# Patient Record
Sex: Female | Born: 1970 | Race: Black or African American | Hispanic: No | Marital: Married | Smoking: Never smoker
Health system: Southern US, Community
[De-identification: ages and names within clinical notes are randomized; demographics above are authoritative.]

## PROBLEM LIST (undated history)

## (undated) ENCOUNTER — Emergency Department (HOSPITAL_COMMUNITY): Admission: EM | Payer: BC Managed Care – PPO | Source: Home / Self Care

## (undated) DIAGNOSIS — D649 Anemia, unspecified: Secondary | ICD-10-CM

## (undated) DIAGNOSIS — D219 Benign neoplasm of connective and other soft tissue, unspecified: Secondary | ICD-10-CM

## (undated) DIAGNOSIS — J45909 Unspecified asthma, uncomplicated: Secondary | ICD-10-CM

## (undated) DIAGNOSIS — Z9109 Other allergy status, other than to drugs and biological substances: Secondary | ICD-10-CM

## (undated) HISTORY — PX: LEG SURGERY: SHX1003

---

## 2014-08-19 ENCOUNTER — Emergency Department (INDEPENDENT_AMBULATORY_CARE_PROVIDER_SITE_OTHER)
Admission: EM | Admit: 2014-08-19 | Discharge: 2014-08-19 | Disposition: A | Discharge status: Home / Self Care | Payer: PRIVATE HEALTH INSURANCE | Source: Home / Self Care | Attending: Family Medicine | Admitting: Family Medicine

## 2014-08-19 ENCOUNTER — Encounter (HOSPITAL_COMMUNITY): Payer: Self-pay | Admitting: Emergency Medicine

## 2014-08-19 DIAGNOSIS — R21 Rash and other nonspecific skin eruption: Secondary | ICD-10-CM

## 2014-08-19 HISTORY — DX: Other allergy status, other than to drugs and biological substances: Z91.09

## 2014-08-19 MED ORDER — HYDROCORTISONE 2.5 % EX CREA: TOPICAL_CREAM | Freq: Two times a day (BID) | CUTANEOUS | Status: DC

## 2014-08-19 MED ORDER — CETIRIZINE HCL 10 MG PO TABS: 10.0000 mg | ORAL_TABLET | Freq: Every day | ORAL | Status: DC

## 2014-08-19 NOTE — ED Provider Notes (Signed)
CSN: [id number]     Arrival date & time 08/19/14  1458 History   First MD Initiated Contact with Patient 08/19/14 1542     Chief Complaint  Patient presents with  . Rash   (Consider location/radiation/quality/duration/timing/severity/associated sxs/prior Treatment) HPI  Rash:  4 years ago developed an "allergy" when imigrated to [place] after fleeing [place]. Became so severe that she was unable to open eyes. Associated w/ allergies. Rash started on face adn neck, no further spread. Pt went to the doctor in [place] and was treated w/ a cream w/ resolution. Pts concern today is that this may become worse. Started to flare again 3-4 days ago. Daughter w/ similar symptoms.   No involvement of the hadns or feet. No others affected at home.    History reviewed. No pertinent past medical history. History reviewed. No pertinent past surgical history. History reviewed. No pertinent family history. History  Substance Use Topics  . Smoking status: Never Smoker   . Smokeless tobacco: Not on file  . Alcohol Use: No   OB History    No data available     Review of Systems Per HPI with all other pertinent systems negative.    Allergies  Review of patient's allergies indicates no known allergies.  Home Medications   Prior to Admission medications   Medication Sig Start Date End Date Taking? Authorizing Provider  cetirizine (ZYRTEC) 10 MG tablet Take 1 tablet (10 mg total) by mouth daily. 08/19/14   Waldemar Dickens, MD  hydrocortisone 2.5 % cream Apply topically 2 (two) times daily. 08/19/14   Waldemar Dickens, MD   BP 117/69 mmHg  Pulse 76  Temp(Src) 98.8 F (37.1 C) (Oral)  Resp 16  SpO2 100%  LMP  (LMP Unknown) Physical Exam  Constitutional: She is oriented to person, place, and time. She appears well-developed and well-nourished. No distress.  HENT:  Head: Normocephalic and atraumatic.  Eyes: EOM are normal. Pupils are equal, round, and reactive to light.  Neck: Normal range of  motion.  Cardiovascular: Normal rate and normal heart sounds.   No murmur heard. Pulmonary/Chest: Effort normal and breath sounds normal.  Abdominal: Soft. She exhibits no distension.  Musculoskeletal: Normal range of motion. She exhibits no edema or tenderness.  Neurological: She is alert and oriented to person, place, and time.  Skin: She is not diaphoretic.  Face w/ few scattered rough patches. No macula or vesicles. Neck and chest clear  Psychiatric: She has a normal mood and affect. Her behavior is normal. Thought content normal.    ED Course  Procedures (including critical care time) Labs Review Labs Reviewed - No data to display  Imaging Review No results found.   MDM   1. Rash    Likely allergic but may be due to eczema.  Scabies is also a possibliity given recent migration to [place] and being in contact w/ numerous other people. Less likely though given multiple othe rfamily members including husband w/o rash.  Hydrocortisone 2% cream Zyrtec F/u adn establish care at HD or [facility] center.  Precautions given and all questions answered  Linna Darner, MD Family Medicine 08/19/2014, 4:42 PM      Waldemar Dickens, MD 08/19/14 [phone]

## 2014-08-19 NOTE — Discharge Instructions (Signed)
Your symptoms are likely from an allergic reaction to your suroundings and eczema, but may be related to scabies.  Please start taking the pill and using the cream. If your symptoms do not improve, consider being seen for scabies. Please know that the cream may temporarily lighten your skin. Only use the skin for 1 week at a time and then give your skin a 1 week break. Please consider calling the Health Department or the [facility] and [facility] [phone]) for further follow-up.   Vos symptmes sont susceptibles d'une raction allergique  vos suroundings et l'eczma , Rae Roam peuvent tre lies  la [place] . S'il vous plat commencer  prendre la pilule et [employer] crme . Si vos symptmes ne amliorent pas , envisager d'tre vu pour [employer] . S'il vous plat savoir que la crme Product/process development scientist votre peau. Utiliser seulement la peau pendant 1 semaine  la fois , puis donner  votre peau une pause de 1 semaine. S'il vous plat envisager d'appeler le ministre de la sant ou de la [facility] [phone]) pour plus de suivi .

## 2014-08-19 NOTE — ED Notes (Signed)
C/o rash on face and bilateral eye irritation x 3 days.  Denies fever, n/v/d

## 2014-10-01 ENCOUNTER — Emergency Department (INDEPENDENT_AMBULATORY_CARE_PROVIDER_SITE_OTHER)
Admission: EM | Admit: 2014-10-01 | Discharge: 2014-10-01 | Disposition: A | Discharge status: Home / Self Care | Payer: PRIVATE HEALTH INSURANCE | Source: Home / Self Care | Attending: Family Medicine | Admitting: Family Medicine

## 2014-10-01 ENCOUNTER — Encounter (HOSPITAL_COMMUNITY): Payer: Self-pay | Admitting: Emergency Medicine

## 2014-10-01 DIAGNOSIS — J069 Acute upper respiratory infection, unspecified: Secondary | ICD-10-CM

## 2014-10-01 LAB — POCT RAPID STREP A: Streptococcus, Group A Screen (Direct): NEGATIVE

## 2014-10-01 MED ORDER — IPRATROPIUM BROMIDE 0.06 % NA SOLN: 2.0000 | Freq: Four times a day (QID) | NASAL | Status: DC

## 2014-10-01 MED ORDER — DICLOFENAC SODIUM 50 MG PO TBEC: 50.0000 mg | DELAYED_RELEASE_TABLET | Freq: Two times a day (BID) | ORAL | Status: DC | PRN

## 2014-10-01 MED ORDER — METHYLPREDNISOLONE ACETATE 80 MG/ML IJ SUSP
80.0000 mg | Freq: Once | INTRAMUSCULAR | Status: AC
  Administered 2014-10-01: 80 mg via INTRAMUSCULAR

## 2014-10-01 MED ORDER — METHYLPREDNISOLONE ACETATE 80 MG/ML IJ SUSP
INTRAMUSCULAR | Status: AC
  Filled 2014-10-01: qty 1

## 2014-10-01 NOTE — ED Provider Notes (Addendum)
Karen Gibbs is a 44 y.o. female who presents to Urgent Care today for sore throat fever headache and fatigue. Symptoms present for 4 days. No vomiting diarrhea abdominal pain or chest pain. Patient has not tried any medications yet.    Past Medical History  Diagnosis Date  . Environmental allergies    History reviewed. No pertinent past surgical history. History  Substance Use Topics  . Smoking status: Never Smoker   . Smokeless tobacco: Not on file  . Alcohol Use: No   ROS as above Medications: No current facility-administered medications for this encounter.   Current Outpatient Prescriptions  Medication Sig Dispense Refill  . diclofenac (VOLTAREN) 50 MG EC tablet Take 1 tablet (50 mg total) by mouth 2 (two) times daily as needed. 60 tablet 0  . ipratropium (ATROVENT) 0.06 % nasal spray Place 2 sprays into both nostrils 4 (four) times daily. 15 mL 1  . [DISCONTINUED] cetirizine (ZYRTEC) 10 MG tablet Take 1 tablet (10 mg total) by mouth daily. 30 tablet 0   No Known Allergies   Exam:  BP 122/73 mmHg  Pulse 78  Temp(Src) 97.9 F (36.6 C) (Oral)  Resp 14  SpO2 100% Gen: Well NAD nontoxic appearing HEENT: EOMI,  MMM normal posterior pharynx and tympanic membranes. Lungs: Normal work of breathing. CTABL Heart: RRR no MRG Abd: NABS, Soft. Nondistended, Nontender Exts: Brisk capillary refill, warm and well perfused.   Results for orders placed or performed during the hospital encounter of 10/01/14 (from the past 24 hour(s))  POCT rapid strep A [facility])     Status: None   Collection Time: 10/01/14 10:57 AM  Result Value Ref Range   Streptococcus, Group A Screen (Direct) NEGATIVE NEGATIVE   No results found.  Assessment and Plan: 44 y.o. female with viral URI. Treatment with Atrovent nasal spray. We'll also use diclofenac for pain control. Will give 80mg  depomedrol prior to discharge.   Discussed warning signs or symptoms. Please see discharge instructions.  Patient expresses understanding.     Gregor Hams, MD 10/01/14 Karen Alannah Averhart, MD 10/01/14 1153

## 2014-10-01 NOTE — ED Notes (Signed)
C/o cold sx onset 4 days Reports HA, fevers, ST, fatigue, productive cough Denies n/v/d Alert, no signs of acute distress.

## 2014-10-01 NOTE — Discharge Instructions (Signed)
°  Revenez si vous ne Hydrographic surveyor. Allez  la salle d'urgence si vous obtenez pire. Utilisez le nez pulvriser toutes les quatre heures. Prenez les pilules deux fois par jour pour [employer] et la fivre et des maux de tte.

## 2014-10-03 LAB — CULTURE, GROUP A STREP

## 2014-10-08 ENCOUNTER — Other Ambulatory Visit: Payer: Self-pay | Admitting: Internal Medicine

## 2014-10-08 ENCOUNTER — Encounter: Payer: Self-pay | Admitting: Internal Medicine

## 2014-10-08 ENCOUNTER — Ambulatory Visit: Payer: Medicaid Other | Attending: Internal Medicine | Admitting: Internal Medicine

## 2014-10-08 DIAGNOSIS — N938 Other specified abnormal uterine and vaginal bleeding: Secondary | ICD-10-CM | POA: Diagnosis not present

## 2014-10-08 DIAGNOSIS — N644 Mastodynia: Secondary | ICD-10-CM | POA: Diagnosis not present

## 2014-10-08 LAB — POCT URINALYSIS DIPSTICK
Bilirubin, UA: NEGATIVE
Blood, UA: NEGATIVE
Glucose, UA: NEGATIVE
Ketones, UA: NEGATIVE
Leukocytes, UA: NEGATIVE
Nitrite, UA: NEGATIVE
Protein, UA: NEGATIVE
Spec Grav, UA: 1.02
Urobilinogen, UA: 0.2
pH, UA: 7

## 2014-10-08 LAB — COMPLETE METABOLIC PANEL WITH GFR
ALT: 14 U/L (ref 0–35)
AST: 23 U/L (ref 0–37)
Albumin: 3.8 g/dL (ref 3.5–5.2)
Alkaline Phosphatase: 37 U/L — ABNORMAL LOW (ref 39–117)
BUN: 12 mg/dL (ref 6–23)
CO2: 28 mEq/L (ref 19–32)
Calcium: 8.9 mg/dL (ref 8.4–10.5)
Chloride: 104 mEq/L (ref 96–112)
Creat: 0.5 mg/dL (ref 0.50–1.10)
GFR, Est African American: >89 mL/min
GFR, Est Non African American: >89 mL/min
Glucose, Bld: 64 mg/dL — ABNORMAL LOW (ref 70–99)
Potassium: 4.2 mEq/L (ref 3.5–5.3)
Sodium: 138 mEq/L (ref 135–145)
Total Bilirubin: 0.7 mg/dL (ref 0.2–1.2)
Total Protein: 6.6 g/dL (ref 6.0–8.3)

## 2014-10-08 LAB — CBC WITH DIFFERENTIAL/PLATELET
Basophils Absolute: 0.1 10*3/uL (ref 0.0–0.1)
Basophils Relative: 1 % (ref 0–1)
Eosinophils Absolute: 0.1 10*3/uL (ref 0.0–0.7)
Eosinophils Relative: 2 % (ref 0–5)
HCT: 33.9 % — ABNORMAL LOW (ref 36.0–46.0)
Hemoglobin: 10.2 g/dL — ABNORMAL LOW (ref 12.0–15.0)
Lymphocytes Relative: 49 % — ABNORMAL HIGH (ref 12–46)
Lymphs Abs: 3.1 10*3/uL (ref 0.7–4.0)
MCH: 21.7 pg — ABNORMAL LOW (ref 26.0–34.0)
MCHC: 30.1 g/dL (ref 30.0–36.0)
MCV: 72.3 fL — ABNORMAL LOW (ref 78.0–100.0)
MPV: 10.4 fL (ref 8.6–12.4)
Monocytes Absolute: 0.5 10*3/uL (ref 0.1–1.0)
Monocytes Relative: 8 % (ref 3–12)
Neutro Abs: 2.6 10*3/uL (ref 1.7–7.7)
Neutrophils Relative %: 40 % — ABNORMAL LOW (ref 43–77)
Platelets: 256 10*3/uL (ref 150–400)
RBC: 4.69 MIL/uL (ref 3.87–5.11)
RDW: 17.2 % — ABNORMAL HIGH (ref 11.5–15.5)
WBC: 6.4 10*3/uL (ref 4.0–10.5)

## 2014-10-08 NOTE — Progress Notes (Signed)
Pt is here today to establish care. Pt states that for about 2 month her left breast has been very painful. Pt also claims that her stomach hurts. Pt has an interpreter.

## 2014-10-08 NOTE — Progress Notes (Signed)
Patient ID: Karen Gibbs, female   DOB: 08/30/1971, 44 y.o.   MRN: [id number]  DDU:[id number]  [id number]  DOB - [birth year]  CC:  Chief Complaint  Patient presents with  . Establish Care       HPI: Karen Gibbs is a 44 y.o. female here today to establish medical care.  [place] two [place] ago. She has no past medical history.  She presents to clinic today for evaluation of left breast pain that she has had since 2007.  She reports that she was on antibiotic in the past for her breast but is unsure why.  She know that she had a "rock" in her breast and some swelling. She has had a "pus" nipple discharge since 2010. Patient reports that she had all of these symptoms while in [place] but was never sent for breast imaging.  Patient states that her mother has a history of "breast disease" but is unsure of exact complications.  She has been taking Naproxen for pain. Patient also complains of abdominal pain. She reports irregular bleeding monthly.  She notes heavy cycles with multiple clot passage.     No Known Allergies Past Medical History  Diagnosis Date  . Environmental allergies    Current Outpatient Prescriptions on File Prior to Visit  Medication Sig Dispense Refill  . ipratropium (ATROVENT) 0.06 % nasal spray Place 2 sprays into both nostrils 4 (four) times daily. 15 mL 1  . diclofenac (VOLTAREN) 50 MG EC tablet Take 1 tablet (50 mg total) by mouth 2 (two) times daily as needed. 60 tablet 0  . [DISCONTINUED] cetirizine (ZYRTEC) 10 MG tablet Take 1 tablet (10 mg total) by mouth daily. 30 tablet 0   No current facility-administered medications on file prior to visit.   History reviewed. No pertinent family history. History   Social History  . Marital Status: Married    Spouse Name: N/A    Number of Children: N/A  . Years of Education: N/A   Occupational History  . Not on file.   Social History Main Topics  . Smoking status: Never Smoker   .  Smokeless tobacco: Not on file  . Alcohol Use: No  . Drug Use: No  . Sexual Activity: Yes   Other Topics Concern  . Not on file   Social History Narrative    Review of Systems: See HPI   Objective:   Filed Vitals:   10/08/14 1159  BP: 118/80  Pulse: 70  Temp: 98 F (36.7 C)  Resp: 16    Physical Exam  Cardiovascular: Normal rate, regular rhythm and normal heart sounds.   Pulmonary/Chest: Effort normal and breath sounds normal. Right breast exhibits mass. Right breast exhibits no nipple discharge and no tenderness. Left breast exhibits mass. Left breast exhibits no tenderness. There is no breast swelling.  Abdominal: Soft. Bowel sounds are normal. There is tenderness.     No results found for: WBC, HGB, HCT, MCV, PLT No results found for: CREATININE, BUN, NA, K, CL, CO2  No results found for: HGBA1C Lipid Panel  No results found for: CHOL, TRIG, HDL, CHOLHDL, VLDL, LDLCALC     Assessment and plan:   Orla was seen today for establish care.  Diagnoses and all orders for this visit:  Breast pain, left Orders: -     MM Digital Diagnostic Bilat; Future  DUB (dysfunctional uterine bleeding) Orders: -     POCT urinalysis dipstick -     US  Abdomen Complete; Future -     Cancel: US OB Transvaginal; Future -     CBC with Differential -     US Pelvis Complete; Future -     US Transvaginal Non-OB; Future -     COMPLETE METABOLIC PANEL WITH GFR -     TSH   Interpreter was used to communicate directly with patient for the entire encounter including providing detailed patient instructions.    Return if symptoms worsen or fail to improve.    Chari Manning, NP-C [facility] [phone] 10/08/2014, 12:19 PM

## 2014-10-09 LAB — TSH: TSH: 1.376 u[IU]/mL (ref 0.350–4.500)

## 2014-10-13 ENCOUNTER — Ambulatory Visit
Admission: RE | Admit: 2014-10-13 | Discharge: 2014-10-13 | Disposition: A | Discharge status: Home / Self Care | Payer: Medicaid Other | Source: Ambulatory Visit | Attending: Internal Medicine | Admitting: Internal Medicine

## 2014-10-13 ENCOUNTER — Other Ambulatory Visit: Payer: Self-pay | Admitting: Internal Medicine

## 2014-10-13 DIAGNOSIS — N644 Mastodynia: Secondary | ICD-10-CM

## 2014-10-15 ENCOUNTER — Ambulatory Visit (HOSPITAL_COMMUNITY)
Admission: RE | Admit: 2014-10-15 | Discharge: 2014-10-15 | Disposition: A | Discharge status: Home / Self Care | Payer: Medicaid Other | Source: Ambulatory Visit | Attending: Internal Medicine | Admitting: Internal Medicine

## 2014-10-15 DIAGNOSIS — N938 Other specified abnormal uterine and vaginal bleeding: Secondary | ICD-10-CM

## 2014-10-15 DIAGNOSIS — N920 Excessive and frequent menstruation with regular cycle: Secondary | ICD-10-CM | POA: Diagnosis present

## 2014-10-15 DIAGNOSIS — N852 Hypertrophy of uterus: Secondary | ICD-10-CM | POA: Insufficient documentation

## 2014-10-15 DIAGNOSIS — R938 Abnormal findings on diagnostic imaging of other specified body structures: Secondary | ICD-10-CM | POA: Diagnosis not present

## 2014-10-19 ENCOUNTER — Telehealth: Payer: Self-pay | Admitting: *Deleted

## 2014-10-19 DIAGNOSIS — N921 Excessive and frequent menstruation with irregular cycle: Secondary | ICD-10-CM

## 2014-10-19 NOTE — Telephone Encounter (Signed)
-----   Message from Lance Bosch, NP sent at 10/15/2014 10:45 PM EST ----- . Ultrasound negative for lesions or fibroids. If bleeding persist she may require referral to GYN

## 2014-10-19 NOTE — Telephone Encounter (Signed)
Used [employer] [place] # [id number]  Pt aware of results Stated want to be referral to GYN  Continue with heavy bleeding and pain with menses

## 2014-10-19 NOTE — Telephone Encounter (Signed)
May send referral.

## 2014-10-19 NOTE — Telephone Encounter (Signed)
-----   Message from Lance Bosch, NP sent at 10/15/2014 10:49 PM EST ----- Probable lymph node found. Looks non cancerous but she will need a repeat ultrasound in 6 months of right breast

## 2014-10-20 NOTE — Telephone Encounter (Signed)
Referral placed.

## 2014-10-20 NOTE — Addendum Note (Signed)
Addended by: Betti Cruz on: 10/20/2014 04:14 PM   Modules accepted: Orders

## 2014-10-21 ENCOUNTER — Encounter: Payer: Self-pay | Admitting: Obstetrics & Gynecology

## 2014-10-25 ENCOUNTER — Telehealth: Payer: Self-pay | Admitting: Emergency Medicine

## 2014-10-25 MED ORDER — FERROUS SULFATE 325 (65 FE) MG PO TABS: 325.0000 mg | ORAL_TABLET | Freq: Every day | ORAL | Status: DC

## 2014-10-25 NOTE — Telephone Encounter (Signed)
Pt given lab results with medication instructions to start taking Ferrous Sulfate 325 mg tab po daily  Medication e-scribed to [place] interpretor used for [place] language [phone]

## 2014-10-25 NOTE — Telephone Encounter (Signed)
-----   Message from Lance Bosch, NP sent at 10/25/2014 10:55 AM EST ----- Iron deficiency anemia. Please send ferrous sulfate 325 mg PO daily. Send 2 refills. Explain that medication will help with blood levels and may improve energy levels.

## 2014-11-25 ENCOUNTER — Encounter: Payer: Self-pay | Admitting: Obstetrics & Gynecology

## 2014-11-25 ENCOUNTER — Other Ambulatory Visit (HOSPITAL_COMMUNITY)
Admission: RE | Admit: 2014-11-25 | Discharge: 2014-11-25 | Disposition: A | Discharge status: Home / Self Care | Payer: Medicaid Other | Source: Ambulatory Visit | Attending: Obstetrics & Gynecology | Admitting: Obstetrics & Gynecology

## 2014-11-25 ENCOUNTER — Ambulatory Visit (INDEPENDENT_AMBULATORY_CARE_PROVIDER_SITE_OTHER): Payer: PRIVATE HEALTH INSURANCE | Admitting: Obstetrics & Gynecology

## 2014-11-25 VITALS — BP 123/61 | HR 67 | Temp 97.9°F | Ht 61.81 in | Wt 173.7 lb

## 2014-11-25 DIAGNOSIS — Z01419 Encounter for gynecological examination (general) (routine) without abnormal findings: Secondary | ICD-10-CM | POA: Diagnosis not present

## 2014-11-25 DIAGNOSIS — Z1151 Encounter for screening for human papillomavirus (HPV): Secondary | ICD-10-CM | POA: Diagnosis present

## 2014-11-25 DIAGNOSIS — N920 Excessive and frequent menstruation with regular cycle: Secondary | ICD-10-CM | POA: Diagnosis not present

## 2014-11-25 DIAGNOSIS — N946 Dysmenorrhea, unspecified: Secondary | ICD-10-CM | POA: Insufficient documentation

## 2014-11-25 MED ORDER — IBUPROFEN 800 MG PO TABS: 800.0000 mg | ORAL_TABLET | Freq: Three times a day (TID) | ORAL | Status: DC | PRN

## 2014-11-25 NOTE — Progress Notes (Signed)
Patient ID: Karen Gibbs, female   DOB: 25-Jun-1971, 44 y.o.   MRN: [id number]  Chief Complaint  Patient presents with  . Referral  . Menorrhagia    for last year    HPI Karen Gibbs is a 44 y.o. female.  G4P4,Patient's last menstrual period was 11/18/2014. Heavy painful periods. On no meds, being followed for abnl mammogram  HPI  Past Medical History  Diagnosis Date  . Environmental allergies     Past Surgical History  Procedure Laterality Date  . Cesarean section      No family history on file.  Social History History  Substance Use Topics  . Smoking status: Never Smoker   . Smokeless tobacco: Not on file  . Alcohol Use: No    No Known Allergies  Current Outpatient Prescriptions  Medication Sig Dispense Refill  . ferrous sulfate 325 (65 FE) MG tablet Take 1 tablet (325 mg total) by mouth daily with breakfast. 30 tablet 2  . [DISCONTINUED] cetirizine (ZYRTEC) 10 MG tablet Take 1 tablet (10 mg total) by mouth daily. 30 tablet 0   No current facility-administered medications for this visit.    Review of Systems Review of Systems  Constitutional: Negative.   Genitourinary: Positive for frequency, menstrual problem and pelvic pain. Negative for dysuria, urgency, hematuria, vaginal bleeding and vaginal discharge.    Blood pressure 123/61, pulse 67, temperature 97.9 F (36.6 C), temperature source Oral, height 5' 1.81" (1.57 m), weight 173 lb 11.2 oz (78.79 kg), last menstrual period 11/18/2014.  Physical Exam Physical Exam  Constitutional: She is oriented to person, place, and time. She appears well-developed. No distress.  Pulmonary/Chest: Effort normal. No respiratory distress.  Genitourinary: Vagina normal and uterus normal. No vaginal discharge found.  Pap done, mild tenderness left adnexa no mass  Neurological: She is alert and oriented to person, place, and time.  Psychiatric: She has a normal mood and affect. Her behavior is normal.    Data  Reviewed  CLINICAL DATA: Prolonged periods with pelvic pain  EXAM: TRANSABDOMINAL AND TRANSVAGINAL ULTRASOUND OF PELVIS  TECHNIQUE: Both transabdominal and transvaginal ultrasound examinations of the pelvis were performed. Transabdominal technique was performed for global imaging of the pelvis including uterus, ovaries, adnexal regions, and pelvic cul-de-sac. It was necessary to proceed with endovaginal exam following the transabdominal exam to visualize the uterus and adnexal structures.  COMPARISON: None  FINDINGS: Uterus  Measurements: 12.7 x 5.8 x 7.4 cm. No fibroids or other mass visualized.  Endometrium  Thickness: Variable thickness up to 20 mm. No focal abnormality visualized.  Right ovary  Measurements: 3.7 x 3.5 x 2.3 cm. Normal appearance/no adnexal mass.  Left ovary  Measurements: 3.7 x 1.9 x 3.7 cm. Normal appearance/no adnexal mass.  Other findings  No free fluid.  IMPRESSION: 1. Mild uterine enlargement without sonographic evidence of leiomyomas. There is endometrial thickening to 20 mm. No endometrial fluid collections or polyps are demonstrated. If bleeding remains unresponsive to hormonal or medical therapy, focal lesion work-up with sonohysterogram should be considered. Endometrial biopsy should also be considered in pre-menopausal patients at high risk for endometrial carcinoma. (Ref: Radiological Reasoning: Algorithmic Workup of Abnormal Vaginal Bleeding with Endovaginal Sonography and Sonohysterography. AJR 2008; 465:K81-27) 2. The ovaries are normal in size and echotexture.   Electronically Signed  By: David Martinique  On: 10/15/2014 10:20        Assessment    Menorrhagia dysmenorrhea     Plan    Wants to avoid hormones, will Rx Ibuprofen 800  mg TID during menses and RTC 3 months        Gregoria Selvy 11/25/2014, 2:33 PM

## 2014-11-25 NOTE — Progress Notes (Signed)
Fabienne Bruns used for interpreter

## 2014-11-25 NOTE — Patient Instructions (Signed)

## 2014-11-29 LAB — CYTOLOGY - PAP

## 2014-12-16 ENCOUNTER — Other Ambulatory Visit: Payer: Self-pay | Admitting: Infectious Disease

## 2014-12-16 ENCOUNTER — Ambulatory Visit
Admission: RE | Admit: 2014-12-16 | Discharge: 2014-12-16 | Disposition: A | Discharge status: Home / Self Care | Payer: No Typology Code available for payment source | Source: Ambulatory Visit | Attending: Infectious Disease | Admitting: Infectious Disease

## 2014-12-16 DIAGNOSIS — R7611 Nonspecific reaction to tuberculin skin test without active tuberculosis: Secondary | ICD-10-CM

## 2014-12-27 ENCOUNTER — Encounter: Payer: Self-pay | Admitting: Family Medicine

## 2014-12-27 ENCOUNTER — Ambulatory Visit: Payer: Medicaid Other | Attending: Internal Medicine | Admitting: Family Medicine

## 2014-12-27 VITALS — BP 126/81 | HR 82 | Temp 98.0°F | Resp 18 | Ht 62.5 in | Wt 180.8 lb

## 2014-12-27 DIAGNOSIS — X58XXXA Exposure to other specified factors, initial encounter: Secondary | ICD-10-CM | POA: Diagnosis not present

## 2014-12-27 DIAGNOSIS — R21 Rash and other nonspecific skin eruption: Secondary | ICD-10-CM | POA: Diagnosis present

## 2014-12-27 DIAGNOSIS — H578 Other specified disorders of eye and adnexa: Secondary | ICD-10-CM | POA: Insufficient documentation

## 2014-12-27 DIAGNOSIS — T7840XA Allergy, unspecified, initial encounter: Secondary | ICD-10-CM | POA: Insufficient documentation

## 2014-12-27 DIAGNOSIS — T7840XD Allergy, unspecified, subsequent encounter: Secondary | ICD-10-CM | POA: Diagnosis not present

## 2014-12-27 MED ORDER — FEXOFENADINE HCL 180 MG PO TABS: 180.0000 mg | ORAL_TABLET | Freq: Every day | ORAL | Status: DC

## 2014-12-27 NOTE — Progress Notes (Signed)
Patient presents with itchy, watery eyes since 12/24/14. Patient also complaining that her face has been itching since 12/24/14 as well. [employer] used for translation # [id number]

## 2014-12-27 NOTE — Patient Instructions (Signed)
May use over the counter allery eye drops like Visine alllergy or Opthcon allergy May use over the counter hydrocortisone cream twice a day for 2 weeks. If fexofenadine not working well may use benadryl over the counter Follow-up as needed

## 2014-12-27 NOTE — Progress Notes (Signed)
Subjective:     Patient ID: Karen Gibbs, female   DOB: 30-Sep-1970, 44 y.o.   MRN: [id number]  HPI   Patient presents with a several day history of "rash" on face and neck and watery itchy eyes. She had a previous episode of this in December. She has not determine what might be the cause. She did no find betamethzone and Zyrtec helpful but it did resolve but has returned recently. She denies any swelling or difficulty breathing.   Review of Systems See HPI    Objective:   Physical Exam She is alert, oriented, appropriate, in do distress. Not being familiar with her skin, I am finding no significant rash on her face or neck. Her conjunctiva is not injected.    Assessment:     Allergic reaction    Plan:     Trial of Allegra 180 mg. 0ne po q day. May use OTC allergy eye dropss If allegra not sufficient, may discontinue and use benadryl per bottle instructions.

## 2015-01-10 ENCOUNTER — Ambulatory Visit: Payer: Medicaid Other | Attending: Internal Medicine | Admitting: Internal Medicine

## 2015-01-10 ENCOUNTER — Encounter: Payer: Self-pay | Admitting: Internal Medicine

## 2015-01-10 VITALS — BP 129/83 | HR 73 | Temp 98.3°F | Resp 16

## 2015-01-10 DIAGNOSIS — R21 Rash and other nonspecific skin eruption: Secondary | ICD-10-CM | POA: Insufficient documentation

## 2015-01-10 DIAGNOSIS — J302 Other seasonal allergic rhinitis: Secondary | ICD-10-CM | POA: Insufficient documentation

## 2015-01-10 MED ORDER — LORATADINE 10 MG PO TABS: 10.0000 mg | ORAL_TABLET | Freq: Every day | ORAL | Status: DC

## 2015-01-10 NOTE — Patient Instructions (Signed)
Stop Fexpfenadine and switch to claritin

## 2015-01-10 NOTE — Progress Notes (Signed)
Patient ID: Karen Gibbs, female   DOB: 09-13-1970, 44 y.o.   MRN: [id number]  CC: facial rash/itching  HPI: Karen Gibbs is a 44 y.o. female here today for a follow up visit of a facial rash.  Patient was seen 2 weeks ago for the same facial rash and again 5 months ago. Patient reports that she developed this "allergy" 4 years ago when she migrated to the [place] from [place]. Two weeks ago the patient was told to try allegra once per day and use OTC eye drops. She has not noticed much relief with allegra and continues to report itching. Allegra makes her sleepy for most of the day.    No Known Allergies Past Medical History  Diagnosis Date  . Environmental allergies    Current Outpatient Prescriptions on File Prior to Visit  Medication Sig Dispense Refill  . fexofenadine (ALLEGRA) 180 MG tablet Take 1 tablet (180 mg total) by mouth daily. 30 tablet 1  . ferrous sulfate 325 (65 FE) MG tablet Take 1 tablet (325 mg total) by mouth daily with breakfast. (Patient not taking: Reported on 12/27/2014) 30 tablet 2  . ibuprofen (ADVIL,MOTRIN) 800 MG tablet Take 1 tablet (800 mg total) by mouth every 8 (eight) hours as needed for cramping (during menses). (Patient not taking: Reported on 01/10/2015) 50 tablet 2  . [DISCONTINUED] cetirizine (ZYRTEC) 10 MG tablet Take 1 tablet (10 mg total) by mouth daily. 30 tablet 0   No current facility-administered medications on file prior to visit.   History reviewed. No pertinent family history. History   Social History  . Marital Status: Married    Spouse Name: N/A  . Number of Children: N/A  . Years of Education: N/A   Occupational History  . Not on file.   Social History Main Topics  . Smoking status: Never Smoker   . Smokeless tobacco: Not on file  . Alcohol Use: No  . Drug Use: No  . Sexual Activity: Yes   Other Topics Concern  . Not on file   Social History Narrative    Review of Systems  Eyes:       Eye itching, skin  darkening around eyes  Skin: Positive for itching and rash.  All other systems reviewed and are negative.     Objective:   Filed Vitals:   01/10/15 1415  BP: 129/83  Pulse: 73  Temp: 98.3 F (36.8 C)  Resp: 16    Physical Exam  Constitutional: She is oriented to person, place, and time.  Eyes:  Allergic shiners bilateral   Cardiovascular: Normal rate, regular rhythm and normal heart sounds.   Pulmonary/Chest: Effort normal and breath sounds normal.  Neurological: She is alert and oriented to person, place, and time.  Skin: Skin is warm and dry.     Lab Results  Component Value Date   WBC 6.4 10/08/2014   HGB 10.2* 10/08/2014   HCT 33.9* 10/08/2014   MCV 72.3* 10/08/2014   PLT 256 10/08/2014   Lab Results  Component Value Date   CREATININE 0.50 10/08/2014   BUN 12 10/08/2014   NA 138 10/08/2014   K 4.2 10/08/2014   CL 104 10/08/2014   CO2 28 10/08/2014    No results found for: HGBA1C Lipid Panel  No results found for: CHOL, TRIG, HDL, CHOLHDL, VLDL, LDLCALC     Assessment and plan:   Karen Gibbs was seen today for follow-up.  Diagnoses and all orders for this visit:  Facial rash Orders: -  Ambulatory referral to Allergy  Seasonal allergies Orders: -     Ambulatory referral to Ophthalmology -     loratadine (CLARITIN) 10 MG tablet; Take 1 tablet (10 mg total) by mouth daily. Discontinue allegra due to drowsiness and switch to claritin.   Due to language barrier, an interpreter was present during the history-taking and subsequent discussion (and for part of the physical exam) with this patient.  Return if symptoms worsen or fail to improve.       Chari Manning, NP-C [facility] [phone] 01/10/2015, 2:24 PM

## 2015-01-10 NOTE — Progress Notes (Signed)
Pt is here today c/o of a rash on her face that is very itchy. Her allergy's are still acting up but not as bad as before. Interpreter [place]

## 2015-02-01 ENCOUNTER — Emergency Department (HOSPITAL_COMMUNITY)
Admission: EM | Admit: 2015-02-01 | Discharge: 2015-02-01 | Discharge status: Absent without leave | Payer: Self-pay | Source: Home / Self Care

## 2015-02-01 ENCOUNTER — Emergency Department (INDEPENDENT_AMBULATORY_CARE_PROVIDER_SITE_OTHER)
Admission: EM | Admit: 2015-02-01 | Discharge: 2015-02-01 | Disposition: A | Discharge status: Home / Self Care | Payer: PRIVATE HEALTH INSURANCE | Source: Home / Self Care | Attending: Family Medicine | Admitting: Family Medicine

## 2015-02-01 ENCOUNTER — Encounter (HOSPITAL_COMMUNITY): Payer: Self-pay | Admitting: *Deleted

## 2015-02-01 DIAGNOSIS — J36 Peritonsillar abscess: Secondary | ICD-10-CM

## 2015-02-01 LAB — POCT RAPID STREP A: Streptococcus, Group A Screen (Direct): NEGATIVE

## 2015-02-01 NOTE — ED Notes (Signed)
Pt  Has  sorethroat   With  Pain  r  Side  Neck       Hurts    To  Swallow         With   Swelling         Noted    Pt     Reports  The   Symptoms  X  3  Days

## 2015-02-01 NOTE — Discharge Instructions (Signed)
Go at 1pm to office for further care.

## 2015-02-01 NOTE — ED Notes (Signed)
Instructed to    [place]   Discussed    With pt

## 2015-02-01 NOTE — ED Provider Notes (Signed)
CSN: [id number]     Arrival date & time 02/01/15  9924 History   First MD Initiated Contact with Patient 02/01/15 1057     Chief Complaint  Patient presents with  . Neck Pain   (Consider location/radiation/quality/duration/timing/severity/associated sxs/prior Treatment) Patient is a 44 y.o. female presenting with neck pain. The history is provided by the patient and the spouse.  Neck Pain Pain location:  R side Quality:  Shooting Pain radiates to:  Does not radiate Pain severity:  Moderate Onset quality:  Gradual Duration:  3 days Progression:  Worsening Chronicity:  New Relieved by:  None tried Worsened by:  Nothing tried Ineffective treatments:  None tried Associated symptoms: no fever     Past Medical History  Diagnosis Date  . Environmental allergies    Past Surgical History  Procedure Laterality Date  . Cesarean section     No family history on file. History  Substance Use Topics  . Smoking status: Never Smoker   . Smokeless tobacco: Not on file  . Alcohol Use: No   OB History    Gravida Para Term Preterm AB TAB SAB Ectopic Multiple Living   4 4 4  0 0 0 0 0 0 4     Review of Systems  Constitutional: Positive for chills and appetite change. Negative for fever.  HENT: Positive for sore throat and trouble swallowing.   Musculoskeletal: Positive for neck pain.    Allergies  Review of patient's allergies indicates no known allergies.  Home Medications   Prior to Admission medications   Medication Sig Start Date End Date Taking? Authorizing Provider  ferrous sulfate 325 (65 FE) MG tablet Take 1 tablet (325 mg total) by mouth daily with breakfast. Patient not taking: Reported on 12/27/2014 10/25/14   Lance Bosch, NP  fexofenadine (ALLEGRA) 180 MG tablet Take 1 tablet (180 mg total) by mouth daily. 12/27/14   Micheline Chapman, NP  ibuprofen (ADVIL,MOTRIN) 800 MG tablet Take 1 tablet (800 mg total) by mouth every 8 (eight) hours as needed for cramping (during  menses). Patient not taking: Reported on 01/10/2015 11/25/14   Woodroe Mode, MD  loratadine (CLARITIN) 10 MG tablet Take 1 tablet (10 mg total) by mouth daily. 01/10/15   Lance Bosch, NP   BP 127/84 mmHg  Pulse 87  Temp(Src) 99.4 F (37.4 C) (Oral)  Resp 16  SpO2 100%  LMP 01/06/2015 Physical Exam  Constitutional: She appears well-developed and well-nourished.  HENT:  Right Ear: External ear normal.  Left Ear: External ear normal.  Mouth/Throat: Mucous membranes are normal. Oropharyngeal exudate and posterior oropharyngeal erythema present. No posterior oropharyngeal edema.    Eyes: Conjunctivae are normal. Pupils are equal, round, and reactive to light.  Neck: Normal range of motion. Neck supple.  Lymphadenopathy:    She has cervical adenopathy.  Nursing note and vitals reviewed.   ED Course  Procedures (including critical care time) Labs Review Labs Reviewed  POCT RAPID STREP A    Imaging Review No results found.   MDM   1. Tonsillar abscess    Discussed with dr Erik Obey --will see today.    Billy Fischer, MD 02/01/15 [phone]

## 2015-02-03 LAB — CULTURE, GROUP A STREP: Strep A Culture: NEGATIVE

## 2015-03-15 ENCOUNTER — Other Ambulatory Visit: Payer: Self-pay | Admitting: Internal Medicine

## 2015-03-15 DIAGNOSIS — N6310 Unspecified lump in the right breast, unspecified quadrant: Secondary | ICD-10-CM

## 2015-04-07 ENCOUNTER — Institutional Professional Consult (permissible substitution): Payer: Medicaid Other | Admitting: Internal Medicine

## 2015-04-08 ENCOUNTER — Telehealth: Payer: Self-pay | Admitting: Internal Medicine

## 2015-04-08 NOTE — Telephone Encounter (Signed)
[facility] called stating that their appointments are booked out until December, they recommended for the patient to be referred to Dr. Allena Katz at [facility] [place]. Ph: [phone].

## 2015-04-15 ENCOUNTER — Other Ambulatory Visit: Payer: Medicaid Other

## 2015-04-28 ENCOUNTER — Ambulatory Visit
Admission: RE | Admit: 2015-04-28 | Discharge: 2015-04-28 | Disposition: A | Discharge status: Home / Self Care | Payer: Medicaid Other | Source: Ambulatory Visit | Attending: Internal Medicine | Admitting: Internal Medicine

## 2015-04-28 DIAGNOSIS — N6310 Unspecified lump in the right breast, unspecified quadrant: Secondary | ICD-10-CM

## 2015-06-05 ENCOUNTER — Emergency Department (INDEPENDENT_AMBULATORY_CARE_PROVIDER_SITE_OTHER)
Admission: EM | Admit: 2015-06-05 | Discharge: 2015-06-05 | Disposition: A | Discharge status: Home / Self Care | Payer: PRIVATE HEALTH INSURANCE | Source: Home / Self Care | Attending: Emergency Medicine | Admitting: Emergency Medicine

## 2015-06-05 ENCOUNTER — Encounter (HOSPITAL_COMMUNITY): Payer: Self-pay | Admitting: Emergency Medicine

## 2015-06-05 DIAGNOSIS — J039 Acute tonsillitis, unspecified: Secondary | ICD-10-CM | POA: Diagnosis not present

## 2015-06-05 LAB — POCT RAPID STREP A: Streptococcus, Group A Screen (Direct): NEGATIVE

## 2015-06-05 MED ORDER — AMOXICILLIN 500 MG PO CAPS: 500.0000 mg | ORAL_CAPSULE | Freq: Three times a day (TID) | ORAL | Status: DC

## 2015-06-05 NOTE — ED Provider Notes (Signed)
CSN: [id number]     Arrival date & time 06/05/15  1437 History   First MD Initiated Contact with Patient 06/05/15 1553     No chief complaint on file.  (Consider location/radiation/quality/duration/timing/severity/associated sxs/prior Treatment) Patient is a 44 y.o. female presenting with pharyngitis. The history is provided by the patient. No language interpreter was used.  Sore Throat This is a new problem. The current episode started more than 2 days ago. The problem occurs constantly. The problem has been gradually worsening. Pertinent negatives include no shortness of breath. Nothing aggravates the symptoms. Nothing relieves the symptoms. She has tried nothing for the symptoms. The treatment provided no relief.  Pt reports she has had a sore throat for several days.  Pt has had multiple in the past.  Pt reports her Md has advised her to see and ENt but they have not scheduled.  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 Normal chest xray in august.  Past Medical History  Diagnosis Date  . Environmental allergies    Past Surgical History  Procedure Laterality Date  . Cesarean section     No family history on file. Social History  Substance Use Topics  . Smoking status: Never Smoker   . Smokeless tobacco: None  . Alcohol Use: No   OB History    Gravida Para Term Preterm AB TAB SAB Ectopic Multiple Living   4 4 4  0 0 0 0 0 0 4     Review of Systems  Respiratory: Negative for shortness of breath.   All other systems reviewed and are negative.   Allergies  Review of patient's allergies indicates no known allergies.  Home Medications   Prior to Admission medications   Medication Sig Start Date End Date Taking? Authorizing Provider  amoxicillin (AMOXIL) 500 MG capsule Take 1 capsule (500 mg total) by mouth 3 (three) times daily. 06/05/15   Fransico Meadow, PA-C  ferrous sulfate 325 (65 FE) MG tablet Take 1 tablet (325 mg total) by mouth daily with  breakfast. Patient not taking: Reported on 12/27/2014 10/25/14   Lance Bosch, NP  fexofenadine (ALLEGRA) 180 MG tablet Take 1 tablet (180 mg total) by mouth daily. 12/27/14   Micheline Chapman, NP  ibuprofen (ADVIL,MOTRIN) 800 MG tablet Take 1 tablet (800 mg total) by mouth every 8 (eight) hours as needed for cramping (during menses). Patient not taking: Reported on 01/10/2015 11/25/14   Woodroe Mode, MD  loratadine (CLARITIN) 10 MG tablet Take 1 tablet (10 mg total) by mouth daily. 01/10/15   Lance Bosch, NP   Meds Ordered and Administered this Visit  Medications - No data to display  BP 127/86 mmHg  Pulse 84  Temp(Src) 99 F (37.2 C) (Oral)  Resp 16  SpO2 100%  LMP 05/31/2015 No data found.   Physical Exam  Constitutional: She is oriented to person, place, and time. She appears well-developed and well-nourished.  HENT:  Head: Normocephalic and atraumatic.  Swollen tonsils erythema  Eyes: EOM are normal. Pupils are equal, round, and reactive to light.  Neck: Normal range of motion.  Cardiovascular: Normal rate and normal heart sounds.   Pulmonary/Chest: Effort normal.  Abdominal: Soft. She exhibits no distension.  Musculoskeletal: Normal range of motion.  Neurological: She is alert and oriented to person, place, and time.  Psychiatric: She has a normal mood and affect.  Nursing note and vitals reviewed.   ED Course  Procedures (including critical care  time)  Labs Review Labs Reviewed - No data to display  Imaging Review No results found.   Visual Acuity Review  Right Eye Distance:   Left Eye Distance:   Bilateral Distance:    Right Eye Near:   Left Eye Near:    Bilateral Near:         MDM   1. Tonsillitis    amoxicillian Schedule to see Dr. Franchot Gallo for evaluation   Fransico Meadow, PA-C 06/05/15 1616

## 2015-06-05 NOTE — ED Notes (Signed)
Pt here with c/o sore throat, pain with swallowing, fever, chills Daughter interpretor for [facility] language Rapid strep obtained Temp 99

## 2015-06-05 NOTE — Discharge Instructions (Signed)

## 2015-06-07 LAB — CULTURE, GROUP A STREP: Strep A Culture: NEGATIVE

## 2015-06-09 ENCOUNTER — Emergency Department (HOSPITAL_COMMUNITY)
Admission: EM | Admit: 2015-06-09 | Discharge: 2015-06-09 | Disposition: A | Discharge status: Home / Self Care | Payer: Medicaid Other | Attending: Emergency Medicine | Admitting: Emergency Medicine

## 2015-06-09 ENCOUNTER — Encounter (HOSPITAL_COMMUNITY): Payer: Self-pay

## 2015-06-09 DIAGNOSIS — Y999 Unspecified external cause status: Secondary | ICD-10-CM | POA: Insufficient documentation

## 2015-06-09 DIAGNOSIS — Z8611 Personal history of tuberculosis: Secondary | ICD-10-CM | POA: Insufficient documentation

## 2015-06-09 DIAGNOSIS — X58XXXA Exposure to other specified factors, initial encounter: Secondary | ICD-10-CM | POA: Insufficient documentation

## 2015-06-09 DIAGNOSIS — T7840XA Allergy, unspecified, initial encounter: Secondary | ICD-10-CM | POA: Insufficient documentation

## 2015-06-09 DIAGNOSIS — H5713 Ocular pain, bilateral: Secondary | ICD-10-CM | POA: Diagnosis present

## 2015-06-09 DIAGNOSIS — Z792 Long term (current) use of antibiotics: Secondary | ICD-10-CM | POA: Insufficient documentation

## 2015-06-09 DIAGNOSIS — Y939 Activity, unspecified: Secondary | ICD-10-CM | POA: Insufficient documentation

## 2015-06-09 DIAGNOSIS — Y929 Unspecified place or not applicable: Secondary | ICD-10-CM | POA: Insufficient documentation

## 2015-06-09 DIAGNOSIS — Z79899 Other long term (current) drug therapy: Secondary | ICD-10-CM | POA: Insufficient documentation

## 2015-06-09 MED ORDER — PREDNISONE 50 MG PO TABS: ORAL_TABLET | ORAL | Status: DC

## 2015-06-09 MED ORDER — LOTEPREDNOL ETABONATE 0.5 % OP SUSP: 1.0000 [drp] | Freq: Four times a day (QID) | OPHTHALMIC | Status: DC

## 2015-06-09 NOTE — Discharge Instructions (Signed)
Prescription for eyedrops and prednisone. Return if worse.

## 2015-06-09 NOTE — ED Notes (Signed)
Declined W/C at D/C and was escorted to lobby by RN. 

## 2015-06-09 NOTE — ED Provider Notes (Signed)
CSN: [id number]     Arrival date & time 06/09/15  0940 History  By signing my name below, I, Karen Gibbs, attest that this documentation has been prepared under the direction and in the presence of No att. providers found. Electronically Signed: Erling Gibbs, ED Scribe. 06/09/2015. 10:43 AM.    Chief Complaint  Patient presents with  . Facial Swelling    The history is provided by the patient. A language interpreter was used [place]).    HPI Comments: Karen Gibbs is a 44 y.o. female with a h/o environmental allergies who presents to the Emergency Department complaining of moderate, bilateral upper eye lid swelling onset 1 day ago. She reports associated bilateral eye pain, itchy eyes and watery drainage. She endorses that it difficult to open her eyes due to the swelling. She has not taken anything for her symptoms. Pt reports nothing is making the swelling better. She notes she began taking amoxicillin, 4 days ago, for an infection as well as a medication for tuberculosis for over 1 month. She reports when she came to the [place] she has a blood test done and she was found positive for the bacteria that causes TB and advised to take medication for mgmt. Pt denies wearing contact lenses. She denies any blurred vision.   Past Medical History  Diagnosis Date  . Environmental allergies    Past Surgical History  Procedure Laterality Date  . Cesarean section     No family history on file. Social History  Substance Use Topics  . Smoking status: Never Smoker   . Smokeless tobacco: None  . Alcohol Use: No   OB History    Gravida Para Term Preterm AB TAB SAB Ectopic Multiple Living   4 4 4  0 0 0 0 0 0 4     Review of Systems    Allergies  Review of patient's allergies indicates no known allergies.  Home Medications   Prior to Admission medications   Medication Sig Start Date End Date Taking? Authorizing Provider  amoxicillin (AMOXIL) 500 MG capsule Take 1 capsule  (500 mg total) by mouth 3 (three) times daily. 06/05/15   Fransico Meadow, PA-C  ferrous sulfate 325 (65 FE) MG tablet Take 1 tablet (325 mg total) by mouth daily with breakfast. Patient not taking: Reported on 12/27/2014 10/25/14   Lance Bosch, NP  fexofenadine (ALLEGRA) 180 MG tablet Take 1 tablet (180 mg total) by mouth daily. 12/27/14   Micheline Chapman, NP  ibuprofen (ADVIL,MOTRIN) 800 MG tablet Take 1 tablet (800 mg total) by mouth every 8 (eight) hours as needed for cramping (during menses). Patient not taking: Reported on 01/10/2015 11/25/14   Woodroe Mode, MD  loratadine (CLARITIN) 10 MG tablet Take 1 tablet (10 mg total) by mouth daily. 01/10/15   Lance Bosch, NP  loteprednol (LOTEMAX) 0.5 % ophthalmic suspension Place 1 drop into both eyes 4 (four) times daily. 06/09/15   Nat Christen, MD  predniSONE (DELTASONE) 50 MG tablet One tab daily for 6 days 06/09/15   Nat Christen, MD   Triage Vitals: BP 95/74 mmHg  Pulse 75  Temp(Src) 98 F (36.7 C) (Oral)  Resp 18  Ht 5\' 4"  (1.626 m)  Wt 180 lb (81.647 kg)  BMI 30.88 kg/m2  SpO2 100%  LMP 05/31/2015  Physical Exam  Constitutional: She is oriented to person, place, and time. She appears well-developed and well-nourished.  HENT:  Head: Normocephalic and atraumatic.  Eyes: Conjunctivae and EOM  are normal. Pupils are equal, round, and reactive to light.  Bilateral upper > than lower lid edema with some clear eye drainage  Neck: Normal range of motion. Neck supple.  Cardiovascular: Regular rhythm.   Pulmonary/Chest: Breath sounds normal.  Musculoskeletal: Normal range of motion.  Neurological: She is alert and oriented to person, place, and time.  Skin: Skin is warm and dry.  Psychiatric: She has a normal mood and affect. Her behavior is normal.  Nursing note and vitals reviewed.   ED Course  Procedures (including critical care time)  DIAGNOSTIC STUDIES: Oxygen Saturation is 100% on RA, normal by my interpretation.    COORDINATION  OF CARE:  10:43 AM- Will give rx for eye drops as well as    Labs Review Labs Reviewed - No data to display  Imaging Review No results found. I have personally reviewed and evaluated these images and lab results as part of my medical decision-making.   EKG Interpretation None      MDM   Final diagnoses:  Allergic reaction, initial encounter   Stranding physical consistent with allergic phenomenon. I do not think it is related to her amoxicillin since it is localized in her eyelids only. Rx Lotemax ophthalmic and oral prednisone.  I, Demetric Dunnaway, personally performed the services described in this documentation. All medical record entries made by the scribe were at my direction and in my presence.  I have reviewed the chart and discharge instructions and agree that the record reflects my personal performance and is accurate and complete. [name].  06/09/2015. 10:43 AM.     Nat Christen, MD 06/09/15 1044

## 2015-06-09 NOTE — ED Notes (Signed)
Pt. Has bilateral upper eye lids swelling, no drainage . Denies any pain or blurred vision.  Very itchy began yesterday while working at her computer.

## 2015-06-13 NOTE — ED Notes (Signed)
Final report of strep testing negative. No further action required

## 2015-09-28 ENCOUNTER — Other Ambulatory Visit: Payer: Self-pay

## 2015-09-28 ENCOUNTER — Other Ambulatory Visit: Payer: Self-pay | Admitting: Internal Medicine

## 2015-09-28 DIAGNOSIS — N63 Unspecified lump in unspecified breast: Secondary | ICD-10-CM

## 2015-10-17 ENCOUNTER — Other Ambulatory Visit: Payer: Self-pay | Admitting: Internal Medicine

## 2015-10-18 ENCOUNTER — Other Ambulatory Visit: Payer: Medicaid Other

## 2015-11-15 ENCOUNTER — Ambulatory Visit
Admission: RE | Admit: 2015-11-15 | Discharge: 2015-11-15 | Disposition: A | Discharge status: Home / Self Care | Payer: Medicaid Other | Source: Ambulatory Visit | Attending: Internal Medicine | Admitting: Internal Medicine

## 2015-11-15 DIAGNOSIS — N63 Unspecified lump in unspecified breast: Secondary | ICD-10-CM

## 2016-01-11 ENCOUNTER — Emergency Department (HOSPITAL_COMMUNITY): Payer: Medicaid Other

## 2016-01-11 ENCOUNTER — Encounter (HOSPITAL_COMMUNITY): Payer: Self-pay | Admitting: Nurse Practitioner

## 2016-01-11 ENCOUNTER — Emergency Department (HOSPITAL_COMMUNITY)
Admission: EM | Admit: 2016-01-11 | Discharge: 2016-01-11 | Disposition: A | Discharge status: Home / Self Care | Payer: Medicaid Other | Attending: Emergency Medicine | Admitting: Emergency Medicine

## 2016-01-11 DIAGNOSIS — Z3202 Encounter for pregnancy test, result negative: Secondary | ICD-10-CM | POA: Diagnosis not present

## 2016-01-11 DIAGNOSIS — Z792 Long term (current) use of antibiotics: Secondary | ICD-10-CM | POA: Diagnosis not present

## 2016-01-11 DIAGNOSIS — R51 Headache: Secondary | ICD-10-CM | POA: Diagnosis not present

## 2016-01-11 DIAGNOSIS — Z79899 Other long term (current) drug therapy: Secondary | ICD-10-CM | POA: Insufficient documentation

## 2016-01-11 DIAGNOSIS — R110 Nausea: Secondary | ICD-10-CM | POA: Diagnosis not present

## 2016-01-11 DIAGNOSIS — R519 Headache, unspecified: Secondary | ICD-10-CM

## 2016-01-11 LAB — I-STAT BETA HCG BLOOD, ED (MC, WL, AP ONLY): I-stat hCG, quantitative: <5 m[IU]/mL (ref <5)

## 2016-01-11 LAB — I-STAT CREATININE, ED: Creatinine, Ser: 0.5 mg/dL (ref 0.44–1.00)

## 2016-01-11 MED ORDER — PROCHLORPERAZINE EDISYLATE 5 MG/ML IJ SOLN
10.0000 mg | Freq: Once | INTRAMUSCULAR | Status: AC
  Administered 2016-01-11: 10 mg via INTRAVENOUS
  Filled 2016-01-11: qty 2

## 2016-01-11 MED ORDER — SODIUM CHLORIDE 0.9 % IV BOLUS (SEPSIS)
500.0000 mL | Freq: Once | INTRAVENOUS | Status: AC
Start: 2016-01-11 — End: 2016-01-11
  Administered 2016-01-11: 500 mL via INTRAVENOUS

## 2016-01-11 MED ORDER — KETOROLAC TROMETHAMINE 30 MG/ML IJ SOLN
30.0000 mg | Freq: Once | INTRAMUSCULAR | Status: AC
  Administered 2016-01-11: 30 mg via INTRAVENOUS
  Filled 2016-01-11: qty 1

## 2016-01-11 NOTE — ED Provider Notes (Signed)
CSN: [id number]     Arrival date & time 01/11/16  1239 History   First MD Initiated Contact with Patient 01/11/16 1352     Chief Complaint  Patient presents with  . Headache      Patient is a 45 y.o. female presenting with headaches. The history is provided by the patient.  Headache Associated symptoms: nausea   Associated symptoms: no abdominal pain, no back pain, no diarrhea, no eye pain, no neck stiffness, no numbness, no photophobia, no vomiting and no weakness   Patient presents with headache. Throbbing in the front of her head but states it goes to decides not to. Dull. No vision changes. No photophobia. States she's had one episode similarly about 3 months ago. Localizing numbness or weakness was states that she feels weak all over. Denies possibly pregnancy. Pain was not relieved for the pain pill at work. States pain began around 10 AM today which surround 4 hours prior to me seeing her.  Past Medical History  Diagnosis Date  . Environmental allergies    Past Surgical History  Procedure Laterality Date  . Cesarean section     History reviewed. No pertinent family history. Social History  Substance Use Topics  . Smoking status: Never Smoker   . Smokeless tobacco: None  . Alcohol Use: No   OB History    Gravida Para Term Preterm AB TAB SAB Ectopic Multiple Living   4 4 4  0 0 0 0 0 0 4     Review of Systems  Constitutional: Negative for activity change and appetite change.  Eyes: Negative for photophobia, pain and visual disturbance.  Respiratory: Negative for chest tightness and shortness of breath.   Cardiovascular: Negative for chest pain and leg swelling.  Gastrointestinal: Positive for nausea. Negative for vomiting, abdominal pain and diarrhea.  Genitourinary: Negative for flank pain.  Musculoskeletal: Negative for back pain and neck stiffness.  Skin: Negative for rash.  Neurological: Positive for headaches. Negative for weakness and numbness.   Psychiatric/Behavioral: Negative for behavioral problems.      Allergies  Review of patient's allergies indicates no known allergies.  Home Medications   Prior to Admission medications   Medication Sig Start Date End Date Taking? Authorizing Provider  amoxicillin (AMOXIL) 500 MG capsule Take 1 capsule (500 mg total) by mouth 3 (three) times daily. 06/05/15   Fransico Meadow, PA-C  ferrous sulfate 325 (65 FE) MG tablet Take 1 tablet (325 mg total) by mouth daily with breakfast. Patient not taking: Reported on 12/27/2014 10/25/14   Lance Bosch, NP  fexofenadine (ALLEGRA) 180 MG tablet Take 1 tablet (180 mg total) by mouth daily. 12/27/14   Micheline Chapman, NP  ibuprofen (ADVIL,MOTRIN) 800 MG tablet Take 1 tablet (800 mg total) by mouth every 8 (eight) hours as needed for cramping (during menses). Patient not taking: Reported on 01/10/2015 11/25/14   Woodroe Mode, MD  loratadine (CLARITIN) 10 MG tablet Take 1 tablet (10 mg total) by mouth daily. 01/10/15   Lance Bosch, NP  loteprednol (LOTEMAX) 0.5 % ophthalmic suspension Place 1 drop into both eyes 4 (four) times daily. 06/09/15   Nat Christen, MD  predniSONE (DELTASONE) 50 MG tablet One tab daily for 6 days 06/09/15   Nat Christen, MD   BP 128/72 mmHg  Pulse 62  Temp(Src) 98 F (36.7 C) (Oral)  Resp 18  SpO2 99% Physical Exam  Constitutional: She is oriented to person, place, and time. She appears well-developed and  well-nourished.  HENT:  Head: Normocephalic and atraumatic.  Patient has a wet towel over her head.  Eyes: EOM are normal. Pupils are equal, round, and reactive to light.  Neck: Normal range of motion. Neck supple.  Cardiovascular: Normal rate, regular rhythm and normal heart sounds.   Pulmonary/Chest: Effort normal and breath sounds normal.  Abdominal: Soft. There is no tenderness.  Musculoskeletal: Normal range of motion.  Lymphadenopathy:    She has no cervical adenopathy.  Neurological: She is alert and oriented to  person, place, and time. No cranial nerve deficit.  Good grip strength bilaterally.  Skin: Skin is warm.  Psychiatric: Her speech is normal.  Nursing note and vitals reviewed.   ED Course  Procedures (including critical care time) Labs Review Labs Reviewed  CBC WITH DIFFERENTIAL/PLATELET  I-STAT BETA HCG BLOOD, ED ([facility], [facility], [facility] ONLY)  I-STAT CREATININE, ED    Imaging Review Ct Head Wo Contrast  01/11/2016  CLINICAL DATA:  Severe headache since this morning with nausea, initial encounter EXAM: CT HEAD WITHOUT CONTRAST TECHNIQUE: Contiguous axial images were obtained from the base of the skull through the vertex without intravenous contrast. COMPARISON:  None FINDINGS: Normal ventricular morphology. No midline shift or mass effect. Normal appearance of brain parenchyma. No intracranial hemorrhage, mass lesion or evidence acute infarction. No extra-axial fluid collections. Paranasal sinuses and mastoid air cells clear. Bones unremarkable. IMPRESSION: Normal exam. Electronically Signed   By: Lavonia Dana M.D.   On: 01/11/2016 15:18   I have personally reviewed and evaluated these images and lab results as part of my medical decision-making.   EKG Interpretation None      MDM   Final diagnoses:  Acute nonintractable headache, unspecified headache type    Patient with headache. Feels better after treatment. Head CT reassuring. Nonfocal exam. Will discharge home.     Davonna Belling, MD 01/11/16 [phone]

## 2016-01-11 NOTE — ED Notes (Addendum)
She c/o a severe headache since this morning. She reports a similar headache in the past that was relieved by a pain shot in an emergency room. She reports some nausea.  A coworker gave her some pain medication which did not relieve the pain. She is alert and breathing easily, moves all extremities.

## 2016-01-11 NOTE — Discharge Instructions (Signed)

## 2016-09-29 IMAGING — US US TRANSVAGINAL NON-OB
1 series · 13 of 25 positions shown · non-contrast
Comparison: None

CLINICAL DATA: Prolonged periods with pelvic pain

EXAM:
TRANSABDOMINAL AND TRANSVAGINAL ULTRASOUND OF PELVIS
TECHNIQUE: Both transabdominal and transvaginal ultrasound examinations of the
pelvis were performed. Transabdominal technique was performed for
global imaging of the pelvis including uterus, ovaries, adnexal
regions, and pelvic cul-de-sac. It was necessary to proceed with
endovaginal exam following the transabdominal exam to visualize the
uterus and adnexal structures.

[Series 1: us transvaginal non-ob · 0.21mm/px · 13 of 38 slices shown]
[im 1/38]
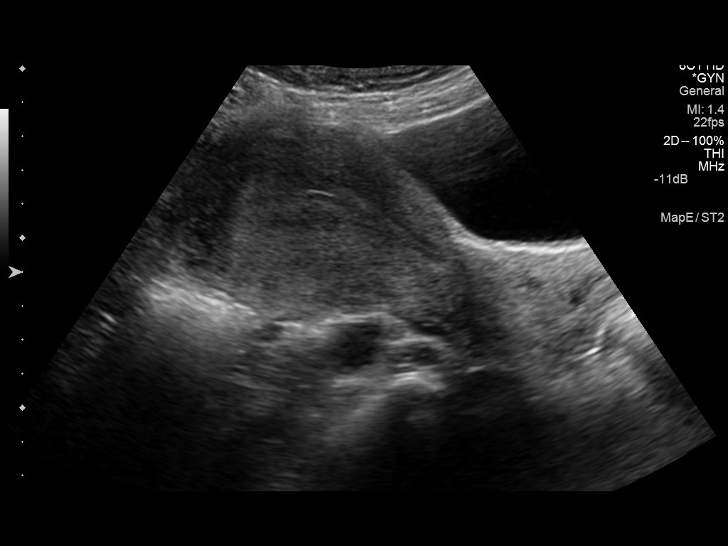
[im 4/38]
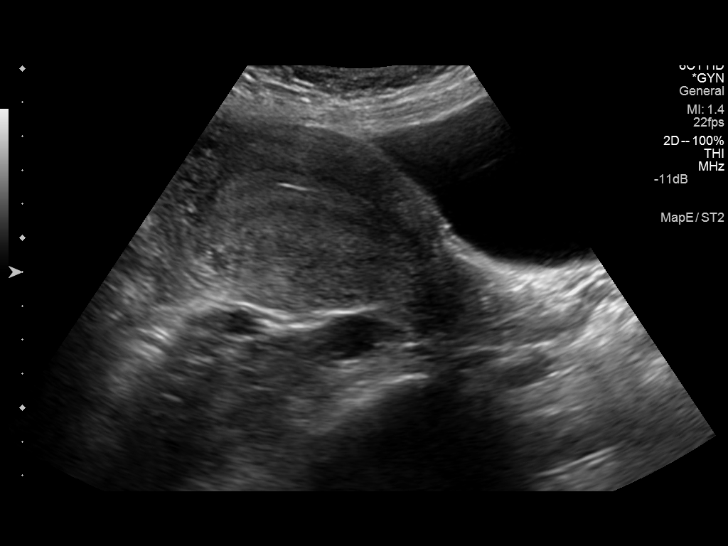
[im 7/38]
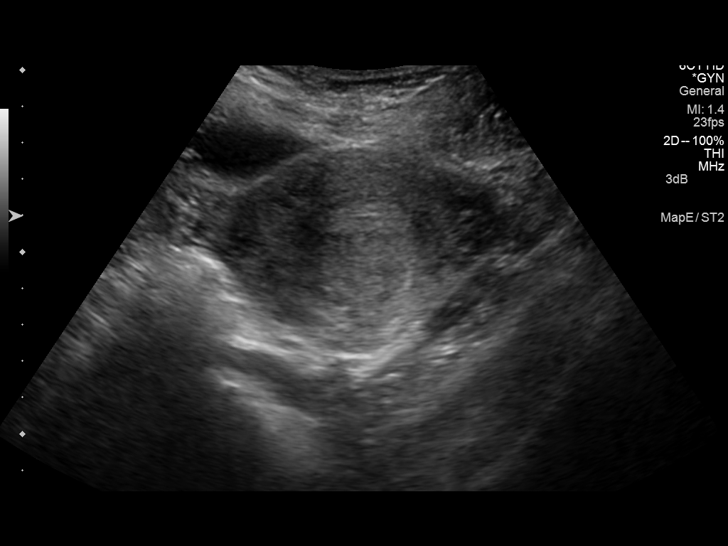
[im 10/38]
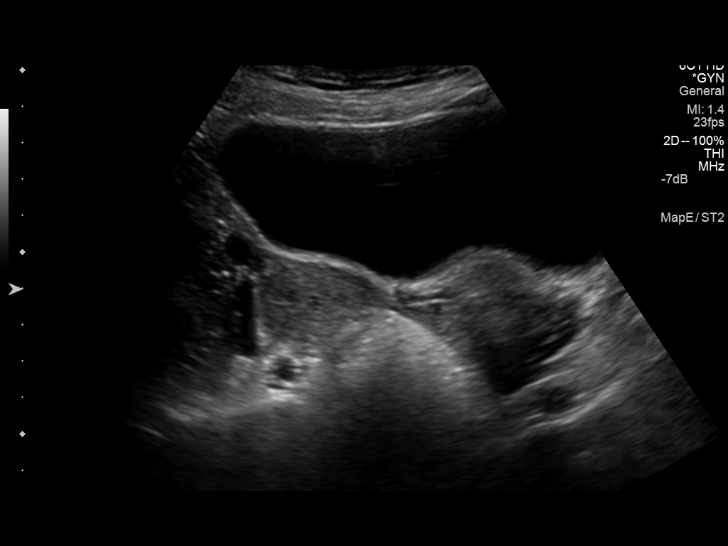
[im 13/38]
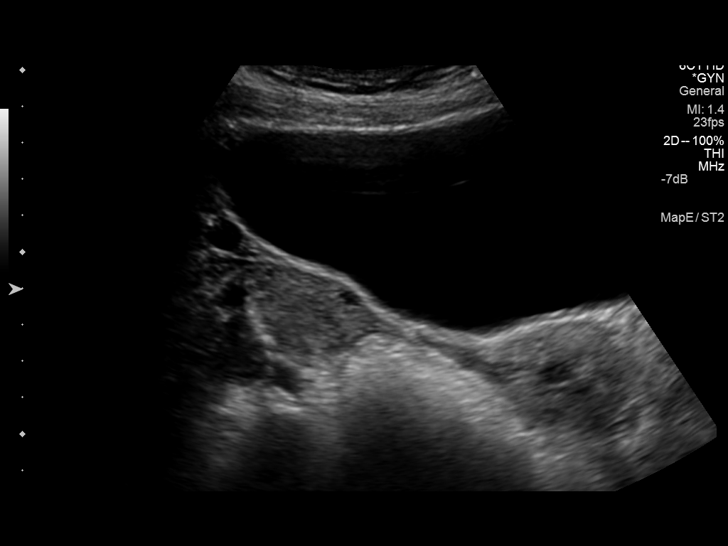
[im 16/38]
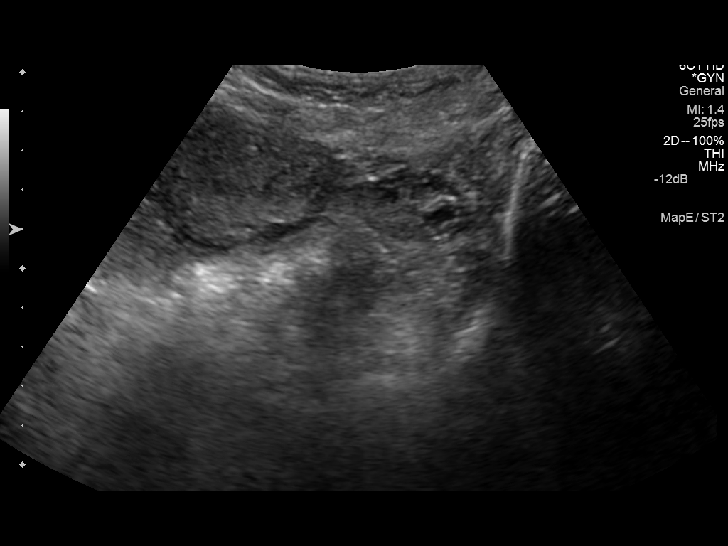
[im 19/38]
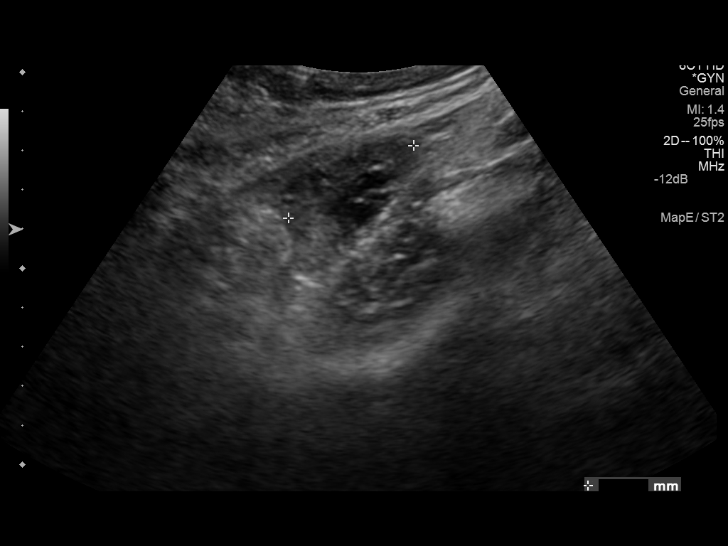
[im 22/38]
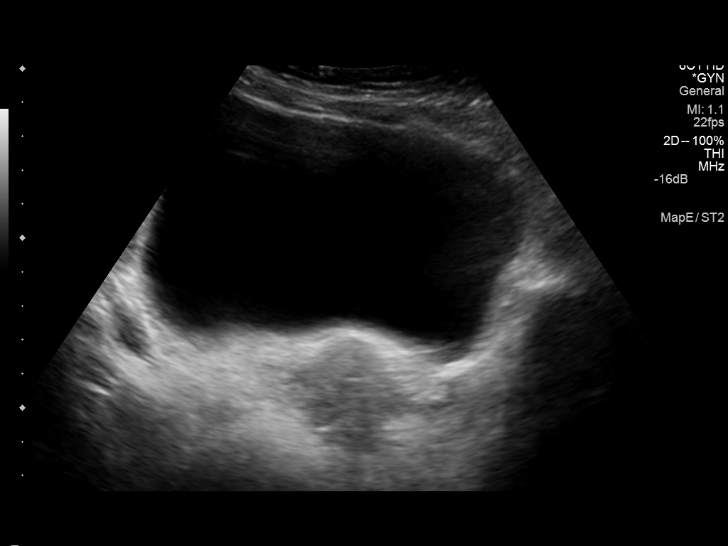
[im 25/38]
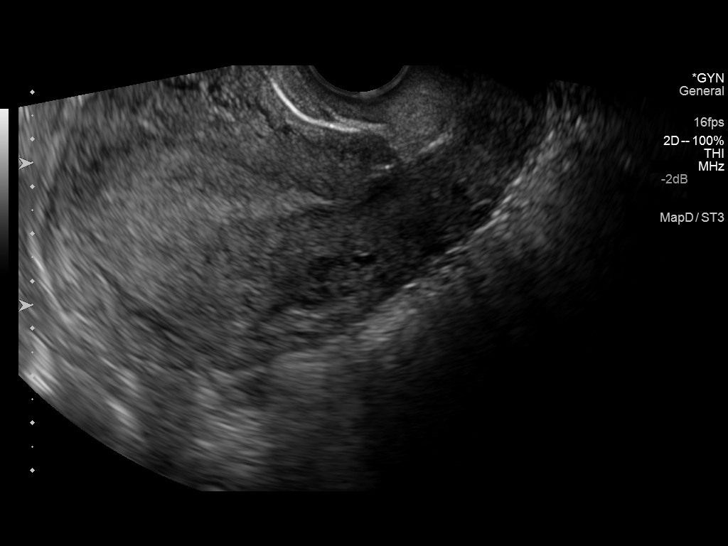
[im 28/38]
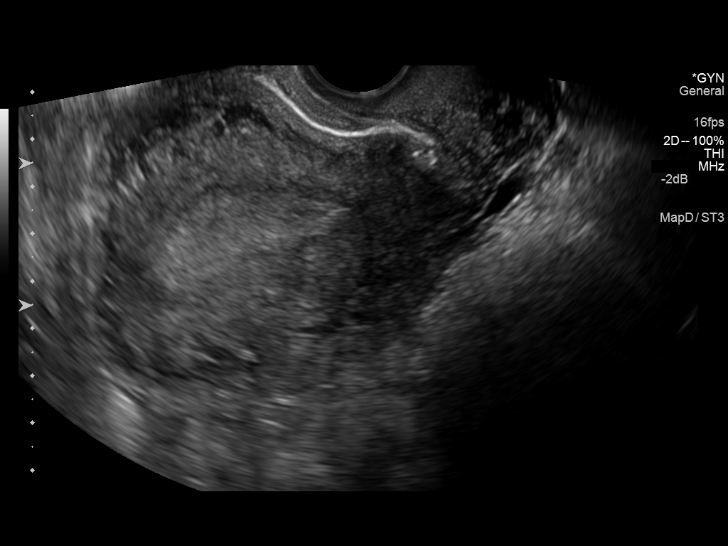
[im 31/38]
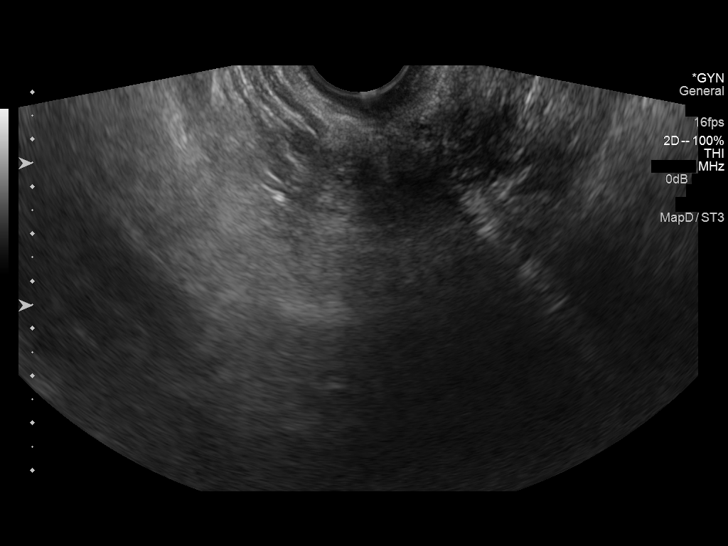
[im 34/38]
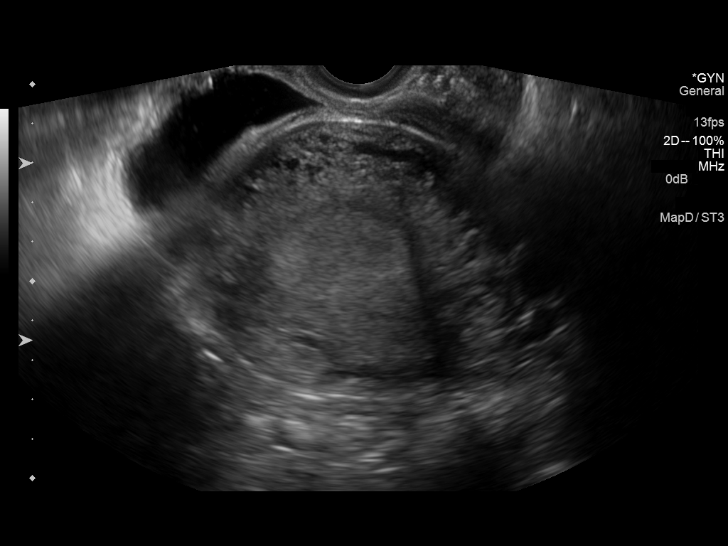
[im 38/38]
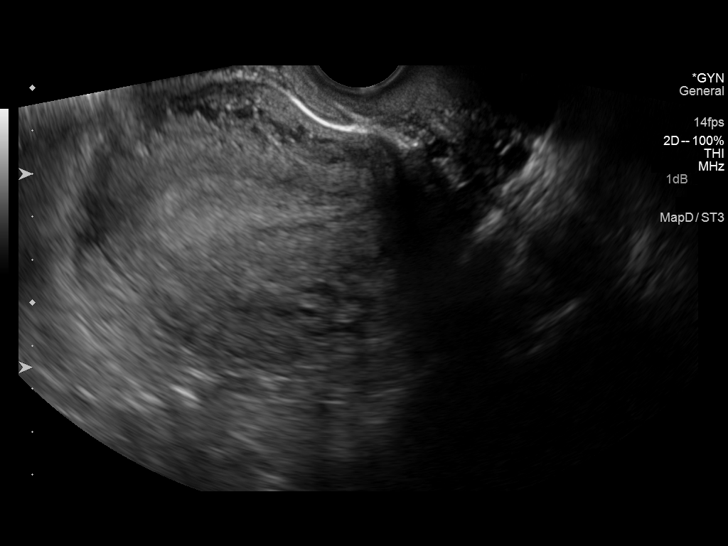

[13 of 25 positions shown; findings below may reference images not displayed]

FINDINGS: Uterus

Measurements: 12.7 x 5.8 x 7.4 cm. No fibroids or other mass
visualized.

Endometrium

Thickness: Variable thickness up to 20 mm. No focal abnormality
visualized.

Right ovary

Measurements: 3.7 x 3.5 x 2.3 cm. Normal appearance/no adnexal mass.

Left ovary

Measurements: 3.7 x 1.9 x 3.7 cm. Normal appearance/no adnexal mass.

Other findings

No free fluid.
IMPRESSION: 1. Mild uterine enlargement without sonographic evidence of
leiomyomas. There is endometrial thickening to 20 mm. No endometrial
fluid collections or polyps are demonstrated. If bleeding remains
unresponsive to hormonal or medical therapy, focal lesion work-up
with sonohysterogram should be considered. Endometrial biopsy should
also be considered in pre-menopausal patients at high risk for
endometrial carcinoma. (Ref: Radiological Reasoning: Algorithmic
Workup of Abnormal Vaginal Bleeding with Endovaginal Sonography and
Sonohysterography. AJR 7669; [id number]:S68-73)
2. The ovaries are normal in size and echotexture.

## 2016-10-25 ENCOUNTER — Other Ambulatory Visit: Payer: Self-pay | Admitting: Internal Medicine

## 2016-11-30 IMAGING — CR DG CHEST 1V
1 series · 1 of 1 positions shown · non-contrast
Comparison: None.

CLINICAL DATA: Positive PPD, no current symptoms

EXAM:
CHEST  1 VIEW

[w chest pa]
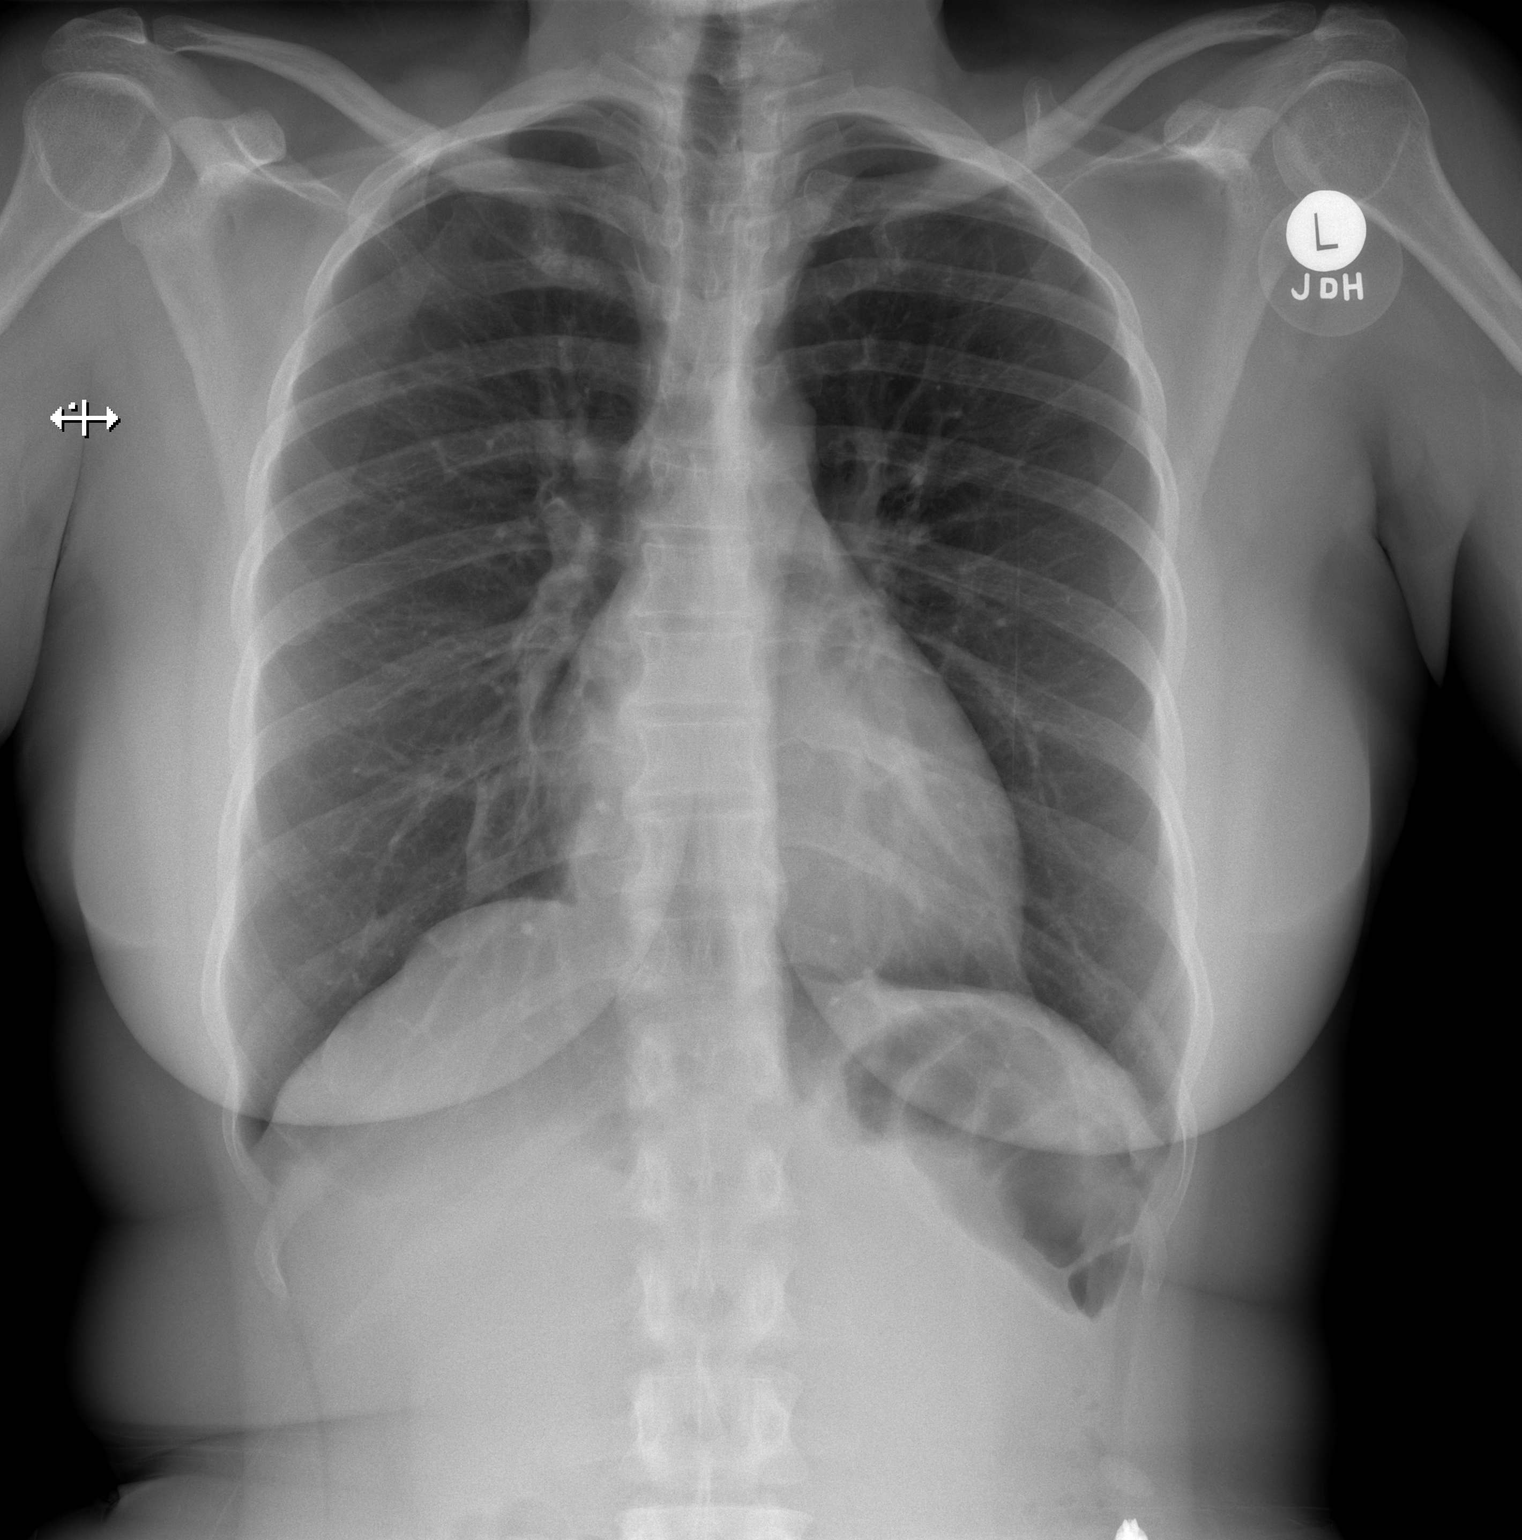

[1 of 1 positions shown; findings below may reference images not displayed]

FINDINGS: A frontal view of the chest shows the lungs to be clear. No sequela
of prior tuberculous infection is seen. Mediastinal and hilar
contours are unremarkable. The heart is within normal limits in
size.
IMPRESSION: No active disease.

## 2017-03-26 ENCOUNTER — Ambulatory Visit: Payer: Medicaid Other | Admitting: Family Medicine

## 2017-04-01 ENCOUNTER — Encounter: Payer: Self-pay | Admitting: Family Medicine

## 2017-04-01 ENCOUNTER — Other Ambulatory Visit: Payer: Self-pay | Admitting: Internal Medicine

## 2017-04-01 ENCOUNTER — Ambulatory Visit: Payer: PRIVATE HEALTH INSURANCE | Attending: Family Medicine | Admitting: Family Medicine

## 2017-04-01 VITALS — BP 104/72 | HR 72 | Temp 98.7°F | Resp 18 | Ht 62.0 in | Wt 207.2 lb

## 2017-04-01 DIAGNOSIS — N644 Mastodynia: Secondary | ICD-10-CM | POA: Insufficient documentation

## 2017-04-01 DIAGNOSIS — N63 Unspecified lump in unspecified breast: Secondary | ICD-10-CM | POA: Insufficient documentation

## 2017-04-01 DIAGNOSIS — Z1239 Encounter for other screening for malignant neoplasm of breast: Secondary | ICD-10-CM

## 2017-04-01 DIAGNOSIS — Z79899 Other long term (current) drug therapy: Secondary | ICD-10-CM | POA: Insufficient documentation

## 2017-04-01 DIAGNOSIS — Z7689 Persons encountering health services in other specified circumstances: Secondary | ICD-10-CM | POA: Insufficient documentation

## 2017-04-01 DIAGNOSIS — Z0000 Encounter for general adult medical examination without abnormal findings: Secondary | ICD-10-CM | POA: Diagnosis not present

## 2017-04-01 MED ORDER — IBUPROFEN 600 MG PO TABS: 600.0000 mg | ORAL_TABLET | Freq: Three times a day (TID) | ORAL | 0 refills | Status: DC | PRN

## 2017-04-01 NOTE — Progress Notes (Signed)
Patient is here for referral to [facility]

## 2017-04-01 NOTE — Progress Notes (Signed)
Subjective:  Patient ID: Karen Gibbs, female    DOB: 08/02/1971  Age: 46 y.o. MRN: [id number]  CC: Establish Care   HPI Karen Gibbs presents to establish care. She is requesting referral for mm screening. She denies any family history of breast or gynecological cancers. She reports her mother has history of breast problem but is unsure of diagnosis. She reports history of breast leaking abscess in 2010. She does reports painful, lumpy breast prior to menstrual cycles only. She does not perform SBE.    Outpatient Medications Prior to Visit  Medication Sig Dispense Refill  . amoxicillin (AMOXIL) 500 MG capsule Take 1 capsule (500 mg total) by mouth 3 (three) times daily. (Patient not taking: Reported on 01/11/2016) 30 capsule 0  . ferrous sulfate 325 (65 FE) MG tablet Take 1 tablet (325 mg total) by mouth daily with breakfast. (Patient not taking: Reported on 12/27/2014) 30 tablet 2  . fexofenadine (ALLEGRA) 180 MG tablet Take 1 tablet (180 mg total) by mouth daily. (Patient not taking: Reported on 01/11/2016) 30 tablet 1  . ibuprofen (ADVIL,MOTRIN) 800 MG tablet Take 1 tablet (800 mg total) by mouth every 8 (eight) hours as needed for cramping (during menses). (Patient not taking: Reported on 01/10/2015) 50 tablet 2  . loratadine (CLARITIN) 10 MG tablet Take 1 tablet (10 mg total) by mouth daily. (Patient not taking: Reported on 01/11/2016) 30 tablet 11  . loteprednol (LOTEMAX) 0.5 % ophthalmic suspension Place 1 drop into both eyes 4 (four) times daily. (Patient not taking: Reported on 01/11/2016) 5 mL 0  . predniSONE (DELTASONE) 50 MG tablet One tab daily for 6 days (Patient not taking: Reported on 01/11/2016) 6 tablet 0   No facility-administered medications prior to visit.     ROS Review of Systems  Constitutional: Negative.   Respiratory: Negative.   Cardiovascular: Negative.   Gastrointestinal: Negative.   Skin: Negative.   Psychiatric/Behavioral: Negative.    Objective:  BP  104/72 (BP Location: Left Arm, Patient Position: Sitting, Cuff Size: Normal)   Pulse 72   Temp 98.7 F (37.1 C) (Oral)   Resp 18   Ht 5\' 2"  (1.575 m)   Wt 207 lb 3.2 oz (94 kg)   SpO2 100%   BMI 37.90 kg/m   BP/Weight 04/01/2017 01/11/2016 18/10/9935  Systolic BP 169 678 95  Diastolic BP 72 77 74  Wt. (Lbs) 207.2 - 180  BMI 37.9 - 30.88     Physical Exam  Constitutional: She appears well-developed and well-nourished.  Neck: No JVD present.  Cardiovascular: Normal rate, regular rhythm, normal heart sounds and intact distal pulses.   Pulmonary/Chest: Effort normal and breath sounds normal. Right breast exhibits no mass, no nipple discharge, no skin change and no tenderness. Left breast exhibits no mass, no nipple discharge, no skin change and no tenderness.  Abdominal: Soft. Bowel sounds are normal.  Skin: Skin is warm and dry.  Psychiatric: She has a normal mood and affect.  Nursing note and vitals reviewed.  Assessment & Plan:   Problem List Items Addressed This Visit    None    Visit Diagnoses    Screening for breast cancer    -  Primary   Relevant Orders   MM SCREENING BREAST TOMO BILATERAL   Healthcare maintenance       Relevant Orders   HIV antibody (with reflex) (Completed)   Painful lumpy breasts       Relevant Medications   ibuprofen (ADVIL,MOTRIN) 600 MG tablet  Meds ordered this encounter  Medications  . ibuprofen (ADVIL,MOTRIN) 600 MG tablet    Sig: Take 1 tablet (600 mg total) by mouth every 8 (eight) hours as needed (Take with food).    Dispense:  30 tablet    Refill:  0    Order Specific Question:   Supervising Provider    Answer:   Tresa Garter [id number]    Follow-up: Return As needed .   Alfonse Spruce FNP

## 2017-04-02 LAB — HIV ANTIBODY (ROUTINE TESTING W REFLEX): HIV Screen 4th Generation wRfx: NONREACTIVE

## 2017-04-09 ENCOUNTER — Other Ambulatory Visit: Payer: Self-pay | Admitting: Obstetrics and Gynecology

## 2017-04-09 DIAGNOSIS — N63 Unspecified lump in unspecified breast: Secondary | ICD-10-CM

## 2017-04-11 ENCOUNTER — Ambulatory Visit
Admission: RE | Admit: 2017-04-11 | Discharge: 2017-04-11 | Disposition: A | Discharge status: Home / Self Care | Payer: Medicaid Other | Source: Ambulatory Visit | Attending: Obstetrics and Gynecology | Admitting: Obstetrics and Gynecology

## 2017-04-11 ENCOUNTER — Ambulatory Visit (HOSPITAL_COMMUNITY): Payer: Self-pay

## 2017-04-11 ENCOUNTER — Encounter (HOSPITAL_COMMUNITY): Payer: Self-pay

## 2017-04-11 ENCOUNTER — Ambulatory Visit
Admission: RE | Admit: 2017-04-11 | Discharge: 2017-04-11 | Disposition: A | Discharge status: Home / Self Care | Payer: No Typology Code available for payment source | Source: Ambulatory Visit | Attending: Obstetrics and Gynecology | Admitting: Obstetrics and Gynecology

## 2017-04-11 ENCOUNTER — Ambulatory Visit (HOSPITAL_COMMUNITY)
Admission: RE | Admit: 2017-04-11 | Discharge: 2017-04-11 | Disposition: A | Discharge status: Home / Self Care | Payer: Medicaid Other | Source: Ambulatory Visit | Attending: Obstetrics and Gynecology | Admitting: Obstetrics and Gynecology

## 2017-04-11 DIAGNOSIS — Z1239 Encounter for other screening for malignant neoplasm of breast: Secondary | ICD-10-CM

## 2017-04-11 DIAGNOSIS — N6314 Unspecified lump in the right breast, lower inner quadrant: Secondary | ICD-10-CM

## 2017-04-11 DIAGNOSIS — N63 Unspecified lump in unspecified breast: Secondary | ICD-10-CM

## 2017-04-11 DIAGNOSIS — N6321 Unspecified lump in the left breast, upper outer quadrant: Secondary | ICD-10-CM

## 2017-04-11 DIAGNOSIS — N644 Mastodynia: Secondary | ICD-10-CM

## 2017-04-11 NOTE — Patient Instructions (Signed)
Explained breast self awareness with Karen Gibbs. Patient did not need a Pap smear today due to last Pap smear and HPV typing was 11/25/2014. Let her know BCCCP will cover Pap smears and HPV typing  every 5 years unless has a history of abnormal Pap smears. Referred patient to the [facility] for diagnostic mammogram and possible bilateral breast ultrasound. Appointment scheduled for Thursday, April 11, 2017 at 1050. Karen Gibbs verbalized understanding.  Jayveion Stalling, Arvil Chaco, RN 1:16 PM

## 2017-04-11 NOTE — Progress Notes (Signed)
Complaints of bilateral breast lumps since 2007 and bilateral breast pain. Patient states the pain comes and goes. Patient rates the pain between a 4-8 out of 10.  Pap Smear: Pap smear not completed today. Last Pap smear was 11/25/2014 at [facility] and normal with negative HPV. Per patient has no history of an abnormal Pap smear.  Physical exam: Breasts Breasts symmetrical. No skin abnormalities bilateral breasts. No nipple retraction bilateral breasts. No nipple discharge bilateral breasts. No lymphadenopathy. Palpated a pea sized lump within the left breast at 1 o'clock 16 cm from the nipple. Palpated a mobile lump within the right breast at 5 o'clock 4 cm from the nipple. Patient complained of left outer breast and right inner breast tenderness on exam. Referred patient to the [facility] for diagnostic mammogram and possible bilateral breast ultrasound. Appointment scheduled for Thursday, April 11, 2017 at 1050.        Pelvic/Bimanual No Pap smear completed today since last Pap smear and HPV typing was 11/25/2014. Pap smear not indicated per BCCCP guidelines.   Smoking History: Patient has never smoked.  Patient Navigation: Patient education provided. Access to services provided for patient through [facility] program. [place] interpreter provided.  Used [place] interpreter [employer] from [employer].

## 2017-04-15 ENCOUNTER — Telehealth: Payer: Self-pay

## 2017-04-15 NOTE — Telephone Encounter (Signed)
-----   Message from Alfonse Spruce, [place] sent at 04/11/2017  1:46 PM EDT ----- HIV is negative.

## 2017-04-15 NOTE — Telephone Encounter (Signed)
CMA call regarding lab results   Patient did not answer but left a VM stating the reason of the call & to call back  

## 2017-04-17 ENCOUNTER — Telehealth: Payer: Self-pay | Admitting: Family Medicine

## 2017-04-17 NOTE — Telephone Encounter (Signed)
Pt came into office, results were given to patient. Pt understood she will f/up with PCP on next appt

## 2017-04-25 ENCOUNTER — Ambulatory Visit: Payer: Medicaid Other | Admitting: Family Medicine

## 2017-05-01 ENCOUNTER — Encounter: Payer: Self-pay | Admitting: Family Medicine

## 2017-05-01 ENCOUNTER — Ambulatory Visit: Payer: PRIVATE HEALTH INSURANCE | Attending: Family Medicine | Admitting: Family Medicine

## 2017-05-01 VITALS — BP 113/74 | HR 74 | Temp 98.5°F | Resp 18 | Ht 62.0 in | Wt 207.0 lb

## 2017-05-01 DIAGNOSIS — N920 Excessive and frequent menstruation with regular cycle: Secondary | ICD-10-CM | POA: Insufficient documentation

## 2017-05-01 NOTE — Progress Notes (Signed)
   Subjective:  Patient ID: Karen Gibbs, female    DOB: December 17, 1970  Age: 46 y.o. MRN: [id number]  CC: Establish Care   HPI Gustavo Meditz presents for complaints of menstrual symptoms. Symptoms began 2 years ago. Patient describes symptoms of  menorrhagia (severe), menstrual cramping (moderate to severe), and large blood clots. Symptoms occur with periods, which are usually last 4 to 5 days and are quite regular.LPM 04/12/17.Evaluation to date includes none recent. History of imaging in 2016. Treatment to date includes nothing. She declines oral contraceptives at this time    Outpatient Medications Prior to Visit  Medication Sig Dispense Refill  . ibuprofen (ADVIL,MOTRIN) 600 MG tablet Take 1 tablet (600 mg total) by mouth every 8 (eight) hours as needed (Take with food). (Patient not taking: Reported on 05/01/2017) 30 tablet 0   No facility-administered medications prior to visit.     ROS Review of Systems  Constitutional: Negative.   Respiratory: Negative.   Cardiovascular: Negative.   Gastrointestinal: Negative.   Genitourinary: Positive for menstrual problem.   Objective:  BP 113/74 (BP Location: Left Arm, Patient Position: Sitting, Cuff Size: Normal)   Pulse 74   Temp 98.5 F (36.9 C) (Oral)   Resp 18   Ht 5\' 2"  (1.575 m)   Wt 207 lb (93.9 kg)   SpO2 100%   BMI 37.86 kg/m   BP/Weight 05/01/2017 04/01/2017 04/27/36  Systolic BP 048 889 169  Diastolic BP 74 72 77  Wt. (Lbs) 207 207.2 -  BMI 37.86 37.9 -     Physical Exam  Constitutional: She appears well-developed and well-nourished.  HENT:  Head: Normocephalic and atraumatic.  Right Ear: External ear normal.  Left Ear: External ear normal.  Nose: Nose normal.  Mouth/Throat: Oropharynx is clear and moist.  Eyes: Conjunctivae are normal.  Cardiovascular: Normal rate, regular rhythm, normal heart sounds and intact distal pulses.   Pulmonary/Chest: Effort normal and breath sounds normal.  Abdominal: Soft.  Bowel sounds are normal. There is no tenderness.  Skin: Skin is warm and dry.  Nursing note and vitals reviewed.   Assessment & Plan:   Problem List Items Addressed This Visit      Other   Menorrhagia with regular cycle - Primary   Relevant Orders   CBC   US Pelvis Complete   US Transvaginal Non-OB      No orders of the defined types were placed in this encounter.   Follow-up: Return if symptoms worsen or fail to improve.   Alfonse Spruce FNP

## 2017-05-01 NOTE — Progress Notes (Signed)
Patient is here for heavy pain

## 2017-05-02 LAB — CBC
Hematocrit: 30.7 % — ABNORMAL LOW (ref 34.0–46.6)
Hemoglobin: 9.3 g/dL — ABNORMAL LOW (ref 11.1–15.9)
MCH: 20 pg — ABNORMAL LOW (ref 26.6–33.0)
MCHC: 30.3 g/dL — ABNORMAL LOW (ref 31.5–35.7)
MCV: 66 fL — ABNORMAL LOW (ref 79–97)
Platelets: 301 10*3/uL (ref 150–379)
RBC: 4.64 x10E6/uL (ref 3.77–5.28)
RDW: 19 % — ABNORMAL HIGH (ref 12.3–15.4)
WBC: 6.3 10*3/uL (ref 3.4–10.8)

## 2017-05-07 ENCOUNTER — Other Ambulatory Visit: Payer: Self-pay | Admitting: Family Medicine

## 2017-05-07 ENCOUNTER — Telehealth: Payer: Self-pay

## 2017-05-07 DIAGNOSIS — D500 Iron deficiency anemia secondary to blood loss (chronic): Secondary | ICD-10-CM

## 2017-05-07 MED ORDER — FERROUS SULFATE 325 (65 FE) MG PO TBEC: 325.0000 mg | DELAYED_RELEASE_TABLET | Freq: Three times a day (TID) | ORAL | 3 refills | Status: DC

## 2017-05-07 NOTE — Telephone Encounter (Signed)
interpreter  ID & name [phone] [place] call regarding lab results    Patient did not answer but interpreter left a Vm stating the reason of the call & to call back

## 2017-05-07 NOTE — Telephone Encounter (Signed)
-----   Message from Alfonse Spruce, [place] sent at 05/07/2017  7:13 AM EDT ----- Labs that evaluated your blood cells shows anemia. This can be caused by heavy menstrual cycles. You will be prescribed iron supplements. Recommend follow up in 2 months. -Increase your dietary iron intake. Good sources of iron include dark green leafy vegetables, meats, beans, and iron fortified cereals. Follow up with imaging.

## 2017-05-21 ENCOUNTER — Other Ambulatory Visit: Payer: Self-pay | Admitting: Family Medicine

## 2017-05-23 ENCOUNTER — Telehealth: Payer: Self-pay

## 2017-05-23 MED FILL — FERROUS SULFATE 325 MG TAB: 325 (65 FE) | 30 days supply | Qty: 90 | Fill #0

## 2017-05-23 MED FILL — IBUPROFEN 600 MG TABLET: 600 | 10 days supply | Qty: 30 | Fill #0

## 2017-05-23 NOTE — Telephone Encounter (Signed)
Pt came into the office to get lab results and to schedule an appointment. Pt is aware of results and and is schedule for her appointment

## 2017-06-04 ENCOUNTER — Other Ambulatory Visit: Payer: Self-pay | Admitting: Family Medicine

## 2017-06-06 ENCOUNTER — Ambulatory Visit: Payer: Self-pay | Attending: Family Medicine

## 2017-06-13 ENCOUNTER — Ambulatory Visit: Payer: PRIVATE HEALTH INSURANCE | Attending: Family Medicine | Admitting: Family Medicine

## 2017-06-13 ENCOUNTER — Encounter: Payer: Self-pay | Admitting: Family Medicine

## 2017-06-13 VITALS — BP 121/79 | HR 79 | Temp 98.3°F | Resp 18 | Ht 64.0 in | Wt 207.8 lb

## 2017-06-13 DIAGNOSIS — N920 Excessive and frequent menstruation with regular cycle: Secondary | ICD-10-CM | POA: Insufficient documentation

## 2017-06-13 DIAGNOSIS — Z79899 Other long term (current) drug therapy: Secondary | ICD-10-CM | POA: Insufficient documentation

## 2017-06-13 DIAGNOSIS — Z862 Personal history of diseases of the blood and blood-forming organs and certain disorders involving the immune mechanism: Secondary | ICD-10-CM

## 2017-06-13 DIAGNOSIS — Z3009 Encounter for other general counseling and advice on contraception: Secondary | ICD-10-CM

## 2017-06-13 DIAGNOSIS — D509 Iron deficiency anemia, unspecified: Secondary | ICD-10-CM | POA: Insufficient documentation

## 2017-06-13 NOTE — Progress Notes (Signed)
Patient is here for birth control

## 2017-06-13 NOTE — Progress Notes (Signed)
   Subjective:  Patient ID: Karen Gibbs, female    DOB: August 20, 1971  Age: 46 y.o. MRN: [id number]  CC: Menorrhagia and Contraception   HPI [name] presents for complaints of menstrual symptoms. Interpreter services used. Symptoms began 2 years ago. Patient describes symptoms of  menorrhagia (severe), menstrual cramping (moderate to severe), and large blood clots. Symptoms occur with periods, which are usually last 4 to 5 days and are quite regular. Evaluation to date includes none recent. History of imaging in 2016. She was referred for imaging at previous office visit however she did not follow up. Treatment to date includes nothing. [place] Contraceptives discussed at this time. She request IUD. She is not a current smoker, denies any history of blood disorder, denies family history of breast or gynecological cancers, has not been diagnosed with migraines in the past.  Outpatient Medications Prior to Visit  Medication Sig Dispense Refill  . ferrous sulfate 325 (65 FE) MG EC tablet Take 1 tablet (325 mg total) by mouth 3 (three) times daily with meals. 90 tablet 3  . ibuprofen (ADVIL,MOTRIN) 600 MG tablet Take 1 tablet (600 mg total) by mouth every 8 (eight) hours as needed (Take with food). (Patient not taking: Reported on 05/01/2017) 30 tablet 0   No facility-administered medications prior to visit.     ROS Review of Systems  Constitutional: Negative.   Respiratory: Negative.   Cardiovascular: Negative.   Gastrointestinal: Negative.   Genitourinary: Positive for menstrual problem.   Objective:  BP 121/79 (BP Location: Left Arm, Patient Position: Sitting, Cuff Size: Normal)   Pulse 79   Temp 98.3 F (36.8 C) (Oral)   Resp 18   Ht 5\' 4"  (1.626 m)   Wt 207 lb 12.8 oz (94.3 kg)   LMP 06/06/2017   SpO2 95%   BMI 35.67 kg/m   BP/Weight 06/13/2017 05/01/2017 6/57/8469  Systolic BP 629 528 413  Diastolic BP 79 74 72  Wt. (Lbs) 207.8 207 207.2  BMI 35.67 37.86 37.9      Physical Exam  Constitutional: She appears well-developed and well-nourished.  HENT:  Head: Normocephalic and atraumatic.  Right Ear: External ear normal.  Left Ear: External ear normal.  Nose: Nose normal.  Mouth/Throat: Oropharynx is clear and moist.  Eyes: Conjunctivae are normal.  Cardiovascular: Normal rate, regular rhythm, normal heart sounds and intact distal pulses.   Pulmonary/Chest: Effort normal and breath sounds normal.  Abdominal: Soft. Bowel sounds are normal. There is no tenderness.  Skin: Skin is warm and dry.  Nursing note and vitals reviewed.   Assessment & Plan:   1. Menorrhagia with regular cycle Recommended patient follow up with ultrasound imaging as previously ordered. She declines at this time.   - Ambulatory referral to Gynecology - CBC  2. Contraceptive education   3. H/O microcytic hypochromic anemia  - CBC   No orders of the defined types were placed in this encounter.   Follow-up: Return if symptoms worsen or fail to improve.   Alfonse Spruce FNP

## 2017-06-19 ENCOUNTER — Ambulatory Visit: Payer: Medicaid Other | Attending: Family Medicine

## 2017-07-04 ENCOUNTER — Ambulatory Visit (HOSPITAL_COMMUNITY)
Admission: RE | Admit: 2017-07-04 | Discharge: 2017-07-04 | Disposition: A | Discharge status: Home / Self Care | Payer: Medicaid Other | Source: Ambulatory Visit | Attending: Family Medicine | Admitting: Family Medicine

## 2017-07-04 DIAGNOSIS — D251 Intramural leiomyoma of uterus: Secondary | ICD-10-CM | POA: Insufficient documentation

## 2017-07-04 DIAGNOSIS — N920 Excessive and frequent menstruation with regular cycle: Secondary | ICD-10-CM | POA: Insufficient documentation

## 2017-07-11 ENCOUNTER — Other Ambulatory Visit: Payer: Self-pay | Admitting: Family Medicine

## 2017-07-11 DIAGNOSIS — N920 Excessive and frequent menstruation with regular cycle: Secondary | ICD-10-CM

## 2017-07-11 DIAGNOSIS — D251 Intramural leiomyoma of uterus: Secondary | ICD-10-CM

## 2017-07-12 ENCOUNTER — Telehealth: Payer: Self-pay

## 2017-07-12 NOTE — Telephone Encounter (Signed)
interpreter name & id#  [id number] Karen Gibbs   CMA call regarding lab results   Patient verify DOB   Patient was aware and understood

## 2017-07-12 NOTE — Telephone Encounter (Signed)
-----   Message from Alfonse Spruce, [place] sent at 07/11/2017  9:40 AM EST ----- Imaging showed small uterine fibroid 1.2 cm.Ovaries normal. You will be referred to gynecology.

## 2017-07-19 ENCOUNTER — Encounter: Payer: Self-pay | Admitting: Obstetrics & Gynecology

## 2017-08-19 ENCOUNTER — Encounter: Payer: Self-pay | Admitting: Obstetrics & Gynecology

## 2017-08-19 ENCOUNTER — Ambulatory Visit (INDEPENDENT_AMBULATORY_CARE_PROVIDER_SITE_OTHER): Payer: PRIVATE HEALTH INSURANCE | Admitting: Obstetrics & Gynecology

## 2017-08-19 VITALS — BP 121/77 | HR 73 | Wt 214.0 lb

## 2017-08-19 DIAGNOSIS — N920 Excessive and frequent menstruation with regular cycle: Secondary | ICD-10-CM

## 2017-08-19 DIAGNOSIS — Z309 Encounter for contraceptive management, unspecified: Secondary | ICD-10-CM

## 2017-08-19 DIAGNOSIS — F329 Major depressive disorder, single episode, unspecified: Secondary | ICD-10-CM

## 2017-08-19 DIAGNOSIS — F32A Depression, unspecified: Secondary | ICD-10-CM

## 2017-08-19 MED ORDER — NORGESTREL-ETHINYL ESTRADIOL 0.3-30 MG-MCG PO TABS: 1.0000 | ORAL_TABLET | Freq: Every day | ORAL | 11 refills | Status: DC

## 2017-08-19 NOTE — Progress Notes (Signed)
Patient ID: Karen Gibbs, female   DOB: 1970/12/11, 46 y.o.   MRN: [id number]  Chief Complaint  Patient presents with  . New Patient (Initial Visit)    Leiomyoma of uterus     HPI Karen Gibbs is a 46 y.o. female.   HPI  Past Medical History:  Diagnosis Date  . Environmental allergies     Past Surgical History:  Procedure Laterality Date  . CESAREAN SECTION      No family history on file.  Social History Social History   Tobacco Use  . Smoking status: Never Smoker  . Smokeless tobacco: Never Used  Substance Use Topics  . Alcohol use: No  . Drug use: No    No Known Allergies  Current Outpatient Medications  Medication Sig Dispense Refill  . ferrous sulfate 325 (65 FE) MG EC tablet Take 1 tablet (325 mg total) by mouth 3 (three) times daily with meals. 90 tablet 3  . ibuprofen (ADVIL,MOTRIN) 600 MG tablet Take 1 tablet (600 mg total) by mouth every 8 (eight) hours as needed (Take with food). 30 tablet 0   No current facility-administered medications for this visit.     Review of Systems Review of Systems  Blood pressure 121/77, pulse 73, weight 214 lb (97.1 kg), last menstrual period 07/27/2017.  Physical Exam Physical Exam  Data Reviewed   Assessment        Plan           Emily Filbert 08/19/2017, 9:25 AM

## 2017-08-19 NOTE — Progress Notes (Signed)
Patient ID: Karen Gibbs, female   DOB: [birth year], 46 y.o.   MRN: [id number]  Chief Complaint  Patient presents with  . New Patient (Initial Visit)    Leiomyoma of uterus     HPI Karen Gibbs is a 46 y.o. female. [name] P4 here today with the issue of heavy periods, regular cycles, lasting 6-8 days. Her u/s showed a very small fibroid 1.2 cm with a normal endometrium. She says that she does not want a pregnancy but has not used contraception since 2006.  HPI  Past Medical History:  Diagnosis Date  . Environmental allergies     Past Surgical History:  Procedure Laterality Date  . CESAREAN SECTION      No family history on file.  Social History Social History   Tobacco Use  . Smoking status: Never Smoker  . Smokeless tobacco: Never Used  Substance Use Topics  . Alcohol use: No  . Drug use: No    No Known Allergies  Current Outpatient Medications  Medication Sig Dispense Refill  . ferrous sulfate 325 (65 FE) MG EC tablet Take 1 tablet (325 mg total) by mouth 3 (three) times daily with meals. 90 tablet 3  . ibuprofen (ADVIL,MOTRIN) 600 MG tablet Take 1 tablet (600 mg total) by mouth every 8 (eight) hours as needed (Take with food). 30 tablet 0   No current facility-administered medications for this visit.     Review of Systems Review of Systems  Blood pressure 121/77, pulse 73, weight 214 lb (97.1 kg), last menstrual period 07/27/2017.  Physical Exam Physical Exam  Live interpretor present for exam Breathing, conversing, and ambulating normally Well nourished, well hydrated Black female, no apparent distress Abd- benign   Data Reviewed IMPRESSION: Small left intramural fibroid, 1.2 cm.  Swirling products within the endometrium, presumably blood products. No endometrial thickening or focal endometrial abnormality.  RECOMMENDATION: Bilateral screening mammogram in 1 year.  I have discussed the findings and recommendations with the  patient. Results were also provided in writing at the conclusion of the visit. If applicable, a reminder letter will be sent to the patient regarding the next appointment.  BI-RADS CATEGORY  2: Benign.   Electronically Signed   By: Claudie Revering M.D.   On: 04/11/2017 14:35   Assessment    Menorrhagia with anemia and a 1.2 cm fibroid    Plan    Start OCPs with NMP Check TSH Return 2 months       Charle Clear C Jaquasha Carnevale 08/19/2017, 9:26 AM

## 2017-08-20 LAB — TSH: TSH: 1.5 u[IU]/mL (ref 0.450–4.500)

## 2017-09-24 ENCOUNTER — Encounter: Payer: Self-pay | Admitting: Family Medicine

## 2017-09-24 ENCOUNTER — Ambulatory Visit: Payer: PRIVATE HEALTH INSURANCE | Attending: Family Medicine | Admitting: Family Medicine

## 2017-09-24 VITALS — BP 127/84 | HR 74 | Temp 98.3°F | Resp 18 | Ht 62.0 in | Wt 218.0 lb

## 2017-09-24 DIAGNOSIS — Z862 Personal history of diseases of the blood and blood-forming organs and certain disorders involving the immune mechanism: Secondary | ICD-10-CM

## 2017-09-24 DIAGNOSIS — Z3201 Encounter for pregnancy test, result positive: Secondary | ICD-10-CM | POA: Insufficient documentation

## 2017-09-24 DIAGNOSIS — N912 Amenorrhea, unspecified: Secondary | ICD-10-CM | POA: Diagnosis not present

## 2017-09-24 DIAGNOSIS — D649 Anemia, unspecified: Secondary | ICD-10-CM | POA: Insufficient documentation

## 2017-09-24 DIAGNOSIS — Z640 Problems related to unwanted pregnancy: Secondary | ICD-10-CM | POA: Diagnosis not present

## 2017-09-24 DIAGNOSIS — F4321 Adjustment disorder with depressed mood: Secondary | ICD-10-CM | POA: Diagnosis not present

## 2017-09-24 LAB — POCT URINE PREGNANCY: Preg Test, Ur: POSITIVE — AB

## 2017-09-24 MED ORDER — PRENATAL/FOLIC ACID PO TABS: 1.0000 | ORAL_TABLET | Freq: Every day | ORAL | 11 refills | Status: DC

## 2017-09-24 NOTE — Patient Instructions (Signed)
Follow up with ob/gyn referral.

## 2017-09-24 NOTE — Progress Notes (Signed)
   Subjective:  Patient ID: Karen Gibbs, female    DOB: 1970-09-09  Age: 47 y.o. MRN: [id number]  CC: Follow-up   HPI Karen Gibbs presents for follow up. Interpreter services used.  Amenorrhea  LPM Nov 24 th. Urine pregnancy in office positive. She denies any abdominal pain or discharge. She reports pregnancy was unwanted. Patient has 4 children ages 74, 23, 92, 2. She was reports not starting OCP because next menstrual cycle never occurred. She reports depressed mood. She denies any SI/HI. She declines speaking with LCSW at this time. She does report relationship between her and her husband has been strained. She denies being in any immediate danger.   History of anemia Asymptomatic. Related to history of menorrhagia. Previous workup included pelvic ultrasound that showed 1.2 cm fibroid with normal endometrium.    Outpatient Medications Prior to Visit  Medication Sig Dispense Refill  . ferrous sulfate 325 (65 FE) MG EC tablet Take 1 tablet (325 mg total) by mouth 3 (three) times daily with meals. (Patient not taking: Reported on 09/24/2017) 90 tablet 3  . ibuprofen (ADVIL,MOTRIN) 600 MG tablet Take 1 tablet (600 mg total) by mouth every 8 (eight) hours as needed (Take with food). 30 tablet 0  . norgestrel-ethinyl estradiol (LO/OVRAL,CRYSELLE) 0.3-30 MG-MCG tablet Take 1 tablet by mouth daily. 1 Package 11   No facility-administered medications prior to visit.     ROS Review of Systems  Constitutional: Negative.   Respiratory: Negative.   Cardiovascular: Negative.   Gastrointestinal: Negative.   Skin: Negative.    Objective:  BP 127/84 (BP Location: Left Arm, Patient Position: Sitting, Cuff Size: Large)   Pulse 74   Temp 98.3 F (36.8 C) (Oral)   Resp 18   Ht 5\' 2"  (1.575 m)   Wt 218 lb (98.9 kg)   LMP 07/27/2017   SpO2 99%   BMI 39.87 kg/m   BP/Weight 09/24/2017 08/19/2017 36/64/4034  Systolic BP 742 595 638  Diastolic BP 84 77 79  Wt. (Lbs) 218 214 207.8    BMI 39.87 36.73 35.67     Physical Exam  Constitutional: She appears well-developed and well-nourished.  Cardiovascular: Normal rate, regular rhythm, normal heart sounds and intact distal pulses.  Pulmonary/Chest: Effort normal and breath sounds normal.  Abdominal: Soft. Bowel sounds are normal. There is no tenderness.  Skin: Skin is warm and dry.  Psychiatric: She exhibits a depressed mood. She expresses no homicidal and no suicidal ideation. She expresses no suicidal plans and no homicidal plans. She is communicative. She is attentive.  Nursing note and vitals reviewed.    Assessment & Plan:  1. Positive urine pregnancy test  - hCG, serum, qualitative - Prenatal Vit-Fe Fumarate-FA (PRENATAL/FOLIC ACID) TABS; Take 1 tablet by mouth daily.  Dispense: 30 each; Refill: 11  2. Amenorrhea  - POCT urine pregnancy  3. Unwanted pregnancy  - Ambulatory referral to Obstetrics / Gynecology  4. Adjustment disorder with depressed mood -Given LCSW contact information. -Discussed contacting authorities if she feels in any immediate danger.  5. History of anemia F/u lab for anemia - CBC      Follow-up: Return in about 6 weeks (around 11/05/2017) for Anemia / Follow up.   Alfonse Spruce FNP

## 2017-09-25 ENCOUNTER — Other Ambulatory Visit: Payer: Self-pay | Admitting: Family Medicine

## 2017-09-25 DIAGNOSIS — D500 Iron deficiency anemia secondary to blood loss (chronic): Secondary | ICD-10-CM

## 2017-09-25 LAB — CBC
Hematocrit: 36.7 % (ref 34.0–46.6)
Hemoglobin: 12 g/dL (ref 11.1–15.9)
MCH: 23.5 pg — ABNORMAL LOW (ref 26.6–33.0)
MCHC: 32.7 g/dL (ref 31.5–35.7)
MCV: 72 fL — ABNORMAL LOW (ref 79–97)
Platelets: 333 10*3/uL (ref 150–379)
RBC: 5.11 x10E6/uL (ref 3.77–5.28)
RDW: 16.2 % — ABNORMAL HIGH (ref 12.3–15.4)
WBC: 8.1 10*3/uL (ref 3.4–10.8)

## 2017-09-25 LAB — HCG, SERUM, QUALITATIVE: hCG,Beta Subunit,Qual,Serum: POSITIVE m[IU]/mL — AB (ref <6)

## 2017-09-25 MED ORDER — FERROUS SULFATE 325 (65 FE) MG PO TBEC: 325.0000 mg | DELAYED_RELEASE_TABLET | Freq: Three times a day (TID) | ORAL | 0 refills | Status: DC

## 2017-09-25 MED FILL — FERROUS SULFATE 325 MG TAB: 325 (65 FE) | 30 days supply | Qty: 90 | Fill #0

## 2017-10-02 ENCOUNTER — Telehealth: Payer: Self-pay | Admitting: Family Medicine

## 2017-10-02 ENCOUNTER — Telehealth (INDEPENDENT_AMBULATORY_CARE_PROVIDER_SITE_OTHER): Payer: Self-pay | Admitting: *Deleted

## 2017-10-02 NOTE — Telephone Encounter (Signed)
Pt called to receive her lab results, and understood.

## 2017-10-02 NOTE — Telephone Encounter (Signed)
-----   Message from Alfonse Spruce, [place] sent at 09/25/2017  3:23 PM EST ----- Urine pregnancy is positive. Follow up with ob-gyn referral. Labs shows improving anemia. Continue to take iron supplements and vitamins. Increase your dietary iron intake. Good sources of iron include dark green leafy vegetables, meats, beans, and iron fortified cereals.

## 2017-10-02 NOTE — Telephone Encounter (Signed)
Medical Assistant used [employer] to contact patient.  Interpreter Name: Loralyn Freshwater #: [id number] Patient was not available, [employer] left patient a voicemail. Voicemail states to give a call back to [place] with [facility] at [phone]. !!!Please inform patient of Urine pregnancy is positive. Follow up with ob-gyn referral. Labs shows improving anemia. Continue to take iron supplements and vitamins. Increase your dietary iron intake. Good sources of iron include dark green leafy vegetables, meats, beans, and iron fortified cereals!!

## 2017-10-03 ENCOUNTER — Ambulatory Visit: Payer: PRIVATE HEALTH INSURANCE | Attending: Family Medicine | Admitting: Licensed Clinical Social Worker

## 2017-10-03 DIAGNOSIS — F4321 Adjustment disorder with depressed mood: Secondary | ICD-10-CM

## 2017-10-03 NOTE — BH Specialist Note (Signed)
Integrated Behavioral Health Initial Visit  MRN: [id number] Name: Karen Gibbs  Number of [place] Clinician visits:: 1/6 Session Start time: 11:00 AM  Session End time: 11:45 AM Total time: 45 minutes  Type of Service: [place] Interpretor:Yes.   Interpretor Name and Language: Elgie Congo PT#[id number] French   Warm Hand Off Completed.       SUBJECTIVE: Karen Gibbs is a 47 y.o. female accompanied by self Patient was referred by FNP [place] for depression. Patient reports the following symptoms/concerns: feelings of sadness and worry, difficulty sleeping, low energy, feeling bad about self, and decreased concentration Duration of problem: One month; Severity of problem: severe  OBJECTIVE: Mood: Depressed and Affect: Tearful Risk of harm to self or others: No plan to harm self or others  LIFE CONTEXT: Family and Social: Pt resides with spouse and four children ages 40, 38, 97, and 69. She denies having additional family and friends that reside nearby  School/Work: Pt and spouse are both employed. Pt works 12 hr night shifts. The family receives food stamps Self-Care: Pt enjoys spending time with children and sleeping Life Changes: Pt recently found out she is pregnant.  She reports spouse is upset due to them having four children and concerned about ability to afford another child. Ongoing verbal conflict in home between pt and spouse  GOALS ADDRESSED: Patient will: 1. Reduce symptoms of: anxiety, depression and stress 2. Increase knowledge and/or ability of: coping skills  3. Demonstrate ability to: Increase healthy adjustment to current life circumstances and Increase adequate support systems for patient/family  INTERVENTIONS: Interventions utilized: Mindfulness or Psychologist, educational, Supportive Counseling, Psychoeducation and/or Health Education and Link to [employer]  Standardized Assessments completed:  GAD-7 and PHQ 2&9 with C-SSRS  ASSESSMENT: Patient currently experiencing depression, anxiety, and stress triggered by unplanned pregnancy. She and spouse engage in ongoing verbal conflict due to them having four children and concerned about ability to afford another child. Pt reports feelings of sadness and worry, difficulty sleeping, low energy, feeling bad about self, and decreased concentration. She denied current SI/HI/AVH or intent or plan to harm self or others. No hx of postpartum depression.   Patient may benefit from psychoeducation and psychotherapy. [place] educated pt on correlation between one's physical and mental health, in addition, to how stress can negatively impact health. LCSWA discussed therapeutic interventions that promote relaxation and decrease symptoms. Pt denied feeling unsafe in the home. She identified healthy coping skills and is open to therapy. LCSWA provided information on community resources that can assist with crisis intervention, psychotherapy, utility assistance and food insecurity.   PLAN: 1. Follow up with behavioral health clinician on : Pt was encouraged to contact [place] if symptoms worsen or fail to improve to schedule behavioral appointments at [facility]. LCSWA will contact pt with any additional resources to assist with baby's needs, as requested by pt 2. Behavioral recommendations: LCSWA recommends that pt apply healthy coping skills discussed and utilize provided resources. Pt is encouraged to schedule follow up appointment with LCSWA 3. Referral(s): Armed forces logistics/support/administrative officer (LME/Outside Clinic) and Community Resources:  Presenter, broadcasting 4. "From scale of 1-10, how likely are you to follow plan?": 8/10  Rebekah Chesterfield, LCSW 10/03/17 3:17 PM

## 2017-10-04 ENCOUNTER — Other Ambulatory Visit: Payer: Self-pay | Admitting: Family Medicine

## 2017-10-04 NOTE — Telephone Encounter (Signed)
Interpreter service used [place]  # [phone] left message for call back.

## 2017-10-04 NOTE — Telephone Encounter (Signed)
Interpreter service used [employer]  # [phone] left message for call back.

## 2017-10-04 NOTE — Telephone Encounter (Signed)
Interpreter services used [employer] [phone]. Left message for call back.

## 2017-10-04 NOTE — Telephone Encounter (Signed)
Interpreter services used [employer] [phone]. Left message for call back.

## 2017-10-06 ENCOUNTER — Inpatient Hospital Stay (HOSPITAL_COMMUNITY)
Admission: AD | Admit: 2017-10-06 | Discharge: 2017-10-06 | Disposition: A | Discharge status: Home / Self Care | Payer: PRIVATE HEALTH INSURANCE | Source: Ambulatory Visit | Attending: Obstetrics & Gynecology | Admitting: Obstetrics & Gynecology

## 2017-10-06 ENCOUNTER — Encounter (HOSPITAL_COMMUNITY): Payer: Self-pay

## 2017-10-06 ENCOUNTER — Inpatient Hospital Stay (HOSPITAL_COMMUNITY): Payer: Self-pay

## 2017-10-06 DIAGNOSIS — O200 Threatened abortion: Secondary | ICD-10-CM | POA: Diagnosis not present

## 2017-10-06 DIAGNOSIS — Z3A10 10 weeks gestation of pregnancy: Secondary | ICD-10-CM | POA: Insufficient documentation

## 2017-10-06 DIAGNOSIS — R1030 Lower abdominal pain, unspecified: Secondary | ICD-10-CM | POA: Insufficient documentation

## 2017-10-06 DIAGNOSIS — O208 Other hemorrhage in early pregnancy: Secondary | ICD-10-CM | POA: Insufficient documentation

## 2017-10-06 DIAGNOSIS — O209 Hemorrhage in early pregnancy, unspecified: Secondary | ICD-10-CM | POA: Diagnosis not present

## 2017-10-06 DIAGNOSIS — O039 Complete or unspecified spontaneous abortion without complication: Secondary | ICD-10-CM | POA: Diagnosis not present

## 2017-10-06 LAB — WET PREP, GENITAL
Clue Cells Wet Prep HPF POC: NONE SEEN
Sperm: NONE SEEN
Trich, Wet Prep: NONE SEEN
Yeast Wet Prep HPF POC: NONE SEEN

## 2017-10-06 LAB — CBC
HCT: 37 % (ref 36.0–46.0)
Hemoglobin: 12.1 g/dL (ref 12.0–15.0)
MCH: 24.2 pg — ABNORMAL LOW (ref 26.0–34.0)
MCHC: 32.7 g/dL (ref 30.0–36.0)
MCV: 73.9 fL — ABNORMAL LOW (ref 78.0–100.0)
Platelets: 243 10*3/uL (ref 150–400)
RBC: 5.01 MIL/uL (ref 3.87–5.11)
RDW: 15.7 % — ABNORMAL HIGH (ref 11.5–15.5)
WBC: 7.1 10*3/uL (ref 4.0–10.5)

## 2017-10-06 LAB — ABO/RH: ABO/RH(D): AB POS

## 2017-10-06 LAB — HCG, QUANTITATIVE, PREGNANCY: hCG, Beta Chain, Quant, S: 33589 m[IU]/mL — ABNORMAL HIGH (ref <5)

## 2017-10-06 NOTE — MAU Note (Signed)
Patient presents with onset of a little bit of vaginal bleeding since last night,

## 2017-10-06 NOTE — Progress Notes (Signed)
G5P4 @ 10.[redacted] wksga. Here due to bleeding yesterday and today. States lilttle bleeding on toilet paper when using bathroom

## 2017-10-06 NOTE — Discharge Instructions (Signed)
No sex until you have had an ultrasound.  Nothing in the vagina. Ultrasound will call you to schedule an appointment.  Expect to have an ultrasound on Monday, Feb. 11. You will be seen in the clinic after the ultrasound for results.

## 2017-10-06 NOTE — MAU Provider Note (Signed)
History     CSN: [id number]  Arrival date and time: 10/06/17 1300   First Provider Initiated Contact with Patient 10/06/17 1402      Chief Complaint  Patient presents with  . Vaginal Bleeding  . Abdominal Pain   HPI Karen Gibbs 47 y.o. [redacted]w[redacted]d  Comes to MAU with lower abdominal cramping and vaginal bleeding.  Has had the cramping for several days and began having vaginal bleeding on yesterday.  Recently found out she is pregnancy and has an appointment in the clinic on 10-21-17.  Client speaks some [place] and [place].    OB History    Gravida Para Term Preterm AB Living   5 4 4  0 0 4   SAB TAB Ectopic Multiple Live Births   0 0 0 0        Past Medical History:  Diagnosis Date  . Environmental allergies     Past Surgical History:  Procedure Laterality Date  . CESAREAN SECTION      No family history on file.  Social History   Tobacco Use  . Smoking status: Never Smoker  . Smokeless tobacco: Never Used  Substance Use Topics  . Alcohol use: No  . Drug use: No    Allergies: No Known Allergies  Medications Prior to Admission  Medication Sig Dispense Refill Last Dose  . ferrous sulfate 325 (65 FE) MG EC tablet Take 1 tablet (325 mg total) by mouth 3 (three) times daily with meals. 90 tablet 0   . Prenatal Vit-Fe Fumarate-FA (PRENATAL/FOLIC ACID) TABS Take 1 tablet by mouth daily. 30 each 11     Review of Systems  Constitutional: Negative for fever.  Gastrointestinal: Positive for abdominal pain. Negative for nausea and vomiting.  Genitourinary: Positive for vaginal bleeding. Negative for dysuria and vaginal discharge.   Physical Exam   Blood pressure 123/71, pulse 76, temperature 98.7 F (37.1 C), temperature source Oral, resp. rate 18, height 5\' 2"  (1.575 m), weight 217 lb (98.4 kg), last menstrual period 07/27/2017.  Physical Exam  Nursing note and vitals reviewed. Constitutional: She is oriented to person, place, and time. She appears well-developed and  well-nourished.  HENT:  Head: Normocephalic.  Eyes: EOM are normal.  Neck: Neck supple.  GI: Soft. There is no tenderness. There is no rebound and no guarding.  Genitourinary:  Genitourinary Comments: Speculum exam: Vagina - Small amount of bloody discharge, no odor Cervix - Small amount of active bleeding Bimanual exam: Cervix closed Uterus non tender, unable to size due to habitus Adnexa non tender, no masses bilaterally GC/Chlam, wet prep done Chaperone present for exam.   Musculoskeletal: Normal range of motion.  Neurological: She is alert and oriented to person, place, and time.  Skin: Skin is warm and dry.  Psychiatric: She has a normal mood and affect.    MAU Course  Procedures Results for orders placed or performed during the hospital encounter of 10/06/17 (from the past 24 hour(s))  CBC     Status: Abnormal   Collection Time: 10/06/17  2:09 PM  Result Value Ref Range   WBC 7.1 4.0 - 10.5 K/uL   RBC 5.01 3.87 - 5.11 MIL/uL   Hemoglobin 12.1 12.0 - 15.0 g/dL   HCT 37.0 36.0 - 46.0 %   MCV 73.9 (L) 78.0 - 100.0 fL   MCH 24.2 (L) 26.0 - 34.0 pg   MCHC 32.7 30.0 - 36.0 g/dL   RDW 15.7 (H) 11.5 - 15.5 %   Platelets 243 150 -  400 K/uL  hCG, quantitative, pregnancy     Status: Abnormal   Collection Time: 10/06/17  2:09 PM  Result Value Ref Range   hCG, Beta Chain, Quant, S 33,589 (H) <5 mIU/mL  ABO/Rh     Status: None (Preliminary result)   Collection Time: 10/06/17  2:09 PM  Result Value Ref Range   ABO/RH(D)      AB POS Performed at [facility], [address], [place], [place] [address]   Wet prep, genital     Status: Abnormal   Collection Time: 10/06/17  2:57 PM  Result Value Ref Range   Yeast Wet Prep HPF POC NONE SEEN NONE SEEN   Trich, Wet Prep NONE SEEN NONE SEEN   Clue Cells Wet Prep HPF POC NONE SEEN NONE SEEN   WBC, Wet Prep HPF POC MANY (A) NONE SEEN   Sperm NONE SEEN     MDM CLINICAL DATA:  Bleeding.  Mild pain.  EXAM: OBSTETRIC <14 WK  [place] AND TRANSVAGINAL OB US  TECHNIQUE: Both transabdominal and transvaginal ultrasound examinations were performed for complete evaluation of the gestation as well as the maternal uterus, adnexal regions, and pelvic cul-de-sac. Transvaginal technique was performed to assess early pregnancy.  COMPARISON:  None.  FINDINGS: Intrauterine gestational sac: Single  Yolk sac:  Visualized.  The yolk sac measures nearly 11 mm.  Embryo:  Questionable  MSD:   mm    w     d  CRL:  1.7 mm which is too small to calculate a gestational age  Subchorionic hemorrhage:  There is a small subchorionic hemorrhage.  Maternal uterus/adnexae: The ovaries are normal in appearance. Multiple fibroids are seen in the uterus with the largest measuring 2 cm.  IMPRESSION: 1. An intrauterine pregnancy is identified. The yolk sac measures nearly 11 mm which is suspicious for a nonviable pregnancy, especially given the tiny fetal pole given the reported last menstrual period and beta HCG level. A follow-up ultrasound in 7-10 days could be utilized for further evaluation of viability. 2. Small subchorionic hemorrhage. 3. Multiple fibroids in the uterus.   Ultrasound reviewed.  With a [place] interpreter on the video line, discussed the following results and the discharge plan with the client and her partner:  IUGS and yolk sac which confirms an IUP rather than an ectopic pregnancy.  Fetal pole not seen.  Yolk sac is enlarged and suspicious for but not determinant for nonviable pregnancy. Blood type AB positive Reviewed plan of care with Dr. Roselie Awkward - will have an ultrasound scheduled on outpatient basis   Assessment and Plan  Vaginal bleeding in pregnancy, first trimester - possibly due to subchorionic hemorrhage or threatened miscarriage IUP without fetal pole  Plan Outpatient [place]  - order sent for outpatient [place] to be scheduled and will be seen in the clinic after the ultrasound to discuss  results. Advised client to request a [place] interpreter when ultrasound calls her if she does not understand the conversation. Take Tylenol 325 mg 2 tablets by mouth every 4 hours if needed for pain. Drink at least 8 8-oz glasses of water every day. Return if you have severe vaginal bleeding or severe abdominal pain.  Terri L Burleson 10/06/2017, 2:08 PM

## 2017-10-07 ENCOUNTER — Telehealth: Payer: Self-pay | Admitting: Family Medicine

## 2017-10-07 ENCOUNTER — Inpatient Hospital Stay (HOSPITAL_COMMUNITY): Payer: Self-pay

## 2017-10-07 ENCOUNTER — Encounter (HOSPITAL_COMMUNITY): Payer: Self-pay | Admitting: *Deleted

## 2017-10-07 ENCOUNTER — Encounter: Payer: Self-pay | Admitting: Family Medicine

## 2017-10-07 ENCOUNTER — Other Ambulatory Visit: Payer: Self-pay | Admitting: Family Medicine

## 2017-10-07 ENCOUNTER — Inpatient Hospital Stay (HOSPITAL_COMMUNITY)
Admission: AD | Admit: 2017-10-07 | Discharge: 2017-10-07 | Disposition: A | Discharge status: Home / Self Care | Payer: PRIVATE HEALTH INSURANCE | Source: Ambulatory Visit | Attending: Obstetrics & Gynecology | Admitting: Obstetrics & Gynecology

## 2017-10-07 DIAGNOSIS — R109 Unspecified abdominal pain: Secondary | ICD-10-CM | POA: Insufficient documentation

## 2017-10-07 DIAGNOSIS — Z3A10 10 weeks gestation of pregnancy: Secondary | ICD-10-CM | POA: Insufficient documentation

## 2017-10-07 DIAGNOSIS — O208 Other hemorrhage in early pregnancy: Secondary | ICD-10-CM

## 2017-10-07 DIAGNOSIS — O209 Hemorrhage in early pregnancy, unspecified: Secondary | ICD-10-CM

## 2017-10-07 DIAGNOSIS — O26891 Other specified pregnancy related conditions, first trimester: Secondary | ICD-10-CM

## 2017-10-07 DIAGNOSIS — O200 Threatened abortion: Secondary | ICD-10-CM | POA: Insufficient documentation

## 2017-10-07 LAB — URINALYSIS, ROUTINE W REFLEX MICROSCOPIC
Bilirubin Urine: NEGATIVE
Glucose, UA: NEGATIVE mg/dL
Ketones, ur: NEGATIVE mg/dL
Nitrite: NEGATIVE
Protein, ur: NEGATIVE mg/dL
Specific Gravity, Urine: 1.017 (ref 1.005–1.030)
pH: 5 (ref 5.0–8.0)

## 2017-10-07 LAB — HIV ANTIBODY (ROUTINE TESTING W REFLEX): HIV Screen 4th Generation wRfx: NONREACTIVE

## 2017-10-07 LAB — RPR: RPR Ser Ql: NONREACTIVE

## 2017-10-07 LAB — GC/CHLAMYDIA PROBE AMP (~~LOC~~) NOT AT ARMC
Chlamydia: NEGATIVE
Neisseria Gonorrhea: NEGATIVE

## 2017-10-07 MED ORDER — ACETAMINOPHEN 325 MG PO TABS
650.0000 mg | ORAL_TABLET | Freq: Once | ORAL | Status: AC
  Administered 2017-10-07: 650 mg via ORAL
  Filled 2017-10-07: qty 2

## 2017-10-07 NOTE — MAU Note (Signed)
Patient c/o  +vaginal bleeding Started Saturday Started brown and now red Has increased; wearing a pad  +Lower back pain Cramping in nature Intermittent Rating pain 6/10 Has not taken anything for the pain  Was seen yesterday.

## 2017-10-07 NOTE — Telephone Encounter (Addendum)
Patient called Interpreter services used.  Karen Gibbs  #[id number]. Called and spoke with patient. She reported symptoms of  vaginal bleeding over the weekend and being evaluated in hospital for symptoms. She reports bleeding has persisted. US  showed possible non viable pregnancy. She report initially pregnancy was unwanted but now reports that she is okay with pregnancy.  Recommended she go [facility] ASAP for further follow up. Was encouraged to contact [facility] LCSW if she need counseling resources. She communicates understanding and is agreeable to plan.

## 2017-10-07 NOTE — Discharge Instructions (Signed)
Abdominal Pain During Pregnancy Abdominal pain is common in pregnancy. Most of the time, it does not cause harm. There are many causes of abdominal pain. Some causes are more serious than others and sometimes the cause is not known. Abdominal pain can be a sign that something is very wrong with the pregnancy or the pain may have nothing to do with the pregnancy. Always tell your health care provider if you have any abdominal pain. Follow these instructions at home:  Do not have sex or put anything in your vagina until your symptoms go away completely.  Watch your abdominal pain for any changes.  Get plenty of rest until your pain improves.  Drink enough fluid to keep your urine clear or pale yellow.  Take over-the-counter or prescription medicines only as told by your health care provider.  Keep all follow-up visits as told by your health care provider. This is important. Contact a health care provider if:  You have a fever.  Your pain gets worse or you have cramping.  Your pain continues after resting. Get help right away if:  You are bleeding, leaking fluid, or passing tissue from the vagina.  You have vomiting or diarrhea that does not go away.  You have painful or bloody urination.  You notice a decrease in your baby's movements.  You feel very weak or faint.  You have shortness of breath.  You develop a severe headache with abdominal pain.  You have abnormal vaginal discharge with abdominal pain. This information is not intended to replace advice given to you by your health care provider. Make sure you discuss any questions you have with your health care provider. Document Released: 08/20/2005 Document Revised: 05/31/2016 Document Reviewed: 03/19/2013 Elsevier Interactive Patient Education  2018 [employer].  Vaginal Bleeding During Pregnancy, First Trimester A small amount of bleeding (spotting) from the vagina is relatively common in early pregnancy. It usually  stops on its own. Various things may cause bleeding or spotting in early pregnancy. Some bleeding may be related to the pregnancy, and some may not. In most cases, the bleeding is normal and is not a problem. However, bleeding can also be a sign of something serious. Be sure to tell your health care provider about any vaginal bleeding right away. Some possible causes of vaginal bleeding during the first trimester include:  Infection or inflammation of the cervix.  Growths (polyps) on the cervix.  Miscarriage or threatened miscarriage.  Pregnancy tissue has developed outside of the uterus and in a fallopian tube (tubal pregnancy).  Tiny cysts have developed in the uterus instead of pregnancy tissue (molar pregnancy).  Follow these instructions at home: Watch your condition for any changes. The following actions may help to lessen any discomfort you are feeling:  Follow your health care provider's instructions for limiting your activity. If your health care provider orders bed rest, you may need to stay in bed and only get up to use the bathroom. However, your health care provider may allow you to continue light activity.  If needed, make plans for someone to help with your regular activities and responsibilities while you are on bed rest.  Keep track of the number of pads you use each day, how often you change pads, and how soaked (saturated) they are. Write this down.  Do not use tampons. Do not douche.  Do not have sexual intercourse or orgasms until approved by your health care provider.  If you pass any tissue from your vagina, save  the tissue so you can show it to your health care provider.  Only take over-the-counter or prescription medicines as directed by your health care provider.  Do not take aspirin because it can make you bleed.  Keep all follow-up appointments as directed by your health care provider.  Contact a health care provider if:  You have any vaginal bleeding  during any part of your pregnancy.  You have cramps or labor pains.  You have a fever, not controlled by medicine. Get help right away if:  You have severe cramps in your back or belly (abdomen).  You pass large clots or tissue from your vagina.  Your bleeding increases.  You feel light-headed or weak, or you have fainting episodes.  You have chills.  You are leaking fluid or have a gush of fluid from your vagina.  You pass out while having a bowel movement. This information is not intended to replace advice given to you by your health care provider. Make sure you discuss any questions you have with your health care provider. Document Released: 05/30/2005 Document Revised: 01/26/2016 Document Reviewed: 04/27/2013 Elsevier Interactive Patient Education  [employer].
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
## 2017-10-08 ENCOUNTER — Encounter (HOSPITAL_COMMUNITY): Payer: Self-pay | Admitting: *Deleted

## 2017-10-08 ENCOUNTER — Inpatient Hospital Stay (HOSPITAL_COMMUNITY)
Admission: AD | Admit: 2017-10-08 | Discharge: 2017-10-09 | Disposition: A | Discharge status: Home / Self Care | Payer: PRIVATE HEALTH INSURANCE | Source: Ambulatory Visit | Attending: Obstetrics and Gynecology | Admitting: Obstetrics and Gynecology

## 2017-10-08 DIAGNOSIS — O209 Hemorrhage in early pregnancy, unspecified: Secondary | ICD-10-CM | POA: Insufficient documentation

## 2017-10-08 DIAGNOSIS — O039 Complete or unspecified spontaneous abortion without complication: Secondary | ICD-10-CM | POA: Insufficient documentation

## 2017-10-08 LAB — CBC
HCT: 36.9 % (ref 36.0–46.0)
Hemoglobin: 12.1 g/dL (ref 12.0–15.0)
MCH: 24.2 pg — ABNORMAL LOW (ref 26.0–34.0)
MCHC: 32.8 g/dL (ref 30.0–36.0)
MCV: 73.9 fL — ABNORMAL LOW (ref 78.0–100.0)
Platelets: 231 10*3/uL (ref 150–400)
RBC: 4.99 MIL/uL (ref 3.87–5.11)
RDW: 16 % — ABNORMAL HIGH (ref 11.5–15.5)
WBC: 7.8 10*3/uL (ref 4.0–10.5)

## 2017-10-08 LAB — HCG, QUANTITATIVE, PREGNANCY: hCG, Beta Chain, Quant, S: 23326 m[IU]/mL — ABNORMAL HIGH (ref <5)

## 2017-10-08 MED ORDER — OXYCODONE-ACETAMINOPHEN 5-325 MG PO TABS
1.0000 | ORAL_TABLET | Freq: Once | ORAL | Status: AC
  Administered 2017-10-08: 1 via ORAL
  Filled 2017-10-08: qty 1

## 2017-10-08 MED ORDER — FENTANYL CITRATE (PF) 100 MCG/2ML IJ SOLN
50.0000 ug | Freq: Once | INTRAMUSCULAR | Status: AC
  Administered 2017-10-08: 50 ug via INTRAVENOUS
  Filled 2017-10-08: qty 2

## 2017-10-08 MED ORDER — KETOROLAC TROMETHAMINE 60 MG/2ML IM SOLN
60.0000 mg | Freq: Once | INTRAMUSCULAR | Status: AC
  Administered 2017-10-08: 60 mg via INTRAMUSCULAR
  Filled 2017-10-08: qty 2

## 2017-10-08 MED ORDER — LACTATED RINGERS IV BOLUS (SEPSIS)
1000.0000 mL | Freq: Once | INTRAVENOUS | Status: AC
  Administered 2017-10-08: 1000 mL via INTRAVENOUS

## 2017-10-08 MED ORDER — OXYCODONE-ACETAMINOPHEN 5-325 MG PO TABS: 2.0000 | ORAL_TABLET | Freq: Four times a day (QID) | ORAL | 0 refills | Status: DC | PRN

## 2017-10-08 MED ORDER — MISOPROSTOL 200 MCG PO TABS
800.0000 ug | ORAL_TABLET | Freq: Once | ORAL | Status: AC
  Administered 2017-10-08: 800 ug via ORAL
  Filled 2017-10-08: qty 4

## 2017-10-08 NOTE — Discharge Instructions (Signed)

## 2017-10-08 NOTE — MAU Provider Note (Signed)
History     CSN: [id number]  Arrival date and time: 10/08/17 1831   First Provider Initiated Contact with Patient 10/08/17 1942      Chief Complaint  Patient presents with  . Vaginal Bleeding   HPI   [place] speaking patient: Interpretor used  [name] is a 47 y.o. female [id number] at [redacted]w[redacted]d here in MAU with complaints of vaginal bleeding. This is the 3rd visit to the MAU for bleeding. States she was told she is likely having a miscarriage.  States she has been bleeding for a few days however today around 1600 the vaginal bleeding became heavy like a period. The pain is significant and she is asking for something for the pain.   OB History    Gravida Para Term Preterm AB Living   5 4 4  0 0 4   SAB TAB Ectopic Multiple Live Births   0 0 0 0        Past Medical History:  Diagnosis Date  . Environmental allergies   . Medical history non-contributory     Past Surgical History:  Procedure Laterality Date  . CESAREAN SECTION     C/S x 2    History reviewed. No pertinent family history.  Social History   Tobacco Use  . Smoking status: Never Smoker  . Smokeless tobacco: Never Used  Substance Use Topics  . Alcohol use: No  . Drug use: No    Allergies: No Known Allergies  Medications Prior to Admission  Medication Sig Dispense Refill Last Dose  . ferrous sulfate 325 (65 FE) MG EC tablet Take 1 tablet (325 mg total) by mouth 3 (three) times daily with meals. 90 tablet 0 10/08/2017 at Unknown time  . Prenatal Vit-Fe Fumarate-FA (PRENATAL/FOLIC ACID) TABS Take 1 tablet by mouth daily. 30 each 11 10/08/2017 at Unknown time   Results for orders placed or performed during the hospital encounter of 10/08/17 (from the past 48 hour(s))  hCG, quantitative, pregnancy     Status: Abnormal   Collection Time: 10/08/17  7:46 PM  Result Value Ref Range   hCG, Beta Chain, Quant, S 23,326 (H) <5 mIU/mL    Comment:          GEST. AGE      CONC.  (mIU/mL)   <=1 WEEK        5 - 50      2 WEEKS       50 - 500     3 WEEKS       100 - 10,000     4 WEEKS     1,000 - 30,000     5 WEEKS     3,500 - 115,000   6-8 WEEKS     12,000 - 270,000    12 WEEKS     15,000 - 220,000        FEMALE AND NON-PREGNANT FEMALE:     LESS THAN 5 mIU/mL Performed at [facility], [address], [place], [place] [address]   CBC     Status: Abnormal   Collection Time: 10/08/17  7:46 PM  Result Value Ref Range   WBC 7.8 4.0 - 10.5 K/uL   RBC 4.99 3.87 - 5.11 MIL/uL   Hemoglobin 12.1 12.0 - 15.0 g/dL   HCT 36.9 36.0 - 46.0 %   MCV 73.9 (L) 78.0 - 100.0 fL   MCH 24.2 (L) 26.0 - 34.0 pg   MCHC 32.8 30.0 - 36.0 g/dL   RDW 16.0 (  H) 11.5 - 15.5 %   Platelets 231 150 - 400 K/uL    Comment: Performed at [facility], [address], [place], [place] [address]   Review of Systems  Constitutional: Negative for fever.  Gastrointestinal: Positive for abdominal pain.  Genitourinary: Positive for vaginal bleeding.  Neurological: Negative for dizziness.   Physical Exam   Blood pressure 140/75, pulse 78, temperature 98.7 F (37.1 C), resp. rate 18, weight 218 lb (98.9 kg), last menstrual period 07/27/2017.  Physical Exam  Constitutional: She is oriented to person, place, and time. She appears well-developed and well-nourished. No distress.  Respiratory: Effort normal.  GI: Soft. She exhibits no distension. There is no tenderness. There is no rebound.  Genitourinary:  Genitourinary Comments: Cervix: slightly open, anterior, large amount of dark red blood noted. Golf ball size clots noted.   Musculoskeletal: Normal range of motion.  Neurological: She is alert and oriented to person, place, and time.  Skin: Skin is warm. She is not diaphoretic. No pallor.  Psychiatric: Her behavior is normal.    MAU Course  Procedures  None  MDM  Quant down from 33,000 to 23,000 Toradol given 60 mg IM, pain down to 0/10 RN called NP to the room, concerned about vaginal bleeding. Vitals stable.   Yellow- large pad full after 1 hour.  LR bolus given- IV established Cytotec 800 mg given oral/buccal: discussed with Dr. Nehemiah Settle. Hgb 12.1 Report given to Darrol Poke CNM who resumes care of the patient.   Noni Saupe I, NP 10/08/2017 9:47 PM  Fentanyl 93mcg IV given for pain control, pain decreased to 0/10 after medication.  Bleeding is minimal with no clots with reassessment 2 hours after Cytotec.  Fundus is firm with minimal bleeding on pad that has been in place for 2 hours.  C/w Dr Nehemiah Settle with current bleeding pattern, assessment and plan. Okay to discharge home with follow up in one week for repeat lab work and office visit with provider in 2 weeks.  Pt vitals stable. Pain medication prescription given to patient for management during miscarriage.   Assessment and Plan   1. Miscarriage   \ Discharge home. Pt stable at time of discharge. Follow up in office in 1 week for repeat HCG  Follow up in 2 weeks for visit with provider  Return to MAU as needed for increase abdominal pain not relieved by medication or increased vaginal bleeding with large clots, dizziness or lightheaded Rx for Percocet given to patient   [place] for [place]. Go on 10/15/2017.   Specialty:  Obstetrics and Gynecology Why:  Follow up in one week for repeat lab work  Contact information: [place] [phone]         Allergies as of 10/08/2017   No Known Allergies     Medication List    TAKE these medications   ferrous sulfate 325 (65 FE) MG EC tablet Take 1 tablet (325 mg total) by mouth 3 (three) times daily with meals.   oxyCODONE-acetaminophen 5-325 MG tablet Commonly known as:  PERCOCET/ROXICET Take 2 tablets by mouth every 6 (six) hours as needed for severe pain.   PRENATAL/FOLIC ACID Tabs Take 1 tablet by mouth daily.      Lajean Manes, CNM 10/08/17, 11:51 PM

## 2017-10-15 ENCOUNTER — Other Ambulatory Visit: Payer: Self-pay

## 2017-10-15 ENCOUNTER — Other Ambulatory Visit: Payer: Self-pay | Admitting: *Deleted

## 2017-10-15 ENCOUNTER — Inpatient Hospital Stay (HOSPITAL_COMMUNITY)
Admission: AD | Admit: 2017-10-15 | Discharge: 2017-10-15 | Disposition: A | Discharge status: Home / Self Care | Payer: PRIVATE HEALTH INSURANCE | Source: Ambulatory Visit | Attending: Obstetrics and Gynecology | Admitting: Obstetrics and Gynecology

## 2017-10-15 ENCOUNTER — Inpatient Hospital Stay (HOSPITAL_COMMUNITY): Payer: Self-pay

## 2017-10-15 ENCOUNTER — Encounter (HOSPITAL_COMMUNITY): Payer: Self-pay | Admitting: *Deleted

## 2017-10-15 DIAGNOSIS — M549 Dorsalgia, unspecified: Secondary | ICD-10-CM | POA: Insufficient documentation

## 2017-10-15 DIAGNOSIS — N920 Excessive and frequent menstruation with regular cycle: Secondary | ICD-10-CM | POA: Diagnosis not present

## 2017-10-15 DIAGNOSIS — R9389 Abnormal findings on diagnostic imaging of other specified body structures: Secondary | ICD-10-CM | POA: Insufficient documentation

## 2017-10-15 DIAGNOSIS — D500 Iron deficiency anemia secondary to blood loss (chronic): Secondary | ICD-10-CM | POA: Diagnosis not present

## 2017-10-15 DIAGNOSIS — R109 Unspecified abdominal pain: Secondary | ICD-10-CM | POA: Diagnosis not present

## 2017-10-15 DIAGNOSIS — O039 Complete or unspecified spontaneous abortion without complication: Secondary | ICD-10-CM

## 2017-10-15 DIAGNOSIS — N939 Abnormal uterine and vaginal bleeding, unspecified: Secondary | ICD-10-CM | POA: Insufficient documentation

## 2017-10-15 DIAGNOSIS — R51 Headache: Secondary | ICD-10-CM | POA: Insufficient documentation

## 2017-10-15 LAB — CBC
HCT: 31 % — ABNORMAL LOW (ref 36.0–46.0)
Hemoglobin: 9.9 g/dL — ABNORMAL LOW (ref 12.0–15.0)
MCH: 23.9 pg — ABNORMAL LOW (ref 26.0–34.0)
MCHC: 31.9 g/dL (ref 30.0–36.0)
MCV: 74.7 fL — ABNORMAL LOW (ref 78.0–100.0)
Platelets: 272 10*3/uL (ref 150–400)
RBC: 4.15 MIL/uL (ref 3.87–5.11)
RDW: 15.9 % — ABNORMAL HIGH (ref 11.5–15.5)
WBC: 6.4 10*3/uL (ref 4.0–10.5)

## 2017-10-15 LAB — HCG, QUANTITATIVE, PREGNANCY: hCG, Beta Chain, Quant, S: 1084 m[IU]/mL — ABNORMAL HIGH (ref <5)

## 2017-10-15 MED ORDER — OXYCODONE-ACETAMINOPHEN 5-325 MG PO TABS
1.0000 | ORAL_TABLET | Freq: Once | ORAL | Status: AC
  Administered 2017-10-15: 1 via ORAL
  Filled 2017-10-15: qty 1

## 2017-10-15 MED ORDER — ACETAMINOPHEN-CODEINE #3 300-30 MG PO TABS: 1.0000 | ORAL_TABLET | Freq: Four times a day (QID) | ORAL | 0 refills | Status: DC | PRN

## 2017-10-15 MED ORDER — ONDANSETRON HCL 4 MG PO TABS: 4.0000 mg | ORAL_TABLET | Freq: Four times a day (QID) | ORAL | 0 refills | Status: DC | PRN

## 2017-10-15 MED ORDER — FERROUS SULFATE 325 (65 FE) MG PO TBEC: 325.0000 mg | DELAYED_RELEASE_TABLET | Freq: Three times a day (TID) | ORAL | 2 refills | Status: DC

## 2017-10-15 MED ORDER — MISOPROSTOL 200 MCG PO TABS
800.0000 ug | ORAL_TABLET | Freq: Once | ORAL | Status: AC
  Administered 2017-10-15: 800 ug via BUCCAL
  Filled 2017-10-15: qty 4

## 2017-10-15 MED ORDER — ONDANSETRON 8 MG PO TBDP
8.0000 mg | ORAL_TABLET | Freq: Once | ORAL | Status: AC
  Administered 2017-10-15: 8 mg via ORAL
  Filled 2017-10-15: qty 1

## 2017-10-15 NOTE — MAU Provider Note (Signed)
Chief Complaint: Abdominal Pain; Vaginal Bleeding; Back Pain; and Headache   First Provider Initiated Contact with Patient 10/15/17 1405      SUBJECTIVE HPI: Karen Gibbs is a 47 y.o. [id number] at [redacted]w[redacted]d by LMP who presents to maternity admissions reporting continued vaginal bleeding and abdominal pain. Patient was seen in MAU on 10/08/17 for same symptoms. Was in office this morning for repeat labs and came up to MAU following. She was given cytotec on 10/08/17 and HCG was 23,000+. She complains of abdominal pain that is intermittent but describes them as "contractions" when they do occur. She rates the pain 8/10- has taken pain medication prescribed at home with little relief. She reports increased vaginal bleeding and large clots when she goes to the restroom. Reports having to change her pad 5 times today. She denies feeling weak or lightheaded with the excess bleeding. Complains of headache that started today- has not taken any pain medication today. She denies vaginal itching/burning, urinary symptoms, dizziness, n/v, or fever/chills.     Past Medical History:  Diagnosis Date  . Environmental allergies   . Medical history non-contributory    Past Surgical History:  Procedure Laterality Date  . CESAREAN SECTION     C/S x 2   Social History   Socioeconomic History  . Marital status: Married    Spouse name: Not on file  . Number of children: Not on file  . Years of education: Not on file  . Highest education level: Not on file  Social Needs  . Financial resource strain: Not on file  . Food insecurity - worry: Not on file  . Food insecurity - inability: Not on file  . Transportation needs - medical: Not on file  . Transportation needs - non-medical: Not on file  Occupational History  . Not on file  Tobacco Use  . Smoking status: Never Smoker  . Smokeless tobacco: Never Used  Substance and Sexual Activity  . Alcohol use: No  . Drug use: No  . Sexual activity: Yes  Other Topics  Concern  . Not on file  Social History Narrative  . Not on file   No current facility-administered medications on file prior to encounter.    Current Outpatient Medications on File Prior to Encounter  Medication Sig Dispense Refill  . oxyCODONE-acetaminophen (PERCOCET/ROXICET) 5-325 MG tablet Take 2 tablets by mouth every 6 (six) hours as needed for severe pain. 15 tablet 0  . ferrous sulfate 325 (65 FE) MG EC tablet Take 1 tablet (325 mg total) by mouth 3 (three) times daily with meals. (Patient not taking: Reported on 10/15/2017) 90 tablet 0  . Prenatal Vit-Fe Fumarate-FA (PRENATAL/FOLIC ACID) TABS Take 1 tablet by mouth daily. (Patient not taking: Reported on 10/15/2017) 30 each 11   No Known Allergies  ROS:  Review of Systems  Constitutional: Negative.   Respiratory: Negative.   Cardiovascular: Negative.   Gastrointestinal: Positive for abdominal pain. Negative for constipation, diarrhea, nausea and vomiting.  Genitourinary: Positive for vaginal bleeding. Negative for decreased urine volume, difficulty urinating, dysuria, flank pain, frequency and urgency.  Musculoskeletal: Positive for back pain.  Neurological: Positive for headaches. Negative for dizziness, weakness and light-headedness.       Not currently  Psychiatric/Behavioral: Negative.    I have reviewed patient's Past Medical Hx, Surgical Hx, Family Hx, Social Hx, medications and allergies.   Physical Exam   Patient Vitals for the past 24 hrs:  BP Temp Temp src Pulse Resp SpO2 Height Weight  10/15/17 1734 (!) 148/73 - - 73 18 - - -  10/15/17 1200 125/77 98.3 F (36.8 C) Oral 77 17 100 % 5\' 2"  (1.575 m) 217 lb (98.4 kg)   Constitutional: Well-developed, well-nourished female in no acute distress.  Cardiovascular: normal rate Respiratory: normal effort GI: Abd soft, non-tender. Pos BS x 4 MS: Extremities nontender, no edema, normal ROM Neurologic: Alert and oriented x 4.  GU: Neg CVAT.  PELVIC EXAM: Cervix pink,  visually closed, without lesion, moderate dark red vaginal bleeding present surrounding cervix with dark red mucus actively coming from cervix, vaginal walls and external genitalia normal Bimanual exam: Cervix 0/long/high, firm, anterior, neg CMT, uterus nontender, nonenlarged, adnexa without tenderness, enlargement, or mass   LAB RESULTS Results for orders placed or performed during the hospital encounter of 10/15/17 (from the past 24 hour(s))  CBC     Status: Abnormal   Collection Time: 10/15/17  2:43 PM  Result Value Ref Range   WBC 6.4 4.0 - 10.5 K/uL   RBC 4.15 3.87 - 5.11 MIL/uL   Hemoglobin 9.9 (L) 12.0 - 15.0 g/dL   HCT 31.0 (L) 36.0 - 46.0 %   MCV 74.7 (L) 78.0 - 100.0 fL   MCH 23.9 (L) 26.0 - 34.0 pg   MCHC 31.9 30.0 - 36.0 g/dL   RDW 15.9 (H) 11.5 - 15.5 %   Platelets 272 150 - 400 K/uL  hCG, quantitative, pregnancy     Status: Abnormal   Collection Time: 10/15/17  2:43 PM  Result Value Ref Range   hCG, Beta Chain, Quant, S 1,084 (H) <5 mIU/mL    --/--/AB POS Performed at [facility], [address], [place], [place] [address]  [phone])  IMAGING US Ob Transvaginal  Result Date: 10/15/2017 CLINICAL DATA:  Status post miscarriage 1 week ago EXAM: TRANSVAGINAL OB ULTRASOUND TECHNIQUE: Transvaginal ultrasound was performed for complete evaluation of the gestation as well as the maternal uterus, adnexal regions, and pelvic cul-de-sac. COMPARISON:  10/07/2017 FINDINGS: Intrauterine gestational sac: None Maternal uterus/adnexae: Endometrial complex is mildly thickened in the uterine fundus, measuring up to 15 mm. Mild color Doppler flow. Endometrial fibroids, including a dominant 18 mm intramural fibroid in the posterior uterine body. Bilateral ovaries are not discretely visualized. No free fluid. IMPRESSION: Mild focal thickening of the endometrium in the uterine fundus with color Doppler flow. This appearance is indeterminate, but retained products of conception are  difficult to entirely exclude. If intervention is not performed, symptoms persist, consider follow-up pelvic ultrasound in 7-10 days. Electronically Signed   By: Julian Hy M.D.   On: 10/15/2017 16:04   MAU Management/MDM: Orders Placed This Encounter  Procedures  . US OB Transvaginal  . CBC  . hCG, quantitative, pregnancy  . Discharge patient Discharge disposition: 01-Home or Self Care; Discharge patient date: 10/15/2017  CBC- showed decreased HCG to 9.9 HCG- level is decreased drastically from previous level   Meds ordered this encounter  Medications  . misoprostol (CYTOTEC) tablet 800 mcg  . ferrous sulfate 325 (65 FE) MG EC tablet    Sig: Take 1 tablet (325 mg total) by mouth 3 (three) times daily with meals.    Dispense:  90 tablet    Refill:  2    Order Specific Question:   Supervising Provider    Answer:   CONSTANT, PEGGY [[id number]]  . acetaminophen-codeine (TYLENOL #3) 300-30 MG tablet    Sig: Take 1-2 tablets by mouth every 6 (six) hours as needed for moderate  pain.    Dispense:  15 tablet    Refill:  0    Order Specific Question:   Supervising Provider    Answer:   CONSTANT, PEGGY [[id number]]  . oxyCODONE-acetaminophen (PERCOCET/ROXICET) 5-325 MG per tablet 1 tablet  . ondansetron (ZOFRAN-ODT) disintegrating tablet 8 mg  . ondansetron (ZOFRAN) 4 MG tablet    Sig: Take 1 tablet (4 mg total) by mouth every 6 (six) hours as needed for nausea or vomiting.    Dispense:  12 tablet    Refill:  0    Order Specific Question:   Supervising Provider    Answer:   CONSTANT, PEGGY [[id number]]   Consult Dr Constant with assessment, ultrasound and lab results. Recommends 844mcg Cytotec for increased endometrium lining with continued vaginal bleeding. Treatments in MAU included Cytotec 84mcg, 8mg  Zofran ODT, and one Percocet 5-325mg  tablet. Pt discharged with strict bleeding precautions and follow up instructions. Patient agrees to [place] and understands via interpreter.   ASSESSMENT 1.  Excessive vaginal bleeding   2. Abdominal cramping   3. Iron deficiency anemia due to chronic blood loss     PLAN Discharge home Rx for Zofran and Tylenol #3 Follow up as scheduled in the office on Monday  Return to MAU as needed for increased vaginal bleeding and/or dizziness and weakness Rx for Fe refilled to pharmacy on file   Allergies as of 10/15/2017   No Known Allergies     Medication List    TAKE these medications   acetaminophen-codeine 300-30 MG tablet Commonly known as:  TYLENOL #3 Take 1-2 tablets by mouth every 6 (six) hours as needed for moderate pain.   ferrous sulfate 325 (65 FE) MG EC tablet Take 1 tablet (325 mg total) by mouth 3 (three) times daily with meals.   ondansetron 4 MG tablet Commonly known as:  ZOFRAN Take 1 tablet (4 mg total) by mouth every 6 (six) hours as needed for nausea or vomiting.   oxyCODONE-acetaminophen 5-325 MG tablet Commonly known as:  PERCOCET/ROXICET Take 2 tablets by mouth every 6 (six) hours as needed for severe pain.   PRENATAL/FOLIC ACID Tabs Take 1 tablet by mouth daily.      Darrol Poke  Certified Nurse-Midwife 10/15/2017  5:47 PM

## 2017-10-15 NOTE — MAU Note (Signed)
Pt reports heavy bleeding, headache, lower abd pain. S/p miscarriage one week ago and has been bleeding heavy since then.

## 2017-10-15 NOTE — MAU Note (Signed)
Urine in lab 

## 2017-10-16 LAB — BETA HCG QUANT (REF LAB): hCG Quant: 1019 m[IU]/mL

## 2017-10-21 ENCOUNTER — Ambulatory Visit (INDEPENDENT_AMBULATORY_CARE_PROVIDER_SITE_OTHER): Payer: PRIVATE HEALTH INSURANCE | Admitting: Obstetrics & Gynecology

## 2017-10-21 ENCOUNTER — Encounter: Payer: Self-pay | Admitting: Obstetrics & Gynecology

## 2017-10-21 ENCOUNTER — Institutional Professional Consult (permissible substitution): Payer: Medicaid Other

## 2017-10-21 VITALS — BP 120/83 | HR 74 | Ht 62.0 in | Wt 215.0 lb

## 2017-10-21 DIAGNOSIS — Z30011 Encounter for initial prescription of contraceptive pills: Secondary | ICD-10-CM

## 2017-10-21 DIAGNOSIS — O039 Complete or unspecified spontaneous abortion without complication: Secondary | ICD-10-CM | POA: Diagnosis not present

## 2017-10-21 MED ORDER — NORGESTREL-ETHINYL ESTRADIOL 0.3-30 MG-MCG PO TABS: 1.0000 | ORAL_TABLET | Freq: Every day | ORAL | 11 refills | Status: DC

## 2017-10-21 MED FILL — ELINEST-28 TABLET: 0.3-30 | 28 days supply | Qty: 28 | Fill #0

## 2017-10-21 NOTE — Progress Notes (Signed)
Patient ID: Karen Gibbs, female   DOB: 1971/01/09, 47 y.o.   MRN: [id number]  Chief Complaint  Patient presents with  . Follow-up    SAB    HPI Karen Gibbs is a 47 y.o. female.  Married [id number] here for follow up of miscarriage. Her QBHCG reached a max of 33 K, now at 1K as of 10-15-17.  HPI  Past Medical History:  Diagnosis Date  . Environmental allergies   . Medical history non-contributory     Past Surgical History:  Procedure Laterality Date  . CESAREAN SECTION     C/S x 2    No family history on file.  Social History Social History   Tobacco Use  . Smoking status: Never Smoker  . Smokeless tobacco: Never Used  Substance Use Topics  . Alcohol use: No  . Drug use: No    No Known Allergies  Current Outpatient Medications  Medication Sig Dispense Refill  . ferrous sulfate 325 (65 FE) MG EC tablet Take 1 tablet (325 mg total) by mouth 3 (three) times daily with meals. 90 tablet 2   No current facility-administered medications for this visit.     Review of Systems Review of Systems  Blood pressure 120/83, pulse 74, height 5\' 2"  (1.575 m), weight 215 lb (97.5 kg), last menstrual period 07/27/2017, unknown if currently breastfeeding.  Physical Exam Physical Exam  Breathing, conversing, and ambulating normally Well nourished, well hydrated Black female, no apparent distress Video interpretor used for exam   Data Reviewed  Results for Karen Gibbs (MRN [id number]) as of 10/21/2017 11:08  Ref. Range 10/15/2017 11:40  hCG Quant Latest Units: mIU/mL 1,019   Assessment    Miscarriage- doing well at present She says that she does not want more kids, but she is not ready to commit to a BTL. Lo Ovral prescribed as she opts for OCPs    Plan    I will repeat a QBHCG in 2 weeks when her BP is checked since she will start Lo ovral tomorrow.       Emily Filbert 10/21/2017, 10:24 AM

## 2017-10-21 NOTE — Telephone Encounter (Signed)
This encounter was created in error - please disregard.

## 2017-10-25 NOTE — BH Specialist Note (Signed)
Integrated Behavioral Health Initial Visit  MRN: [id number] Name: Karen Gibbs  Number of [place] Clinician visits:: 1/6 Session Start time: 11:45  Session End time: 12:20 Total time: 30 minutes  Type of Service: [place] Interpretor:Yes.   Interpretor Name and Language: Francisca December, French   Warm Hand Off Completed.       SUBJECTIVE: Karen Gibbs is a 47 y.o. female accompanied by Interpreter Patient was referred by Dr Hulan Fray for depression. Patient reports the following symptoms/concerns: Pt states her primary concern today is having little energy after miscarriage, but is worries she may have an infection, along with concern over effectiveness of birth control pills. Pt says she has seen [place] at [facility], and plans to see her again. Pt says she and her husband are getting along better than at her last visit to [facility], and feels safe at home. Pt denies feelings of depression.  Duration of problem: -; Severity of problem: mild  OBJECTIVE: Mood: Appropriate and Affect: Appropriate Risk of harm to self or others: No plan to harm self or others  LIFE CONTEXT: Family and Social: Pt lives with husband and 4 children(18,15,11,9); extended friends/family not local School/Work: Pt and husband both work Self-Care: Pt recognizing a greater need for self-care; spending time with children helps her feel best Life Changes: Recent miscarriage  GOALS ADDRESSED: Patient will: 1. Reduce symptoms of: stress 2. Demonstrate ability to: Increase healthy adjustment to current life circumstances  INTERVENTIONS: Interventions utilized: Supportive Counseling  Standardized Assessments completed: GAD-7 and PHQ 9  ASSESSMENT: Patient currently experiencing Stressful life event affecting family and household.   Patient may benefit from supportive counseling today, and refer to MAU to see medical provider.Marland Kitchen  PLAN: 1. Follow up with  behavioral health clinician on : As requested  2. Behavioral recommendations:  -Continue with plan to see [facility] Jasmine at next medical appointment with [facility] -Go to MAU today, to see medical provider about possible infection, and any questions about birth control -Continue using self-coping strategies discussed with Jasmine at [facility] visit 3. Referral(s): [place] (In Clinic) 4. "From scale of 1-10, how likely are you to follow plan?": 9  Garlan Fair, LCSW   Depression screen [facility] 2/9 10/03/2017 09/24/2017 08/19/2017 06/13/2017 05/01/2017  Decreased Interest 2 1 2  - 1  Down, Depressed, Hopeless 2 2 3  0 1  PHQ - 2 Score 4 3 5  0 2  Altered sleeping 3 3 3 1 1   Tired, decreased energy 3 3 1 1 1   Change in appetite 2 3 3 3 1   Feeling bad or failure about yourself  2 3 2 1  0  Trouble concentrating 2 2 1 1  0  Moving slowly or fidgety/restless 1 0 1 0 0  Suicidal thoughts 1 0 1 0 0  PHQ-9 Score 18 17 17 7 5    GAD 7 : Generalized Anxiety Score 10/03/2017 09/24/2017 08/19/2017 06/13/2017  Nervous, Anxious, on Edge 2 2 1  0  Control/stop worrying 1 2 1  0  Worry too much - different things 2 2 2  0  Trouble relaxing 2 2 1 1   Restless 0 1 0 0  Easily annoyed or irritable 0 0 0 0  Afraid - awful might happen 2 3 2  0  Total GAD 7 Score 9 12 7  1

## 2017-10-28 ENCOUNTER — Other Ambulatory Visit: Payer: Self-pay

## 2017-10-28 ENCOUNTER — Encounter (HOSPITAL_COMMUNITY): Payer: Self-pay | Admitting: *Deleted

## 2017-10-28 ENCOUNTER — Ambulatory Visit (INDEPENDENT_AMBULATORY_CARE_PROVIDER_SITE_OTHER): Payer: PRIVATE HEALTH INSURANCE | Admitting: Clinical

## 2017-10-28 ENCOUNTER — Inpatient Hospital Stay (HOSPITAL_COMMUNITY)
Admission: AD | Admit: 2017-10-28 | Discharge: 2017-10-28 | Disposition: A | Discharge status: Home / Self Care | Payer: PRIVATE HEALTH INSURANCE | Source: Ambulatory Visit | Attending: Obstetrics and Gynecology | Admitting: Obstetrics and Gynecology

## 2017-10-28 DIAGNOSIS — Z6379 Other stressful life events affecting family and household: Secondary | ICD-10-CM

## 2017-10-28 DIAGNOSIS — N760 Acute vaginitis: Secondary | ICD-10-CM | POA: Diagnosis present

## 2017-10-28 DIAGNOSIS — B9689 Other specified bacterial agents as the cause of diseases classified elsewhere: Secondary | ICD-10-CM | POA: Insufficient documentation

## 2017-10-28 HISTORY — DX: Benign neoplasm of connective and other soft tissue, unspecified: D21.9

## 2017-10-28 HISTORY — DX: Anemia, unspecified: D64.9

## 2017-10-28 LAB — URINALYSIS, ROUTINE W REFLEX MICROSCOPIC
Bacteria, UA: NONE SEEN
Bilirubin Urine: NEGATIVE
Glucose, UA: NEGATIVE mg/dL
Hgb urine dipstick: NEGATIVE
Ketones, ur: NEGATIVE mg/dL
Nitrite: NEGATIVE
Protein, ur: NEGATIVE mg/dL
Specific Gravity, Urine: 1.019 (ref 1.005–1.030)
pH: 8 (ref 5.0–8.0)

## 2017-10-28 LAB — WET PREP, GENITAL
Sperm: NONE SEEN
Trich, Wet Prep: NONE SEEN
Yeast Wet Prep HPF POC: NONE SEEN

## 2017-10-28 MED ORDER — METRONIDAZOLE 500 MG PO TABS: 500.0000 mg | ORAL_TABLET | Freq: Two times a day (BID) | ORAL | 0 refills | Status: AC

## 2017-10-28 NOTE — MAU Provider Note (Signed)
History     CSN: [id number]  Arrival date and time: 10/28/17 1237   First Provider Initiated Contact with Patient 10/28/17 1442 - assessment and exam by audio [place] interpreter [phone]; Discharge instructions given with assistance of [employer] video interpreter Sharman Crate [phone]    Chief Complaint  Patient presents with  . Abdominal Pain  . Vaginal Discharge  . Nausea   HPI  Ms.  Karen Gibbs is a 47 y.o. year old [id number] non-pregnant female who presents to MAU reporting she had a miscarriage 4 wks ago and a c/o yellow vaginal d/c with a bad odor. She is concerned she may have an infection. She is still having pain in her lower abdomen and some nausea.  She has not had SI recently d/t "not being cleared by doctor".  Past Medical History:  Diagnosis Date  . Anemia   . Environmental allergies   . Fibroid   . Medical history non-contributory     Past Surgical History:  Procedure Laterality Date  . CESAREAN SECTION     C/S x 2    History reviewed. No pertinent family history.  Social History   Tobacco Use  . Smoking status: Never Smoker  . Smokeless tobacco: Never Used  Substance Use Topics  . Alcohol use: No  . Drug use: No    Allergies: No Known Allergies  Medications Prior to Admission  Medication Sig Dispense Refill Last Dose  . ferrous sulfate 325 (65 FE) MG EC tablet Take 1 tablet (325 mg total) by mouth 3 (three) times daily with meals. 90 tablet 2 10/27/2017 at Unknown time  . norgestrel-ethinyl estradiol (LO/OVRAL,CRYSELLE) 0.3-30 MG-MCG tablet Take 1 tablet by mouth daily. 1 Package 11 10/28/2017 at Unknown time  . Prenatal Vit-Fe Fumarate-FA (PRENATAL MULTIVITAMIN) TABS tablet Take 1 tablet by mouth daily at 12 noon.   10/27/2017 at Unknown time    Review of Systems  Constitutional: Negative.   HENT: Negative.   Eyes: Negative.   Respiratory: Negative.   Cardiovascular: Negative.   Gastrointestinal: Positive for abdominal pain.  Endocrine: Negative.    Genitourinary: Positive for vaginal discharge.  Musculoskeletal: Negative.   Skin: Negative.   Allergic/Immunologic: Negative.   Neurological: Negative.   Hematological: Negative.   Psychiatric/Behavioral: Negative.    Physical Exam   Blood pressure 123/80, pulse 73, temperature 98.5 F (36.9 C), temperature source Oral, resp. rate 18, weight 214 lb 12 oz (97.4 kg), SpO2 100 %.  Physical Exam  Nursing note and vitals reviewed. Constitutional: She is oriented to person, place, and time. She appears well-developed and well-nourished.  HENT:  Head: Normocephalic and atraumatic.  Eyes: Pupils are equal, round, and reactive to light.  Neck: Normal range of motion.  Cardiovascular: Normal rate, regular rhythm and normal heart sounds.  Respiratory: Effort normal and breath sounds normal.  GI: Soft. Bowel sounds are normal.  Genitourinary:  Genitourinary Comments: Uterus: non-tender, SE: cervix is smooth, pink, no lesions, moderate amt of thick, yellowish-white vaginal d/c, closed/long/firm, no CMT or friability, mild bilateral adnexal tenderness   Musculoskeletal: Normal range of motion.  Neurological: She is alert and oriented to person, place, and time.  Skin: Skin is warm and dry.  Psychiatric: She has a normal mood and affect. Her behavior is normal. Judgment and thought content normal.    MAU Course  Procedures  MDM CCUA Wet Prep GC/CT -- pending HIV -- pending Offered Ibuprofen -- pt declined  Results for orders placed or performed during the hospital encounter of 10/28/17 (  from the past 24 hour(s))  Urinalysis, Routine w reflex microscopic     Status: Abnormal   Collection Time: 10/28/17 12:54 PM  Result Value Ref Range   Color, Urine YELLOW YELLOW   APPearance HAZY (A) CLEAR   Specific Gravity, Urine 1.019 1.005 - 1.030   pH 8.0 5.0 - 8.0   Glucose, UA NEGATIVE NEGATIVE mg/dL   Hgb urine dipstick NEGATIVE NEGATIVE   Bilirubin Urine NEGATIVE NEGATIVE   Ketones,  ur NEGATIVE NEGATIVE mg/dL   Protein, ur NEGATIVE NEGATIVE mg/dL   Nitrite NEGATIVE NEGATIVE   Leukocytes, UA TRACE (A) NEGATIVE   RBC / HPF 0-5 0 - 5 RBC/hpf   WBC, UA 0-5 0 - 5 WBC/hpf   Bacteria, UA NONE SEEN NONE SEEN   Squamous Epithelial / LPF 6-30 (A) NONE SEEN   Mucus PRESENT   Wet prep, genital     Status: Abnormal   Collection Time: 10/28/17  3:14 PM  Result Value Ref Range   Yeast Wet Prep HPF POC NONE SEEN NONE SEEN   Trich, Wet Prep NONE SEEN NONE SEEN   Clue Cells Wet Prep HPF POC PRESENT (A) NONE SEEN   WBC, Wet Prep HPF POC MANY (A) NONE SEEN   Sperm NONE SEEN     Assessment and Plan  Bacterial vaginitis  - Rx for Flagyl 500 mg BID x 7 days sent - Information provided on BV in English (not available in [place] - pt can have them translated)  - Keep scheduled appt on Monday 3/4 - Patient verbalized an understanding of the plan of care and agrees.   Laury Deep, MSN, CNM 10/28/2017, 3:55 PM

## 2017-10-28 NOTE — Discharge Instructions (Signed)
Please have someone interpret these instructions for you as we do have them in [place].

## 2017-10-28 NOTE — MAU Note (Signed)
Urine in lab 

## 2017-10-28 NOTE — MAU Note (Signed)
Had a miscarriage. Has a d/c now it is yellow, is concerned she may have an infection.  Still having pain in her lower abd, and some nausea.

## 2017-10-29 LAB — GC/CHLAMYDIA PROBE AMP (~~LOC~~) NOT AT ARMC
Chlamydia: NEGATIVE
Neisseria Gonorrhea: NEGATIVE

## 2017-10-29 LAB — HIV ANTIBODY (ROUTINE TESTING W REFLEX): HIV Screen 4th Generation wRfx: NONREACTIVE

## 2017-10-29 MED FILL — ?METRONIDAZOLE 500MG TABS: 500 | 7 days supply | Qty: 14 | Fill #0

## 2017-11-04 ENCOUNTER — Encounter: Payer: Self-pay | Admitting: Advanced Practice Midwife

## 2017-11-04 ENCOUNTER — Ambulatory Visit: Payer: Self-pay | Admitting: *Deleted

## 2017-11-04 VITALS — BP 118/57 | HR 77

## 2017-11-04 DIAGNOSIS — O039 Complete or unspecified spontaneous abortion without complication: Secondary | ICD-10-CM

## 2017-11-04 NOTE — Progress Notes (Signed)
I have reviewed this chart and agree with the RN/CMA assessment and management.    Afsheen Antony C Gorge Almanza, MD, FACOG Attending Physician, [facility]  

## 2017-11-04 NOTE — Progress Notes (Signed)
Here for bp check and non stat bhcg following miscarriage. Denies any bleeding since 10/28/17. deines any pain.

## 2017-11-05 LAB — BETA HCG QUANT (REF LAB): hCG Quant: 21 m[IU]/mL

## 2017-11-13 MED FILL — ELINEST-28 TABLET: 0.3-30 | 28 days supply | Qty: 28 | Fill #1

## 2017-11-25 ENCOUNTER — Ambulatory Visit: Payer: Medicaid Other | Attending: Internal Medicine

## 2017-12-11 ENCOUNTER — Encounter: Payer: Self-pay | Admitting: Nurse Practitioner

## 2017-12-11 ENCOUNTER — Ambulatory Visit: Payer: PRIVATE HEALTH INSURANCE | Attending: Nurse Practitioner | Admitting: Nurse Practitioner

## 2017-12-11 ENCOUNTER — Other Ambulatory Visit (HOSPITAL_COMMUNITY)
Admission: RE | Admit: 2017-12-11 | Discharge: 2017-12-11 | Disposition: A | Discharge status: Home / Self Care | Payer: Medicaid Other | Source: Ambulatory Visit | Attending: Nurse Practitioner | Admitting: Nurse Practitioner

## 2017-12-11 ENCOUNTER — Other Ambulatory Visit (HOSPITAL_COMMUNITY)
Admission: RE | Admit: 2017-12-11 | Discharge: 2017-12-11 | Disposition: A | Discharge status: Home / Self Care | Payer: Medicaid Other | Source: Ambulatory Visit | Attending: Certified Nurse Midwife | Admitting: Certified Nurse Midwife

## 2017-12-11 VITALS — BP 137/93 | HR 78 | Temp 98.6°F | Ht 62.0 in | Wt 215.8 lb

## 2017-12-11 DIAGNOSIS — R110 Nausea: Secondary | ICD-10-CM | POA: Diagnosis not present

## 2017-12-11 DIAGNOSIS — N921 Excessive and frequent menstruation with irregular cycle: Secondary | ICD-10-CM | POA: Insufficient documentation

## 2017-12-11 DIAGNOSIS — Z3009 Encounter for other general counseling and advice on contraception: Secondary | ICD-10-CM

## 2017-12-11 DIAGNOSIS — D500 Iron deficiency anemia secondary to blood loss (chronic): Secondary | ICD-10-CM | POA: Insufficient documentation

## 2017-12-11 DIAGNOSIS — N760 Acute vaginitis: Secondary | ICD-10-CM | POA: Diagnosis present

## 2017-12-11 MED ORDER — FERROUS SULFATE 325 (65 FE) MG PO TBEC: 325.0000 mg | DELAYED_RELEASE_TABLET | Freq: Three times a day (TID) | ORAL | 2 refills | Status: DC

## 2017-12-11 MED FILL — FERROUS SULFATE 325 MG TAB: 325 (65 FE) | 30 days supply | Qty: 90 | Fill #0

## 2017-12-11 NOTE — Patient Instructions (Signed)
Nausea, Adult  Nausea is the feeling of an upset stomach or having to vomit. Nausea on its own is not usually a serious concern, but it may be an early sign of a more serious medical problem. As nausea gets worse, it can lead to vomiting. If vomiting develops, or if you are not able to drink enough fluids, you are at risk of becoming dehydrated. Dehydration can make you tired and thirsty, cause you to have a dry mouth, and decrease how often you urinate. Older adults and people with other diseases or a weak immune system are at higher risk for dehydration. The main goals of treating your nausea are:   To limit repeated nausea episodes.   To prevent vomiting and dehydration.    Follow these instructions at home:  Follow instructions from your health care provider about how to care for yourself at home.  Eating and drinking  Follow these recommendations as told by your health care provider:   Take an oral rehydration solution (ORS). This is a drink that is sold at pharmacies and retail stores.   Drink clear fluids in small amounts as you are able. Clear fluids include water, ice chips, diluted fruit juice, and low-calorie sports drinks.   Eat bland, easy-to-digest foods in small amounts as you are able. These foods include bananas, applesauce, rice, lean meats, toast, and crackers.   Avoid drinking fluids that contain a lot of sugar or caffeine, such as energy drinks, sports drinks, and soda.   Avoid alcohol.   Avoid spicy or fatty foods.    General instructions   Drink enough fluid to keep your urine clear or pale yellow.   Wash your hands often. If soap and water are not available, use hand sanitizer.   Make sure that all people in your household wash their hands well and often.   Rest at home while you recover.   Take over-the-counter and prescription medicines only as told by your health care provider.   Breathe slowly and deeply when you feel nauseous.   Watch your condition for any  changes.   Keep all follow-up visits as told by your health care provider. This is important.  Contact a health care provider if:   You have a headache.   You have new symptoms.   Your nausea gets worse.   You have a fever.   You feel light-headed or dizzy.   You vomit.   You cannot keep fluids down.  Get help right away if:   You have pain in your chest, neck, arm, or jaw.   You feel extremely weak or you faint.   You have vomit that is bright red or looks like coffee grounds.   You have bloody or black stools or stools that look like tar.   You have a severe headache, a stiff neck, or both.   You have severe pain, cramping, or bloating in your abdomen.   You have a rash.   You have difficulty breathing or are breathing very quickly.   Your heart is beating very quickly.   Your skin feels cold and clammy.   You feel confused.   You have pain when you urinate.   You have signs of dehydration, such as:  ? Dark urine, very little, or no urine.  ? Cracked lips.  ? Dry mouth.  ? Sunken eyes.  ? Sleepiness.  ? Weakness.  These symptoms may represent a serious problem that is an emergency. Do not wait   to see if the symptoms will go away. Get medical help right away. Call your local emergency services (911 in the [place] Do not drive yourself to the hospital.  This information is not intended to replace advice given to you by your health care provider. Make sure you discuss any questions you have with your health care provider.  Document Released: 09/27/2004 Document Revised: 01/23/2016 Document Reviewed: 04/26/2015  Elsevier Interactive Patient Education  2018 Elsevier Inc[employer]

## 2017-12-11 NOTE — Progress Notes (Signed)
Assessment & Plan:  Karen Gibbs was seen today for establish care, nausea and oligomenorrhea.  Diagnoses and all orders for this visit:  Acute vaginitis -     Cervicovaginal ancillary only  Nausea Take birth control pills at night instead of during the day. Make sure you do not take them on an empty stomach.    General counseling and advice on female contraception Follow up at next office visit for recommendations.   Iron deficiency anemia due to chronic blood loss -     ferrous sulfate 325 (65 FE) MG EC tablet; Take 1 tablet (325 mg total) by mouth 3 (three) times daily with meals.    Patient has been counseled on age-appropriate routine health concerns for screening and prevention. These are reviewed and up-to-date. Referrals have been placed accordingly. Immunizations are up-to-date or declined.    Subjective:   Chief Complaint  Patient presents with  . Establish Care  . Nausea    Pt. stated she feel nauseous.   . Oligomenorrhea    Pt. stated she is having a strange bleeding and discharge after her menstrual cycle.    HPI Karen Gibbs 47 y.o. female presents to office today to establish care. She recently suffered a miscarriage 10-2017. She was started on OCP Lo-Ovral at her follow up office visit with GYN.  VRI was used to communicate directly with patient for the entire encounter including providing detailed patient instructions.    Contraception Counseling Patient presents for contraception counseling. The patient endorses significant nausea despite taking her OCPs at night. The nausea lasts for several hours. She endorses 3 episodes of vomiitng since startint her OCPs 2 months ago (fibroids and metrorrhagia).  The patient is sexually active. Pertinent past medical history: none. Her blood pressure is slightly elevated. Will continue to monitor. She may need to stop taking if it con 1 tinues to rise.  Discussed options today she would like to remain on her oral contraceptives  at least 1 more month to ascertain if her symptoms will resolve.  If symptoms have not resolved by then she will decide if she would like to switch to another oral contraceptive or if she would like to be referred back to gynecology for additional options including IUD, BTL. BP Readings from Last 3 Encounters:  12/11/17 (!) 137/93  11/04/17 (!) 118/57  10/28/17 133/80   Vaginitis Patient complains of an abnormal vaginal discharge for a few weeks. Vaginal symptoms include discharge described as clear and odor. Vulvar symptoms include none. STI Risk: Very low risk of STD exposureDischarge described as: copious and yellow.Other associated symptoms: none.Menstrual pattern: She had been bleeding irregularly since miscarriage. Contraception: OCP (estrogen/progesterone) She was recently treated for BV on 10-28-2017. She reports completion of prescription.   Review of Systems  Constitutional: Negative for fever, malaise/fatigue and weight loss.  HENT: Negative.  Negative for nosebleeds.   Eyes: Negative.  Negative for blurred vision, double vision and photophobia.       Cyst on right eye  Respiratory: Negative.  Negative for cough and shortness of breath.   Cardiovascular: Negative.  Negative for chest pain, palpitations and leg swelling.  Gastrointestinal: Positive for nausea and vomiting. Negative for heartburn.  Genitourinary:       SEE HPI  Musculoskeletal: Negative.  Negative for myalgias.  Neurological: Negative.  Negative for dizziness, focal weakness, seizures and headaches.  Psychiatric/Behavioral: Negative.  Negative for suicidal ideas.    Past Medical History:  Diagnosis Date  . Anemia   .  Environmental allergies   . Fibroid   . Medical history non-contributory     Past Surgical History:  Procedure Laterality Date  . CESAREAN SECTION     C/S x 2    History reviewed. No pertinent family history.  Social History Reviewed with no changes to be made today.   Outpatient  Medications Prior to Visit  Medication Sig Dispense Refill  . norgestrel-ethinyl estradiol (LO/OVRAL,CRYSELLE) 0.3-30 MG-MCG tablet Take 1 tablet by mouth daily. 1 Package 11  . Prenatal Vit-Fe Fumarate-FA (PRENATAL MULTIVITAMIN) TABS tablet Take 1 tablet by mouth daily at 12 noon.    . ferrous sulfate 325 (65 FE) MG EC tablet Take 1 tablet (325 mg total) by mouth 3 (three) times daily with meals. (Patient not taking: Reported on 12/11/2017) 90 tablet 2   No facility-administered medications prior to visit.     No Known Allergies     Objective:    BP (!) 137/93 (BP Location: Left Arm, Patient Position: Sitting, Cuff Size: Large)   Pulse 78   Temp 98.6 F (37 C) (Oral)   Ht 5\' 2"  (1.575 m)   Wt 215 lb 12.8 oz (97.9 kg)   LMP 11/16/2017   SpO2 99%   BMI 39.47 kg/m  Wt Readings from Last 3 Encounters:  12/11/17 215 lb 12.8 oz (97.9 kg)  10/28/17 214 lb 12 oz (97.4 kg)  10/21/17 215 lb (97.5 kg)    Physical Exam  Constitutional: She is oriented to person, place, and time. She appears well-developed and well-nourished. She is cooperative.  HENT:  Head: Normocephalic and atraumatic.  Eyes: EOM are normal. Right eye exhibits no chemosis, no discharge, no exudate and no hordeolum. No foreign body present in the right eye.    Neck: Normal range of motion.  Cardiovascular: Normal rate, regular rhythm, normal heart sounds and intact distal pulses. Exam reveals no gallop and no friction rub.  No murmur heard. Pulmonary/Chest: Effort normal and breath sounds normal. No tachypnea. No respiratory distress. She has no decreased breath sounds. She has no wheezes. She has no rhonchi. She has no rales. She exhibits no tenderness.  Abdominal: Soft. Bowel sounds are normal.  Musculoskeletal: Normal range of motion. She exhibits no edema.  Neurological: She is alert and oriented to person, place, and time. Coordination normal.  Skin: Skin is warm and dry.  Psychiatric: She has a normal mood and  affect. Her behavior is normal. Judgment and thought content normal.  Nursing note and vitals reviewed.     Patient has been counseled extensively about nutrition and exercise as well as the importance of adherence with medications and regular follow-up. The patient was given clear instructions to go to ER or return to medical center if symptoms don't improve, worsen or new problems develop. The patient verbalized understanding.   Follow-up: Return in about 5 weeks (around 01/15/2018) for [facility]; f/u nausea and BP check/right eye cyst.   Gildardo Pounds, FNP-BC [facility] [place], [place]   12/11/2017, 12:49 PM

## 2017-12-12 LAB — CERVICOVAGINAL ANCILLARY ONLY
Bacterial vaginitis: NEGATIVE
Candida vaginitis: NEGATIVE
Chlamydia: NEGATIVE
Neisseria Gonorrhea: NEGATIVE
Trichomonas: NEGATIVE

## 2017-12-16 ENCOUNTER — Telehealth: Payer: Self-pay

## 2017-12-16 NOTE — Telephone Encounter (Signed)
-----   Message from Gildardo Pounds, NP sent at 12/15/2017  2:12 PM EDT ----- Results are negative for any bacteria, yeast, chlamydia or gonorrhea

## 2017-12-16 NOTE — Telephone Encounter (Signed)
CMA attempt to call patient to inform on results.  No answer and left a VM for patient to call back, to inform on lab results. Patient will need a french interpreter.    If patient call back, please inform:  Results are negative for any bacteria, yeast, chlamydia or gonorrhea

## 2017-12-18 MED FILL — ELINEST-28 TABLET: 0.3-30 | 28 days supply | Qty: 28 | Fill #2

## 2018-01-15 ENCOUNTER — Ambulatory Visit (INDEPENDENT_AMBULATORY_CARE_PROVIDER_SITE_OTHER): Payer: Self-pay | Admitting: Nurse Practitioner

## 2018-01-16 ENCOUNTER — Ambulatory Visit (INDEPENDENT_AMBULATORY_CARE_PROVIDER_SITE_OTHER): Payer: Self-pay | Admitting: Nurse Practitioner

## 2018-01-21 ENCOUNTER — Other Ambulatory Visit: Payer: Self-pay

## 2018-01-21 ENCOUNTER — Ambulatory Visit (INDEPENDENT_AMBULATORY_CARE_PROVIDER_SITE_OTHER): Payer: PRIVATE HEALTH INSURANCE | Admitting: Nurse Practitioner

## 2018-01-21 ENCOUNTER — Encounter (INDEPENDENT_AMBULATORY_CARE_PROVIDER_SITE_OTHER): Payer: Self-pay | Admitting: Nurse Practitioner

## 2018-01-21 VITALS — BP 128/83 | HR 70 | Temp 98.4°F | Wt 215.6 lb

## 2018-01-21 DIAGNOSIS — R030 Elevated blood-pressure reading, without diagnosis of hypertension: Secondary | ICD-10-CM | POA: Diagnosis not present

## 2018-01-21 DIAGNOSIS — Z3009 Encounter for other general counseling and advice on contraception: Secondary | ICD-10-CM

## 2018-01-21 DIAGNOSIS — H5789 Other specified disorders of eye and adnexa: Secondary | ICD-10-CM | POA: Diagnosis not present

## 2018-01-21 NOTE — Progress Notes (Signed)
Assessment & Plan:  [name] was seen today for follow-up.  Diagnoses and all orders for this visit:  Elevated blood pressure reading Resolved  Encounter for counseling regarding contraception -     Ambulatory referral to Gynecology  Cyst of eye -     Ambulatory referral to General Surgery   Patient has been counseled on age-appropriate routine health concerns for screening and prevention. These are reviewed and up-to-date. Referrals have been placed accordingly. Immunizations are up-to-date or declined.    Subjective:   Chief Complaint  Patient presents with  . Follow-up    abdominal pain/nausea   HPI Karen Gibbs 47 y.o. female presents to office today for blood pressure follow up.  VRI was used to communicate directly with patient for the entire encounter including providing detailed patient instructions.    Elevated Blood Pressure  She was noted for increased blood pressure after being placed on oral contraceptives a few months ago. Today she is normotensive however she has also stopped taking OCPs. States she ran out and forgot to pick up her pills from the pharmacy. She endorses significant nausea with taking the OCPs and at this time does not wish to restart any other OCPs. Denies chest pain, shortness of breath, palpitations, lightheadedness, dizziness, headaches or BLE edema.  BP Readings from Last 3 Encounters:  01/21/18 128/83  12/11/17 (!) 137/93  11/04/17 (!) 118/57   Contraception Counseling Patient presents for contraception counseling. The patient is sexually active. Pertinent past medical history: none.  Is currently not on any form of contraception.  She would like to have the IUD placed. I will have her referred to GYN. She continues to endorse mild abdominal pain which has been present intermittently since her miscarriage in February of this year. Patient has complaints of mild abdominal pain today.  Last [place] (transvaginal) on 10-15-2017 Mild focal  thickening of the endometrium in the uterine fundus with color Doppler flow.  This appearance is indeterminate, but retained products of conception are difficult to entirely exclude.  If intervention is not performed, symptoms persist, consider follow-up pelvic ultrasound in 7 to 10 days. Patient did not have a follow-up ultrasound.  She has declined an ultrasound today that I have offered.  She would like to defer that to gynecology prior to her IUD placement.    Review of Systems  Constitutional: Negative for fever, malaise/fatigue and weight loss.  HENT: Negative.  Negative for nosebleeds.   Eyes: Negative for blurred vision, double vision, photophobia, pain, discharge and redness.       Cyst on canthus of eye.   Respiratory: Negative.  Negative for cough and shortness of breath.   Cardiovascular: Negative.  Negative for chest pain, palpitations and leg swelling.  Gastrointestinal: Positive for abdominal pain. Negative for heartburn, nausea and vomiting.  Musculoskeletal: Negative.  Negative for myalgias.  Neurological: Negative.  Negative for dizziness, focal weakness, seizures and headaches.  Psychiatric/Behavioral: Negative.  Negative for suicidal ideas.    Past Medical History:  Diagnosis Date  . Anemia   . Environmental allergies   . Fibroid   . Medical history non-contributory     Past Surgical History:  Procedure Laterality Date  . CESAREAN SECTION     C/S x 2    No family history on file.  Social History Reviewed with no changes to be made today.   Outpatient Medications Prior to Visit  Medication Sig Dispense Refill  . ferrous sulfate 325 (65 FE) MG EC tablet Take 1 tablet (  325 mg total) by mouth 3 (three) times daily with meals. 90 tablet 2  . Prenatal Vit-Fe Fumarate-FA (PRENATAL MULTIVITAMIN) TABS tablet Take 1 tablet by mouth daily at 12 noon.    . norgestrel-ethinyl estradiol (LO/OVRAL,CRYSELLE) 0.3-30 MG-MCG tablet Take 1 tablet by mouth daily. (Patient not  taking: Reported on 01/21/2018) 1 Package 11   No facility-administered medications prior to visit.     No Known Allergies     Objective:    BP 128/83 (BP Location: Left Arm, Patient Position: Sitting, Cuff Size: Large)   Pulse 70   Temp 98.4 F (36.9 C) (Oral)   Wt 215 lb 9.6 oz (97.8 kg)   LMP 01/13/2018 (Exact Date)   SpO2 98%   Breastfeeding? No   BMI 39.43 kg/m  Wt Readings from Last 3 Encounters:  01/21/18 215 lb 9.6 oz (97.8 kg)  12/11/17 215 lb 12.8 oz (97.9 kg)  10/28/17 214 lb 12 oz (97.4 kg)    Physical Exam  Constitutional: She is oriented to person, place, and time. She appears well-developed and well-nourished. She is cooperative.  HENT:  Head: Normocephalic and atraumatic.  Eyes: EOM are normal.    Neck: Normal range of motion.  Cardiovascular: Normal rate, regular rhythm, normal heart sounds and intact distal pulses. Exam reveals no gallop and no friction rub.  No murmur heard. Pulmonary/Chest: Effort normal and breath sounds normal. No tachypnea. No respiratory distress. She has no decreased breath sounds. She has no wheezes. She has no rhonchi. She has no rales. She exhibits no tenderness.  Abdominal: Soft. Bowel sounds are normal. There is generalized tenderness.  Musculoskeletal: Normal range of motion. She exhibits no edema.  Neurological: She is alert and oriented to person, place, and time. Coordination normal.  Skin: Skin is warm and dry.  Psychiatric: She has a normal mood and affect. Her behavior is normal. Judgment and thought content normal.  Nursing note and vitals reviewed.      Patient has been counseled extensively about nutrition and exercise as well as the importance of adherence with medications and regular follow-up. The patient was given clear instructions to go to ER or return to medical center if symptoms don't improve, worsen or new problems develop. The patient verbalized understanding.   Follow-up: Return in about 3 months  (around 04/23/2018), or if symptoms worsen or fail to improve.   Gildardo Pounds, FNP-BC [facility], [place]   01/21/2018, 12:14 PM

## 2018-03-11 ENCOUNTER — Ambulatory Visit: Payer: Medicaid Other | Admitting: Obstetrics & Gynecology

## 2018-04-11 ENCOUNTER — Ambulatory Visit: Payer: Self-pay | Attending: Family Medicine

## 2018-04-14 ENCOUNTER — Ambulatory Visit: Payer: Medicaid Other | Admitting: Nurse Practitioner

## 2018-04-22 ENCOUNTER — Ambulatory Visit: Payer: PRIVATE HEALTH INSURANCE | Attending: Family Medicine | Admitting: Family Medicine

## 2018-04-22 ENCOUNTER — Encounter: Payer: Self-pay | Admitting: Family Medicine

## 2018-04-22 VITALS — BP 125/74 | HR 71 | Temp 98.5°F | Resp 18 | Ht 64.0 in | Wt 215.0 lb

## 2018-04-22 DIAGNOSIS — Z23 Encounter for immunization: Secondary | ICD-10-CM

## 2018-04-22 DIAGNOSIS — N921 Excessive and frequent menstruation with irregular cycle: Secondary | ICD-10-CM | POA: Insufficient documentation

## 2018-04-22 DIAGNOSIS — L989 Disorder of the skin and subcutaneous tissue, unspecified: Secondary | ICD-10-CM | POA: Insufficient documentation

## 2018-04-22 DIAGNOSIS — N926 Irregular menstruation, unspecified: Secondary | ICD-10-CM

## 2018-04-22 DIAGNOSIS — D259 Leiomyoma of uterus, unspecified: Secondary | ICD-10-CM | POA: Insufficient documentation

## 2018-04-22 DIAGNOSIS — R1030 Lower abdominal pain, unspecified: Secondary | ICD-10-CM

## 2018-04-22 DIAGNOSIS — D500 Iron deficiency anemia secondary to blood loss (chronic): Secondary | ICD-10-CM

## 2018-04-22 LAB — POCT URINALYSIS DIP (CLINITEK)
Bilirubin, UA: NEGATIVE
Blood, UA: NEGATIVE
Glucose, UA: NEGATIVE mg/dL
Ketones, POC UA: NEGATIVE mg/dL
Leukocytes, UA: NEGATIVE
Nitrite, UA: NEGATIVE
POC,PROTEIN,UA: NEGATIVE
Spec Grav, UA: 1.02
Urobilinogen, UA: 0.2 U/dL
pH, UA: 7

## 2018-04-22 MED ORDER — FERROUS SULFATE 325 (65 FE) MG PO TBEC: 325.0000 mg | DELAYED_RELEASE_TABLET | Freq: Three times a day (TID) | ORAL | 4 refills | Status: DC

## 2018-04-22 MED FILL — FERROUS SULFATE 325 MG TAB: 325 (65 FE) | 30 days supply | Qty: 90 | Fill #0

## 2018-04-22 NOTE — Progress Notes (Signed)
Subjective:    Patient ID: Karen Gibbs, female    DOB: 27-Mar-1971, 47 y.o.   MRN: [id number]  Due to a language barrier, a [place] interpreter was used by video during today's visit.  HPI       47 yo female with a history of uterine fibroids and anemia is seen in follow-up and is interested in having an IUD since she was unable to tolerate the use of oral contraceptives due to recurrent nausea and vomiting.  Patient reports that she did have a miscarriage in March of this year and she would like to have placement of an IUD as soon as possible to prevent another pregnancy.  Patient reports that her menses are heavy and irregular.  Patient has been anemic in the past and does have some current fatigue.  Patient is not currently taking an iron supplement.  Patient also reports that she has some lower abdominal pain/discomfort but she states that this is been present since she had a miscarriage.  Patient denies dysuria or urinary frequency.  Patient denies fever or chills.      Patient also with complaint of a small growth at the corner of her right eye.  Patient states that this is been gradually enlarging over the last few months.  Patient states that it is interfering with her vision and patient would like to have this removed.  Area is not tender.  Past Medical History:  Diagnosis Date  . Anemia   . Environmental allergies   . Fibroid   . 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 CESAREAN SECTION     C/S x 2  Family History: Patient states her mother is deceased. Family history is negative for DM, HTN, CAD. No known cancer in immediate family members.  Social History   Tobacco Use  . Smoking status: Never Smoker  . Smokeless tobacco: Never Used  Substance Use Topics  . Alcohol use: No  .  Drug use: No   No Known Allergies    Review of Systems  Constitutional: Positive for fatigue. Negative for chills and fever.  HENT: Negative for sore throat and trouble swallowing.   Respiratory: Negative for cough and shortness of breath.   Cardiovascular: Negative for chest pain, palpitations and leg swelling.  Gastrointestinal: Positive for abdominal pain. Negative for nausea.  Genitourinary: Negative for dysuria and frequency.  Musculoskeletal: Negative for back pain and gait problem.  Allergic/Immunologic: Positive for environmental allergies. Negative for food allergies.  Neurological: Negative for dizziness and headaches.       Objective:   Physical Exam  Constitutional: She appears well-developed and well-nourished. No distress.  Eyes: Conjunctivae and EOM are normal.  Neck: Normal range of motion. Neck supple.  Cardiovascular: Normal rate and regular rhythm.  Pulmonary/Chest: Breath sounds normal.  Abdominal: Soft. There is tenderness (Mild suprapubic discomfort to palpation). There is no rebound and no guarding.  Musculoskeletal: She exhibits no edema or tenderness.  No CVA tenderness on exam  Nursing note and vitals reviewed.  BP 125/74 (BP Location: Right Arm, Patient Position: Sitting, Cuff Size: Large)   Pulse 71   Temp 98.5 F (36.9 C) (Oral)   Resp 18   Ht 5\' 4"  (1.626 m)   Wt 215 lb (97.5 kg)   LMP 04/04/2018   SpO2  100%   BMI 36.90 kg/m      Assessment & Plan:  1. Irregular menses Patient with complaint of history of fibroids and now with irregular, heavy menses and lower abdominal discomfort.  Patient also wishes to have placement of an IUD or other form of contraceptive other than birth control pills for prevention of pregnancy.  Patient will be referred to GYN for further evaluation and treatment - Ambulatory referral to Gynecology  2. Lower abdominal pain Patient with lower abdominal pain and patient had a urinalysis to look for possible urinary  tract infection.  Urinalysis was unremarkable for possible infection.  Patient was made aware of the results. - POCT URINALYSIS DIP (CLINITEK)  3. Iron deficiency anemia due to chronic blood loss Patient with history of iron deficiency anemia due to chronic blood loss from menses.  Patient will have CBC in follow-up of anemia and patient was provided with refill of her ferrous sulfate - CBC with Differential - ferrous sulfate 325 (65 FE) MG EC tablet; Take 1 tablet (325 mg total) by mouth 3 (three) times daily with meals.  Dispense: 90 tablet; Refill: 4  4. Skin lesion of face Patient with complaint of a skin lesion on the outside corner of her right eye that is gradually enlarging over time and is starting to interfere with vision.  Patient will be referred to dermatology for further evaluation and treatment - Ambulatory referral to Dermatology  5. Need for immunization against influenza Patient was offered influenza immunization at today's visit which she agreed to have done.  Patient was given educational material regarding influenza immunization possible side effects and patient is aware to call or return to clinic if she has any questions or concerns - Flu Vaccine QUAD 36+ mos IM  An After Visit Summary was printed and given to the patient.  Return in about 4 months (around 08/22/2018).

## 2018-04-22 NOTE — Progress Notes (Signed)
Patient was made aware in office of no infection being noted in the urine.

## 2018-04-22 NOTE — Patient Instructions (Signed)
Uterine Fibroids Uterine fibroids are tissue masses (tumors). They are also called leiomyomas. They can develop inside of a woman's womb (uterus). They can grow very large. Fibroids are not cancerous (benign). Most fibroids do not require medical treatment. Follow these instructions at home:  Keep all follow-up visits as told by your doctor. This is important.  Take medicines only as told by your doctor. ? If you were prescribed a hormone treatment, take the hormone medicines exactly as told. ? Do not take aspirin. It can cause bleeding.  Ask your doctor about taking iron pills and increasing the amount of dark green, leafy vegetables in your diet. These actions can help to boost your blood iron levels.  Pay close attention to your period. Tell your doctor about any changes, such as: ? Increased blood flow. This may require you to use more pads or tampons than usual per month. ? A change in the number of days that your period lasts per month. ? A change in symptoms that come with your period, such as back pain or cramping in your belly area (abdomen). Contact a doctor if:  You have pain in your back or the area between your hip bones (pelvic area) that is not controlled by medicines.  You have pain in your abdomen that is not controlled with medicines.  You have an increase in bleeding between and during periods.  You soak tampons or pads in a half hour or less.  You feel lightheaded.  You feel extra tired.  You feel weak. Get help right away if:  You pass out (faint).  You have a sudden increase in pelvic pain. This information is not intended to replace advice given to you by your health care provider. Make sure you discuss any questions you have with your health care provider. Document Released: 09/22/2010 Document Revised: 04/20/2016 Document Reviewed: 02/16/2014 Elsevier Interactive Patient Education  2018 Elsevier Inc.  

## 2018-04-23 ENCOUNTER — Telehealth: Payer: Self-pay

## 2018-04-23 ENCOUNTER — Telehealth: Payer: Self-pay | Admitting: Nurse Practitioner

## 2018-04-23 LAB — CBC WITH DIFFERENTIAL/PLATELET
Basophils Absolute: 0 x10E3/uL (ref 0.0–0.2)
Basos: 0 %
EOS (ABSOLUTE): 0.2 x10E3/uL (ref 0.0–0.4)
Eos: 3 %
Hematocrit: 33.4 % — ABNORMAL LOW (ref 34.0–46.6)
Hemoglobin: 10.4 g/dL — ABNORMAL LOW (ref 11.1–15.9)
Immature Grans (Abs): 0 x10E3/uL (ref 0.0–0.1)
Immature Granulocytes: 0 %
Lymphocytes Absolute: 2.5 x10E3/uL (ref 0.7–3.1)
Lymphs: 43 %
MCH: 22.6 pg — ABNORMAL LOW (ref 26.6–33.0)
MCHC: 31.1 g/dL — ABNORMAL LOW (ref 31.5–35.7)
MCV: 73 fL — ABNORMAL LOW (ref 79–97)
Monocytes Absolute: 0.5 x10E3/uL (ref 0.1–0.9)
Monocytes: 9 %
Neutrophils Absolute: 2.5 x10E3/uL (ref 1.4–7.0)
Neutrophils: 45 %
Platelets: 325 x10E3/uL (ref 150–450)
RBC: 4.61 x10E6/uL (ref 3.77–5.28)
RDW: 16.3 % — ABNORMAL HIGH (ref 12.3–15.4)
WBC: 5.7 x10E3/uL (ref 3.4–10.8)

## 2018-04-23 NOTE — Telephone Encounter (Signed)
Pt called to receive lab results, verified DOB and received lab results left by nurse she had no further question she states she will continue use of her iron supplement at her pharmacy

## 2018-04-23 NOTE — Telephone Encounter (Signed)
-----   Message from Antony Blackbird, MD sent at 04/23/2018  9:18 AM EDT ----- Notify patient that her Hgb is still below normal at 10.4 so she needs to continue an iron supplement for treatment of her anemia (patient's native language is [place])

## 2018-04-23 NOTE — Telephone Encounter (Signed)
[facility] interpeter [place] id [phone], called the patient. The patient did not answer. Vimall left a vm on the patients phone to return the call to [facility] at her earliest convenience regarding her lab results. If the patient calls back please inform the patient that her Hemoglobin is still below normal at 10.4 so she needs to continue an iron supplement for treatment of her anemia (patient's native language is [place]).

## 2018-04-24 ENCOUNTER — Encounter: Payer: Self-pay | Admitting: Obstetrics & Gynecology

## 2018-05-19 ENCOUNTER — Ambulatory Visit: Payer: Medicaid Other | Admitting: Nurse Practitioner

## 2018-05-28 ENCOUNTER — Ambulatory Visit: Payer: Medicaid Other | Admitting: Obstetrics & Gynecology

## 2018-05-28 MED FILL — FERROUS SULFATE 325 MG TAB: 325 (65 FE) | 30 days supply | Qty: 90 | Fill #1

## 2018-06-17 ENCOUNTER — Encounter: Payer: Self-pay | Admitting: Obstetrics & Gynecology

## 2018-06-17 ENCOUNTER — Encounter: Payer: Self-pay | Admitting: *Deleted

## 2018-06-17 ENCOUNTER — Ambulatory Visit (INDEPENDENT_AMBULATORY_CARE_PROVIDER_SITE_OTHER): Payer: PRIVATE HEALTH INSURANCE | Admitting: Obstetrics & Gynecology

## 2018-06-17 VITALS — BP 128/85 | HR 83 | Wt 213.4 lb

## 2018-06-17 DIAGNOSIS — Z30430 Encounter for insertion of intrauterine contraceptive device: Secondary | ICD-10-CM | POA: Diagnosis not present

## 2018-06-17 DIAGNOSIS — Z3040 Encounter for surveillance of contraceptives, unspecified: Secondary | ICD-10-CM

## 2018-06-17 DIAGNOSIS — Z1231 Encounter for screening mammogram for malignant neoplasm of breast: Secondary | ICD-10-CM

## 2018-06-17 LAB — POCT PREGNANCY, URINE: Preg Test, Ur: NEGATIVE

## 2018-06-17 MED ORDER — LEVONORGESTREL 19.5 MCG/DAY IU IUD
INTRAUTERINE_SYSTEM | Freq: Once | INTRAUTERINE | Status: AC
Start: 2018-06-17 — End: 2018-06-17
  Administered 2018-06-17: 11:00:00 via INTRAUTERINE

## 2018-06-17 NOTE — Progress Notes (Signed)
History:  47 y.o. [id number] here today for eval of irreg menses. In Feb pt reports a SAB. Since that time,pt reports pain with menses and heavy bleeding.Pt was on OCPs for 3 months with no change. Last pap smear was on 11/25/2014 and was normal.  The following portions of the patient's history were reviewed and updated as appropriate: allergies, current medications, past family history, past medical history, past social history, past surgical history and problem list.  Review of Systems:  Pertinent items are noted in HPI.    Objective:  Physical Exam Blood pressure 128/85, pulse 83, weight 213 lb 6.4 oz (96.8 kg), last menstrual period 05/31/2018.  CONSTITUTIONAL: Well-developed, well-nourished female in no acute distress.  HENT:  Normocephalic, atraumatic EYES: Conjunctivae and EOM are normal. No scleral icterus.  NECK: Normal range of motion SKIN: Skin is warm and dry. No rash noted. Not diaphoretic.No pallor. [place]: Alert and oriented to person, place, and time. Normal coordination.  Pelvic: Normal appearing external genitalia; normal appearing vaginal mucosa and cervix.  Normal discharge.  Small uterus, no other palpable masses, no uterine or adnexal tenderness  GYNECOLOGY CLINIC PROCEDURE NOTE   IUD Insertion Procedure Note Patient identified, informed consent performed.  Discussed risks of irregular bleeding, cramping, infection, malpositioning or misplacement of the IUD outside the uterus which may require further procedures. Time out was performed.  Urine pregnancy test negative.  Speculum placed in the vagina.  Cervix visualized.  Cleaned with Betadine x 2.  Grasped anteriorly with a single tooth tenaculum.  Uterus sounded to 9 cm.  Mirena IUD placed per manufacturer's recommendations.  Strings trimmed to 3 cm. Tenaculum was removed, good hemostasis noted.  Patient tolerated procedure well.    Assessment & Plan:  Heavy menses noted after SAB.  No intramenstrual bleeding F/u  PAP with hrHPV testing Patient was given post-procedure instructions.  Patient was asked to follow up in 4 weeks for IUD check.  Katheen Aslin L. Harraway-Smith, M.D., Cherlynn June

## 2018-06-17 NOTE — Progress Notes (Signed)
[place] Interpreter # [phone]

## 2018-06-17 NOTE — Patient Instructions (Signed)
Insertion de dispositif intra-utrin, soin aprs Cette fiche vous explique comment prendre soin de vous aprs votre intervention. Votre fournisseur de soins de sant peut galement vous donner des instructions plus prcises. Si vous avez des problmes ou des questions, Nurse, learning disability votre fournisseur de soins de sant.  quoi puis-je m'attendre aprs la procdure? Aprs la procdure, il est courant d'avoir: ? Crampes et douleurs abdominales. ? Des saignements lgers (saignotements) ou des saignements plus abondants qui ressemblent  vos rgles. Cela peut durer quelques jours. ? Douleur Consulting civil engineer. ? Vertiges. ? Maux de tte. ? La nause.  Suivez ces instructions  la maison: ? Avant de reprendre [employer], assurez-vous que vous sentez bien les fils du strilet. Vous devriez pouvoir sentir l'extrmit de la ou des cordes sous l'ouverture du col de votre utrus. Si votre chane de strilet est en place, vous pouvez reprendre une activit sexuelle. o Si vous avez eu un DIU hormonal insr plus de 7 jours aprs le dbut de vos dernires rgles, vous devrez utiliser une mthode de contraception de secours pendant 7 jours aprs la pose du DIU. Demandez  votre fournisseur de soins de sant si cela s'applique  vous. ? Continuez  vrifier que le DIU est toujours en place en recherchant la ou les ficelles aprs chaque menstruation ou une fois par [employer]. ? Ne prenez des [employer] en vente libre et sur ordonnance que selon les indications de votre fournisseur de soins de sant. ? Ne conduisez pas et n'utilisez pas de machinerie lourde pendant que vous prenez des [employer]. ? Gardez toutes les visites de [employer] indiqu par votre fournisseur de soins de sant. C'est important. Contactez un fournisseur de soins de sant si: ? Vous avez des saignements plus abondants ou qui durent plus longtemps qu'un cycle menstruel normal. ? Tu as de TEFL teacher. ? Vous avez des  crampes ou des douleurs abdominales qui s'aggravent ou qui ne s'amliorent pas avec des mdicaments. ? Vous dveloppez [employer] ou qui ne se situe pas dans la mme zone de crampes et de douleurs antrieures. ? Vous vous sentez tourdi ou faible. ? Vous avez des [employer] ou malodorantes de votre vagin. ? Vous avez mal pendant l'activit sexuelle. ? Vous avez l'un des problmes suivants avec votre (vos) chane (s) de strilet: o La ficelle vous drange ou vous blesse, vous ou votre partenaire sexuel. o Vous ne pouvez pas sentir la ficelle. o La chane est devenue plus longue. ? Vous pouvez Marketing executive vagin. ? Vous pensez que vous pouvez tre enceinte ou que vous manquez vos rgles. ? Vous pensez que vous pouvez avoir une IST (infection sexuellement transmissible). Fae Pippin de l'aide immdiatement si: ? Vous avez des symptmes pseudo-grippaux. ? Vous avez de la fivre et des frissons. ? Vous pouvez sentir que votre strilet a gliss. Sommaire ? Aprs la procdure, il est courant d'avoir des crampes et des [employer]. Il est galement courant d'avoir des saignements lgers (spotting) ou des saignements plus abondants qui ressemblent  vos rgles. ? Continuez  vrifier que le DIU est toujours en place en recherchant la ou les ficelles aprs chaque menstruation ou une fois par [employer]. ? Gardez toutes les visites de [employer] indiqu par votre fournisseur de soins de sant. C'est important. ? Contactez votre fournisseur de soins de sant si vous avez des [employer] votre (vos) chane (s) du strilet, tels que la chane s'allonge ou vous gne ou  proccupe votre partenaire sexuel. Ces informations ne sont pas destines  remplacer les conseils donns par votre fournisseur de soins de sant. Assurez-vous de Counsellor de toutes vos questions avec votre fournisseur de soins de sant.

## 2018-06-26 ENCOUNTER — Ambulatory Visit: Payer: No Typology Code available for payment source | Attending: Nurse Practitioner | Admitting: Physician Assistant

## 2018-06-26 VITALS — BP 127/70 | HR 70 | Temp 99.4°F | Resp 18 | Ht 62.0 in | Wt 215.0 lb

## 2018-06-26 DIAGNOSIS — J029 Acute pharyngitis, unspecified: Secondary | ICD-10-CM | POA: Diagnosis not present

## 2018-06-26 LAB — POCT RAPID STREP A (OFFICE): Rapid Strep A Screen: NEGATIVE

## 2018-06-26 MED ORDER — AMOXICILLIN 500 MG PO CAPS: 500.0000 mg | ORAL_CAPSULE | Freq: Three times a day (TID) | ORAL | 0 refills | Status: DC

## 2018-06-26 MED ORDER — FLUCONAZOLE 150 MG PO TABS: 150.0000 mg | ORAL_TABLET | Freq: Once | ORAL | 0 refills | Status: AC

## 2018-06-26 MED FILL — FLUCONAZOLE 150 MG TABS: 150 | 1 days supply | Qty: 1 | Fill #0

## 2018-06-26 MED FILL — FERROUS SULFATE 325 MG TAB: 325 (65 FE) | 30 days supply | Qty: 90 | Fill #2

## 2018-06-26 MED FILL — AMOXICILLIN 500 MG CAPSULE: 500 | 10 days supply | Qty: 30 | Fill #0

## 2018-06-26 NOTE — Patient Instructions (Signed)

## 2018-06-26 NOTE — Progress Notes (Signed)
Patient ID: Karen Gibbs, female   DOB: 05/04/1971, 47 y.o.   MRN: [id number]   Tyianna Gibbs, is a 47 y.o. female  ZYS:[id number]  XNA:[id number]  DOB - 1971-03-10  Subjective:  Chief Complaint and HPI: Karen Gibbs is a 47 y.o. female here today for 2 day h/o ST and subjective fever/chills.  + congestion.  No cough.  H/o recurrent strep in 2016-2017.  None again since.  Taking tyelnol in the daytime and nyquil at night  ROS:   Constitutional:  +subjective f/c, No night sweats, No unexplained weight loss. EENT:  No vision changes, No blurry vision, No hearing changes. No mouth, throat, or ear problems.  Respiratory: No cough, No SOB Cardiac: No CP, no palpitations GI:  No abd pain, No N/V/D. GU: No Urinary s/sx Musculoskeletal: No joint pain Neuro: No headache, no dizziness, no motor weakness.  Skin: No rash Endocrine:  No polydipsia. No polyuria.  Psych: Denies SI/HI  No problems updated.  ALLERGIES: No Known Allergies  PAST MEDICAL HISTORY: Past Medical History:  Diagnosis Date  . Anemia   . Environmental allergies   . Fibroid   . Medical history non-contributory     MEDICATIONS AT HOME: Prior to Admission medications   Medication Sig Start Date End Date Taking? Authorizing Provider  ferrous sulfate 325 (65 FE) MG EC tablet Take 1 tablet (325 mg total) by mouth 3 (three) times daily with meals. 04/22/18 06/26/18 Yes Fulp, Cammie, MD  amoxicillin (AMOXIL) 500 MG capsule Take 1 capsule (500 mg total) by mouth 3 (three) times daily. 06/26/18   Argentina Donovan, PA-C  fluconazole (DIFLUCAN) 150 MG tablet Take 1 tablet (150 mg total) by mouth once for 1 dose. 06/26/18 06/26/18  Argentina Donovan, PA-C     Objective:  EXAM:   Vitals:   06/26/18 1139  BP: 127/70  Pulse: 70  Resp: 18  Temp: 99.4 F (37.4 C)  TempSrc: Oral  SpO2: 100%  Weight: 215 lb (97.5 kg)  Height: 5\' 2"  (1.575 m)    General appearance : A&OX3. NAD. Non-toxic-appearing HEENT:  Atraumatic and Normocephalic.  PERRLA. EOM intact.  TM full B. Mouth-MMM, post pharynx WNL w/ erythema but no exudate.  Tonsils are swollen, uvula is midline, no sign of abscess,  No PND. Neck: supple, no JVD. +shotty cervical lymphadenopathy. No thyromegaly Chest/Lungs:  Breathing-non-labored, Good air entry bilaterally, breath sounds normal without rales, rhonchi, or wheezing  CVS: S1 S2 regular, no murmurs, gallops, rubs  Extremities: Bilateral Lower Ext shows no edema, both legs are warm to touch with = pulse throughout Neurology:  CN II-XII grossly intact, Non focal.   Psych:  TP linear. J/I WNL. Normal speech. Appropriate eye contact and affect.  Skin:  No Rash  Data Review No results found for: HGBA1C   Assessment & Plan   1. Sore throat Will cover for strep due to history despite negative rapid test - POCT rapid strep A - amoxicillin (AMOXIL) 500 MG capsule; Take 1 capsule (500 mg total) by mouth 3 (three) times daily.  Dispense: 30 capsule; Refill: 0 - Culture, Group A Strep - fluconazole (DIFLUCAN) 150 MG tablet; Take 1 tablet (150 mg total) by mouth once for 1 dose.  Dispense: 1 tablet; Refill: 0  Fluids, rest, salt water gargles.     Patient have been counseled extensively about nutrition and exercise  Return if symptoms worsen or fail to improve.  The patient was given clear instructions to go to ER or return to  medical center if symptoms don't improve, worsen or new problems develop. The patient verbalized understanding. The patient was told to call to get lab results if they haven't heard anything in the next week.     Freeman Caldron, PA-C [facility] [place], [place]   06/26/2018, 11:56 AM

## 2018-06-29 LAB — CULTURE, GROUP A STREP: Strep A Culture: NEGATIVE

## 2018-07-07 ENCOUNTER — Telehealth: Payer: Self-pay | Admitting: *Deleted

## 2018-07-07 NOTE — Telephone Encounter (Signed)
-----   Message from Argentina Donovan, [place] sent at 07/01/2018  5:08 PM EDT ----- You do not need to finish your antibiotics.  Your strep throat culture was negative, so, it was a virus causing your sore throat.  Follow-up as planned.  Thanks, Freeman Caldron, PA-C

## 2018-07-07 NOTE — Telephone Encounter (Signed)
Medical Assistant used [place] [employer] to contact patient.  Interpreter Name: [place] Interpreter #:  [phone] Patient was not available, [facility] [employer] left patient a voicemail. Please inform patient of strep throat being negative. A VIRUS is causing the sore throat and she just needs to rest and DO not finish the antibiotics if she has not. Patient verified DOB Patient is aware!!

## 2018-07-15 ENCOUNTER — Encounter: Payer: Self-pay | Admitting: Obstetrics & Gynecology

## 2018-07-15 ENCOUNTER — Ambulatory Visit (INDEPENDENT_AMBULATORY_CARE_PROVIDER_SITE_OTHER): Payer: PRIVATE HEALTH INSURANCE | Admitting: Obstetrics & Gynecology

## 2018-07-15 ENCOUNTER — Other Ambulatory Visit (HOSPITAL_COMMUNITY)
Admission: RE | Admit: 2018-07-15 | Discharge: 2018-07-15 | Disposition: A | Discharge status: Home / Self Care | Payer: Medicaid Other | Source: Ambulatory Visit | Attending: Obstetrics & Gynecology | Admitting: Obstetrics & Gynecology

## 2018-07-15 VITALS — BP 130/81 | HR 78 | Wt 215.2 lb

## 2018-07-15 DIAGNOSIS — Z01419 Encounter for gynecological examination (general) (routine) without abnormal findings: Secondary | ICD-10-CM | POA: Insufficient documentation

## 2018-07-15 DIAGNOSIS — Z0000 Encounter for general adult medical examination without abnormal findings: Secondary | ICD-10-CM | POA: Diagnosis not present

## 2018-07-15 DIAGNOSIS — Z30431 Encounter for routine checking of intrauterine contraceptive device: Secondary | ICD-10-CM

## 2018-07-15 DIAGNOSIS — N939 Abnormal uterine and vaginal bleeding, unspecified: Secondary | ICD-10-CM

## 2018-07-15 DIAGNOSIS — D500 Iron deficiency anemia secondary to blood loss (chronic): Secondary | ICD-10-CM

## 2018-07-15 MED ORDER — MEGESTROL ACETATE 40 MG PO TABS: 40.0000 mg | ORAL_TABLET | Freq: Two times a day (BID) | ORAL | 5 refills | Status: DC

## 2018-07-15 MED FILL — MEGESTROL 40 MG TABLET: 40 | 15 days supply | Qty: 60 | Fill #0

## 2018-07-15 NOTE — Progress Notes (Signed)
Subjective:     Karen Gibbs is a 47 y.o. female here for a routine exam. [id number] Current complaints: Pt reports continued bleeding after IUD placed. Pt expresses frustration. 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  She reports that both she and her husband can feel the IUD.  She reports passage of clots. After speaking to the pt in detail, she reports that the bleeding is becoming less than prev.     Gynecologic History No LMP recorded. Contraception: IUD Last Pap: 11/24/2017. Results were: normal Last mammogram: 04/11/2017. Results were: Birad II favor benign  Obstetric History OB History  Gravida Para Term Preterm AB Living  5 4 4  0 1 4  SAB TAB Ectopic Multiple Live Births  1 0 0 0      # Outcome Date GA Lbr Len/2nd Weight Sex Delivery Anes PTL Lv  5 SAB           4 Term           3 Term           2 Term           1 Term            The following portions of the patient's history were reviewed and updated as appropriate: allergies, current medications, past family history, past medical history, past social history, past surgical history and problem list.  Review of Systems Pertinent items are noted in HPI.    Objective:  BP 130/81   Pulse 78   Wt 215 lb 3.2 oz (97.6 kg)   LMP 06/26/2018 (Exact Date)   BMI 39.36 kg/m   General Appearance:    Alert, cooperative, no distress, appears stated age  Head:    Normocephalic, without obvious abnormality, atraumatic  Eyes:    conjunctiva/corneas clear, EOM's intact, both eyes  Ears:    Normal external ear canals, both ears  Nose:   Nares normal, septum midline, mucosa normal, no drainage    or sinus tenderness  Throat:   Lips, mucosa, and tongue normal; teeth and gums normal  Neck:   Supple, symmetrical, trachea midline, no adenopathy;    thyroid:  no enlargement/tenderness/nodules  Back:     Symmetric, no curvature, ROM normal, no CVA tenderness  Lungs:     Clear to auscultation bilaterally,  respirations unlabored  Chest Wall:    No tenderness or deformity   Heart:    Regular rate and rhythm, S1 and S2 normal, no murmur, rub   or gallop  Breast Exam:    No tenderness, masses, or nipple abnormality  Abdomen:     Soft, non-tender, bowel sounds active all four quadrants,    no masses, no organomegaly  Genitalia:    Normal female without lesion, discharge or tenderness: IUD strings noted and short.  Lots of blood in vault.      Extremities:   Extremities normal, atraumatic, no cyanosis or edema  Pulses:   2+ and symmetric all extremities  Skin:   Skin color, texture, turgor normal, no rashes or lesions      Assessment:    Healthy female exam.   AUB -has IUD to control the bleeding.  Management of menses- education    Plan:    Follow up in: 1 year.    F/u PAP and Cx rec that pt wear Tampons.  Megace rx for 40mg  bid written so that if the bleeding becomes unbearable pt can begin it  Labs: TSH and CBC F/u in 4 weeks  Zaydyn Havey L. Ihor Dow, M.D., Cherlynn June     Id [phone]

## 2018-07-15 NOTE — Patient Instructions (Signed)
Un dispositif intra-utrin (DIU) est un dispositif mdical qui est insr dans l'utrus pour prvenir [employer]. Il s'agit d'un petit appareil en forme de T auquel sont suspendues une ou deux ficelles en nylon. Les cordes pendent de la partie infrieure de l'utrus (col de l'utrus) pour [employer] retrait [employer]. Deux types de DIU sont disponibles:   DIU au cuivre. Ce type de DIU est entour de fil de cuivre. Le cuivre fait que l'utrus et les trompes de [place] produisent un liquide qui tue les spermatozodes. Un DIU en cuivre peut durer jusqu' 10 ans.  Strilet hormonal. Ce type de DIU est en plastique et contient l'hormone progestative (progestrone synthtique). L'hormone paissit le mucus dans le col utrin et empche les spermatozodes d'entrer dans l'utrus. Il amincit galement la muqueuse utrine pour [employer] l'implantation d'un ovule fcond. L'hormone peut affaiblir ou tuer les spermatozodes qui pntrent dans l'utrus. Un DIU aux hormones peut durer de 3  5 ans.  Parlez  un fournisseur de soins de sant de:  Toutes les allergies que vous avez.  Tous les mdicaments que vous prenez, y compris les vitamines, les herbes, les gouttes pour les yeux, les crmes et les mdicaments en [employer].  Tout problme que vous ou des membres de votre famille avez eu avec des mdicaments anesthsiques.  Vous avez des problmes de sang.  Toutes les chirurgies que vous avez subies.  Tous vos problmes de sant, y compris vos IST (infections transmissibles sexuellement).  Que vous soyez enceinte ou susceptible de l'tre. Quels sont les risques? En rgle gnrale, il s'agit d'une procdure sre. Cependant, des [employer], notamment:  Infection. Saignement.  Ractions allergiques aux mdicaments.  Une ponction accidentelle (perforation) de l'utrus ou des [employer] structures ou organes.  Placement accidentel du DIU soit dans la couche  musculaire de l'utrus (myomtre), soit  l'extrieur de l'utrus.  Le DIU tombe de l'utrus (expulsion). Ceci est plus frquent [employer] Inc femmes qui ont rcemment eu un enfant.  Rice (grossesse extra-utrine).  Infection de l'utrus et des trompes de [place] (maladie inflammatoire pelvienne).  Qu'est-ce qui se passe avant la procdure?  Planifiez l'insertion du DIU pour [employer] moment o vous aurez vos rgles ou juste aprs, afin de vous assurer que vous n'tes pas enceinte. La mise en place du DIU est mieux tolre peu de temps aprs un cycle menstruel.  Suivez les instructions de votre fournisseur de soins de sant concernant les restrictions pour [employer] boire.  Demandez  votre fournisseur de soins de sant de Chartered loss adjuster vos mdicaments habituels. Ceci est particulirement important si vous prenez des [employer] diabte ou des anticoagulants.  Vous pourrez peut-tre prendre un analgsique avant la procdure.  Vous pouvez avoir des tests pour: ? Stage manager. Un test de grossesse implique la prise d'un chantillon d'urine. ? IST. Placer un DIU chez une personne atteinte d'une IST peut Tax inspector. ? cancer du col utrin. Vous pouvez avoir un test Pap pour vrifier ce type de cancer. Cela signifie que la collecte de cellules de votre col utrin doit tre examine au microscope.  Vous pouvez subir un examen physique pour dterminer la taille et la position de votre utrus. La procdure peut varier selon les fournisseurs de soins de sant et les hpitaux. Que se passe-t-il pendant la procdure?  Un outil (spculum) sera plac dans votre vagin et largi afin que votre fournisseur de soins de sant puisse voir votre col utrin.  Des mdicaments peuvent tre appliqus sur votre col pour rduire votre risque d'infection (antiseptique).  Vous pouvez recevoir un mdicament anesthsique pour engourdir chaque ct du col de votre utrus  (bloc intracervical ou bloc paracervical). Ce mdicament est gnralement administr par injection dans le col utrin.  Un outil (son utrin) sera insr dans votre utrus pour Banker de votre utrus et la direction dans laquelle votre utrus pourrait tre inclin.  Un instrument mince ou un tube (dispositif d'insertion de DIU) contenant le DIU sera insr dans votre vagin,  travers votre canal cervical et [employer] utrus.  Le strilet sera plac dans l'utrus et le dispositif d'insertion du strilet sera retir.  Les ficelles attaches au strilet seront coupes de manire  se trouver juste en dessous du col de l'utrus. La procdure peut varier selon les fournisseurs de soins de sant et les hpitaux. Qu'est-ce qui se passe aprs la procdure?  Vous pouvez avoir des [employer] procdure. C'est normal. Il varie de saignements lgers (spotting) pendant quelques jours  des saignements de type menstruel.  Vous pouvez avoir des [employer].  Vous pourriez vous sentir tourdi ou avoir la tte qui tourne.  Vous pouvez avoir des [employer]. [place]  Un dispositif intra-utrin (DIU) est un petit dispositif en forme de T auquel sont suspendues une ou deux chanes de nylon.  Deux types de DIU sont disponibles. Vous pouvez avoir un strilet au cuivre ou un strilet aux hormones.  Planifiez l'insertion du DIU pour [employer] moment o vous aurez vos rgles ou juste aprs, afin de vous assurer que vous n'tes pas enceinte. La mise en place du DIU est mieux tolre peu de temps aprs un cycle menstruel.  Vous pouvez avoir des [employer] procdure. C'est normal. Cela varie de la prsence de taches lgres pendant quelques jours  des [employer]. Ces informations ne sont pas destines  remplacer les conseils donns par votre fournisseur de soins de sant. Assurez-vous de Counsellor de toutes vos questions avec votre fournisseur de  soins de sant.  -----  Cette fiche vous explique comment prendre soin de vous aprs votre intervention. Votre fournisseur de soins de sant peut galement vous donner des instructions plus prcises. Si vous avez des problmes ou des questions, Nurse, learning disability votre fournisseur de soins de sant.  quoi puis-je m'attendre aprs la procdure? Aprs la procdure, il est courant d'avoir:  Crampes et douleurs abdominales. Des saignements lgers (saignotements) ou des saignements plus abondants qui ressemblent  vos rgles. Cela peut durer quelques jours.  Douleur Consulting civil engineer.  Vertiges.  Maux de tte.  La nause.  Suivez ces instructions  la maison:  Avant de reprendre une activit sexuelle, assurez-vous que vous sentez bien les fils du strilet. Vous devriez pouvoir sentir l'extrmit de la ou des cordes sous l'ouverture du col de votre utrus. Si votre chane de strilet est en place, vous pouvez reprendre une activit sexuelle. ? Si vous avez eu un DIU hormonal insr plus de 7 jours aprs le dbut de votre dernire priode, vous devrez utiliser une mthode de contraception de secours pendant 7 jours aprs la pose du DIU. Demandez  votre fournisseur de soins de sant si cela s'applique  vous.  Continuez  vrifier que le DIU est toujours en place en recherchant la ou les ficelles aprs chaque menstruation ou une fois par [employer] [facility].  Ne prenez que des [employer] en vente libre et sur [place], selon l'avis de votre  fournisseur de soins de sant.  Ne conduisez pas et n'utilisez pas de machinerie lourde pendant que vous prenez des [employer].  Conservez toutes les visites de suivi comme indiqu par votre fournisseur de soins de sant. C'est important. Contactez un fournisseur de soins de sant si:  Vous avez des saignements plus abondants ou qui durent plus longtemps qu'un cycle menstruel normal.  Tu as de la fivre.  Vous avez des crampes ou des douleurs  abdominales qui s'aggravent ou qui ne s'amliorent pas avec des mdicaments.  Vous dveloppez [employer] abdominale nouvelle ou qui ne se situe pas dans la mme zone de crampes et de douleurs antrieures.  Vous vous sentez tourdi ou faible.  Vous avez un coulement anormal ou malodorant de votre vagin.  Vous avez des Designer, industrial/product.  Vous avez l'un des problmes suivants avec votre (vos) chane (s) de strilet: ? La corde vous drange ou vous blesse, vous ou votre partenaire sexuel. ? Vous ne pouvez pas sentir la chane. ? La chane est devenue plus longue.  Vous pouvez Marketing executive vagin.  Vous pensez que vous pouvez tre enceinte ou que vous manquez vos rgles.  Vous pensez que vous pouvez avoir une IST (infection sexuellement transmissible). Fae Pippin de l'aide immdiatement si:  Vous avez des symptmes pseudo-grippaux.  Vous avez de la fivre et des frissons.  Vous pouvez sentir que votre strilet a gliss. [place]  Aprs la procdure, il est courant d'avoir des crampes et des [employer]. Il est galement courant d'avoir des saignements lgers (spotting) ou des saignements plus abondants qui ressemblent  vos rgles.  Continuez  vrifier que le DIU est toujours en place en recherchant la ou les ficelles aprs chaque menstruation ou une fois par [facility].  Conservez toutes les visites de suivi comme indiqu par votre fournisseur de soins de sant. C'est important.  Contactez votre fournisseur de soins de sant si vous avez des [employer] votre (vos) chane (s) du strilet, tels que la chane s'allonge ou vous gne ou proccupe votre partenaire sexuel. Ces informations ne sont pas destines  remplacer les conseils donns par votre fournisseur de soins de sant. Assurez-vous de Counsellor de toutes vos questions avec votre fournisseur de soins de sant. Document paru le 15/04/2011 Document mis  jour le 07/11/2016 Document revu  le 07/11/2016 Elsevier Interactive Education Patient  2017 [employer].

## 2018-07-16 LAB — CBC
Hematocrit: 37.7 % (ref 34.0–46.6)
Hemoglobin: 11.7 g/dL (ref 11.1–15.9)
MCH: 23.4 pg — ABNORMAL LOW (ref 26.6–33.0)
MCHC: 31 g/dL — ABNORMAL LOW (ref 31.5–35.7)
MCV: 76 fL — ABNORMAL LOW (ref 79–97)
Platelets: 293 10*3/uL (ref 150–450)
RBC: 4.99 x10E6/uL (ref 3.77–5.28)
RDW: 18.4 % — ABNORMAL HIGH (ref 12.3–15.4)
WBC: 7.1 10*3/uL (ref 3.4–10.8)

## 2018-07-16 LAB — TSH: TSH: 1.56 u[IU]/mL (ref 0.450–4.500)

## 2018-07-17 LAB — CYTOLOGY - PAP
Diagnosis: NEGATIVE
HPV: NOT DETECTED

## 2018-07-22 ENCOUNTER — Telehealth: Payer: Self-pay | Admitting: *Deleted

## 2018-07-22 NOTE — Telephone Encounter (Signed)
Pt's last hemoglobin is 11.7 which is low normal.  She can supplement with OTC iron twice a day with colace to prevent constipation.  It sounds like she needs an appt this week for follow up to discuss next steps.  Please schedule her with someone this week.

## 2018-07-22 NOTE — Telephone Encounter (Addendum)
Called pt to see if she is continuing to have bleeding and to inform her that she is anemic and will need an iron transfusion. Interpreter [phone].  Pt reports she is still bleeding and it has actually increased.  Pt reports she took Megace BID for 4 days but stopped because she felt it was making her too tired and decreasing her ability to work.  Pt also reports that she felt it was making her dizzy when she woke.  Pt reports she has not taken Megace for 1 week and that her bleeding never changed while on the medication. Pt reports an increase in bleeding beginning Sunday 07/20/18.  Pt verbalized understanding of anemia and the need for iron infusions. Will forward to Dr. Ihor Dow to advise.

## 2018-07-23 ENCOUNTER — Telehealth (HOSPITAL_COMMUNITY): Payer: Self-pay | Admitting: Obstetrics and Gynecology

## 2018-07-23 ENCOUNTER — Telehealth (HOSPITAL_COMMUNITY): Payer: Self-pay | Admitting: *Deleted

## 2018-07-23 NOTE — Telephone Encounter (Signed)
Left message regarding scheduling screening mammogram.

## 2018-07-23 NOTE — Telephone Encounter (Signed)
Interpreter [phone] used to inform pt of Dr. Roseanne Kaufman recommendation that the pt not receive an iron infusion, but instead take OTC iron and colace to prevent constipation.  Advised pt that someone from the front office would be calling her to schedule an appointment to discuss further management of her bleeding. Pt verbalized understanding.  Pt will schedule an appointment with front office staff.

## 2018-07-23 NOTE — Telephone Encounter (Signed)
Patient called today stating she needs to schedule a mammogram. Patient has health insurance. Explained to patient that she is not eligible for the B[facility]CP program since she doesn't have health insurance. Told patient [facility]. Patient verbalized understanding.

## 2018-07-24 ENCOUNTER — Ambulatory Visit
Admission: RE | Admit: 2018-07-24 | Discharge: 2018-07-24 | Disposition: A | Discharge status: Home / Self Care | Payer: PRIVATE HEALTH INSURANCE | Source: Ambulatory Visit | Attending: Obstetrics & Gynecology | Admitting: Obstetrics & Gynecology

## 2018-07-24 DIAGNOSIS — Z1231 Encounter for screening mammogram for malignant neoplasm of breast: Secondary | ICD-10-CM

## 2018-07-25 ENCOUNTER — Ambulatory Visit (INDEPENDENT_AMBULATORY_CARE_PROVIDER_SITE_OTHER): Payer: PRIVATE HEALTH INSURANCE | Admitting: Obstetrics and Gynecology

## 2018-07-25 ENCOUNTER — Encounter: Payer: Self-pay | Admitting: Obstetrics and Gynecology

## 2018-07-25 VITALS — BP 122/80 | HR 73 | Wt 214.1 lb

## 2018-07-25 DIAGNOSIS — N939 Abnormal uterine and vaginal bleeding, unspecified: Secondary | ICD-10-CM | POA: Insufficient documentation

## 2018-07-25 DIAGNOSIS — Z789 Other specified health status: Secondary | ICD-10-CM

## 2018-07-25 DIAGNOSIS — T8332XA Displacement of intrauterine contraceptive device, initial encounter: Secondary | ICD-10-CM | POA: Insufficient documentation

## 2018-07-25 DIAGNOSIS — N920 Excessive and frequent menstruation with regular cycle: Secondary | ICD-10-CM | POA: Diagnosis not present

## 2018-07-25 DIAGNOSIS — Z758 Other problems related to medical facilities and other health care: Secondary | ICD-10-CM | POA: Insufficient documentation

## 2018-07-25 MED ORDER — TRANEXAMIC ACID 650 MG PO TABS: 1300.0000 mg | ORAL_TABLET | Freq: Three times a day (TID) | ORAL | 1 refills | Status: AC

## 2018-07-25 NOTE — Progress Notes (Signed)
Ultrasound scheduled for 07/29/18 @ 1600.  Pt instructed to arrive 15 minutes early with a full bladder.

## 2018-07-25 NOTE — Progress Notes (Signed)
Obstetrics and Gynecology Visit Return Patient Evaluation  Appointment Date: 07/25/2018  Primary Care Provider: [place] [place] [facility]  Chief Complaint: continued AUB  History of Present Illness:  Marg Macmaster is a 47 y.o. with the above CC. Patient of Dr. Ihor Dow and last seen by her on 07/15/18. At that visit she had continued aub and was put on megace 40 bid and a tsh and cbc were written for her; see interval telephone notes. Patient states she's had AUB this entire time even with the mirena in place which was placed on 06/17/18.  She states she is using three pads per day (non saturating), no pain and no s/s of anemia. Patient would like her IUD out. She confirms she is taking PO iron . She only took the megace for a few days b/c it made her feel odd.   Review of Systems:  as noted in the History of Present Illness.  Medications:  Dawnita Herbster had no medications administered during this visit. Current Outpatient Medications  Medication Sig Dispense Refill  . IRON PO Take by mouth.    . ferrous sulfate 325 (65 FE) MG EC tablet Take 1 tablet (325 mg total) by mouth 3 (three) times daily with meals. 90 tablet 4  . megestrol (MEGACE) 40 MG tablet Take 1 tablet (40 mg total) by mouth 2 (two) times daily. Can increase to two tablets twice a day in the event of heavy bleeding (Patient not taking: Reported on 07/25/2018) 60 tablet 5   No current facility-administered medications for this visit.     Allergies: has No Known Allergies.  Physical Exam:  BP 122/80   Pulse 73   Wt 214 lb 1.6 oz (97.1 kg)   LMP 06/26/2018 (Exact Date)   BMI 39.16 kg/m  Body mass index is 39.16 kg/m. General appearance: Well nourished, well developed female in no acute distress.  Abdomen: diffusely non tender to palpation, non distended, and no masses, hernias Neuro/Psych:  Normal mood and affect.    Pelvic exam:  EGBUS normal Vaginal vault:  approx 72mL old blood in vault, no active bleeding Cervix: normal, no active bleeding. IUD strings not seen.    Assessment: pt stable.   Plan:  1. Intrauterine contraceptive device threads lost, initial encounter Will get pelvic u/s. IUD strings noted to be short at last visit. I told her also could have come out with the bleeding and clots.  - US PELVIC COMPLETE WITH TRANSVAGINAL; Future  3. Abnormal uterine bleeding (AUB) Will recheck levels today. She doesn't smoke and no h/o VTE or risk factors. I d/w r/b with Lysteda and she is amenable to this. ER precautions given. Pt told not to take the megace with it - CBC - Ferritin - US PELVIC COMPLETE WITH TRANSVAGINAL; Future  In person interpreter used  RTC: 12/2  Durene Romans MD Attending Center for [facility])

## 2018-07-26 LAB — CBC
Hematocrit: 39.3 % (ref 34.0–46.6)
Hemoglobin: 12.1 g/dL (ref 11.1–15.9)
MCH: 23 pg — ABNORMAL LOW (ref 26.6–33.0)
MCHC: 30.8 g/dL — ABNORMAL LOW (ref 31.5–35.7)
MCV: 75 fL — ABNORMAL LOW (ref 79–97)
Platelets: 242 10*3/uL (ref 150–450)
RBC: 5.25 x10E6/uL (ref 3.77–5.28)
RDW: 18.8 % — ABNORMAL HIGH (ref 12.3–15.4)
WBC: 5.1 10*3/uL (ref 3.4–10.8)

## 2018-07-26 LAB — FERRITIN: Ferritin: 20 ng/mL (ref 15–150)

## 2018-07-29 ENCOUNTER — Ambulatory Visit (HOSPITAL_COMMUNITY)
Admission: RE | Admit: 2018-07-29 | Discharge: 2018-07-29 | Disposition: A | Discharge status: Home / Self Care | Payer: PRIVATE HEALTH INSURANCE | Source: Ambulatory Visit | Attending: Obstetrics and Gynecology | Admitting: Obstetrics and Gynecology

## 2018-07-29 DIAGNOSIS — T8332XA Displacement of intrauterine contraceptive device, initial encounter: Secondary | ICD-10-CM

## 2018-07-29 DIAGNOSIS — N939 Abnormal uterine and vaginal bleeding, unspecified: Secondary | ICD-10-CM

## 2018-07-29 DIAGNOSIS — Z975 Presence of (intrauterine) contraceptive device: Secondary | ICD-10-CM | POA: Insufficient documentation

## 2018-07-29 DIAGNOSIS — N83292 Other ovarian cyst, left side: Secondary | ICD-10-CM | POA: Insufficient documentation

## 2018-08-04 ENCOUNTER — Ambulatory Visit (INDEPENDENT_AMBULATORY_CARE_PROVIDER_SITE_OTHER): Payer: PRIVATE HEALTH INSURANCE | Admitting: Obstetrics & Gynecology

## 2018-08-04 ENCOUNTER — Encounter: Payer: Self-pay | Admitting: Obstetrics & Gynecology

## 2018-08-04 VITALS — BP 153/58 | HR 79 | Wt 216.2 lb

## 2018-08-04 DIAGNOSIS — N939 Abnormal uterine and vaginal bleeding, unspecified: Secondary | ICD-10-CM | POA: Diagnosis not present

## 2018-08-04 MED ORDER — NORETHINDRONE ACETATE 5 MG PO TABS: 5.0000 mg | ORAL_TABLET | Freq: Every day | ORAL | 2 refills | Status: DC

## 2018-08-04 NOTE — Progress Notes (Signed)
Video Interpreter # [phone]

## 2018-08-04 NOTE — Progress Notes (Signed)
History:  47 y.o. [id number] here today for   The following portions of the patient's history were reviewed and updated as appropriate: allergies, current medications, past family history, past medical history, past social history, past surgical history and problem list.  Review of Systems:  Pertinent items are noted in HPI.    Objective:  Physical Exam Blood pressure (!) 153/58, pulse 79, weight 216 lb 3.2 oz (98.1 kg).  CONSTITUTIONAL: Well-developed, well-nourished female in no acute distress.  HENT:  Normocephalic, atraumatic EYES: Conjunctivae and EOM are normal. No scleral icterus.  NECK: Normal range of motion SKIN: Skin is warm and dry. No rash noted. Not diaphoretic.No pallor. [place]: Alert and oriented to person, place, and time. Normal coordination.  Abd: Soft, nontender and nondistended Pelvic: Normal appearing external genitalia; normal appearing vaginal mucosa and cervix.  Normal discharge.  Small uterus, no other palpable masses, no uterine or adnexal tenderness  Labs and Imaging Mm Digital Screening  Result Date: 07/25/2018 CLINICAL DATA:  Screening. EXAM: DIGITAL SCREENING BILATERAL MAMMOGRAM WITH CAD COMPARISON:  Previous exam(s). ACR Breast Density Category b: There are scattered areas of fibroglandular density. FINDINGS: There are no findings suspicious for malignancy. Images were processed with CAD. IMPRESSION: No mammographic evidence of malignancy. A result letter of this screening mammogram will be mailed directly to the patient. RECOMMENDATION: Screening mammogram in one year. (Code:SM-B-01Y) BI-RADS CATEGORY  1: Negative. Electronically Signed   By: Ammie Ferrier M.D.   On: 07/25/2018 07:32   US Pelvic Complete With Transvaginal  Result Date: 07/29/2018 CLINICAL DATA:  Initial evaluation for IUD placement. Abnormal uterine bleeding. EXAM: TRANSABDOMINAL AND TRANSVAGINAL ULTRASOUND OF PELVIS TECHNIQUE: Both transabdominal and transvaginal ultrasound  examinations of the pelvis were performed. Transabdominal technique was performed for global imaging of the pelvis including uterus, ovaries, adnexal regions, and pelvic cul-de-sac. It was necessary to proceed with endovaginal exam following the transabdominal exam to visualize the uterus, endometrium, and ovaries. COMPARISON:  None FINDINGS: Uterus Measurements: 12.8 x 6.4 x 7.8 cm = volume: 332 mL. No fibroids or other mass visualized. Endometrium Thickness: 2 mm. No focal abnormality visualized. IUD in appropriate position within the endometrial canal at the level of the mid uterine body. Right ovary Measurements: 1.8 x 1.6 x 1.9 cm. Normal appearance/no adnexal mass. Left ovary Measurements: 5.3 x 4.1 x 5.0 cm = volume: 58.2 mL. 4.0 cm simple anechoic cyst, most consistent with a normal physiologic cyst. No internal vascularity or significant complexity. Other findings No abnormal free fluid. IMPRESSION: 1. IUD in appropriate position within the endometrial cavity. 2. Endometrial stripe measures 2 mm in maximal thickness. If bleeding remains unresponsive to hormonal or medical therapy, sonohysterogram should be considered for focal lesion work-up. (Ref: Radiological Reasoning: Algorithmic Workup of Abnormal Vaginal Bleeding with Endovaginal Sonography and Sonohysterography. AJR 2008; 683:M19-62) 3. 4 cm simple left ovarian cyst, most consistent with a normal physiologic follicular cyst. This has benign characteristics and is a common finding in premenopausal females. No imaging follow up is required. This follows consensus guidelines: Simple Adnexal Cysts: SRU Consensus Conference Update on Follow-up and Reporting. Radiology 2019; [id number]. Electronically Signed   By: Jeannine Boga M.D.   On: 07/29/2018 23:10    Assessment & Plan:  AUB- pt not able to tolerate Megace.  Pt was placed on Lysteda and has been taking it daily. .   Agyetin 5mg  1 po q day F/u in 6 weeks  Total face-to-face time with  patient was 25 min.  Greater than 50% was  spent in counseling and coordination of care with the patient.   Live interpreter used.   Karen Gibbs, M.D., Karen Gibbs

## 2018-08-05 ENCOUNTER — Ambulatory Visit (HOSPITAL_COMMUNITY): Payer: PRIVATE HEALTH INSURANCE

## 2018-08-05 MED FILL — NORETHINDRONE 5 MG TABLET: 5 | 30 days supply | Qty: 30 | Fill #0

## 2018-08-18 ENCOUNTER — Ambulatory Visit: Payer: Medicaid Other | Admitting: Family Medicine

## 2018-08-21 ENCOUNTER — Ambulatory Visit: Payer: Medicaid Other | Admitting: Obstetrics & Gynecology

## 2018-08-22 ENCOUNTER — Ambulatory Visit: Payer: PRIVATE HEALTH INSURANCE | Attending: Nurse Practitioner | Admitting: Family Medicine

## 2018-08-22 ENCOUNTER — Encounter: Payer: Self-pay | Admitting: Family Medicine

## 2018-08-22 VITALS — BP 121/86 | HR 79 | Temp 97.9°F | Wt 213.8 lb

## 2018-08-22 DIAGNOSIS — K30 Functional dyspepsia: Secondary | ICD-10-CM | POA: Insufficient documentation

## 2018-08-22 DIAGNOSIS — K219 Gastro-esophageal reflux disease without esophagitis: Secondary | ICD-10-CM | POA: Diagnosis not present

## 2018-08-22 DIAGNOSIS — N939 Abnormal uterine and vaginal bleeding, unspecified: Secondary | ICD-10-CM

## 2018-08-22 DIAGNOSIS — D500 Iron deficiency anemia secondary to blood loss (chronic): Secondary | ICD-10-CM | POA: Diagnosis not present

## 2018-08-22 DIAGNOSIS — R5383 Other fatigue: Secondary | ICD-10-CM | POA: Insufficient documentation

## 2018-08-22 DIAGNOSIS — K5900 Constipation, unspecified: Secondary | ICD-10-CM | POA: Insufficient documentation

## 2018-08-22 MED ORDER — OMEPRAZOLE 20 MG PO CPDR: 20.0000 mg | DELAYED_RELEASE_CAPSULE | Freq: Two times a day (BID) | ORAL | 4 refills | Status: DC

## 2018-08-22 MED ORDER — FERROUS SULFATE 325 (65 FE) MG PO TBEC: 325.0000 mg | DELAYED_RELEASE_TABLET | Freq: Three times a day (TID) | ORAL | 4 refills | Status: DC

## 2018-08-22 MED FILL — FERROUS SULFATE 325 MG TAB: 325 (65 FE) | 30 days supply | Qty: 90 | Fill #0

## 2018-08-22 MED FILL — OMEPRAZOLE 20 MG CAP: 20 | 30 days supply | Qty: 60 | Fill #0

## 2018-08-22 NOTE — Progress Notes (Signed)
Subjective:    Patient ID: Karen Gibbs, female    DOB: 1971/06/16, 47 y.o.   MRN: [id number]   Due to language barrier, Stratus video interpretation system was used at today's visit  HPI       47 year old female with a history of uterine fibroids, abnormal uterine bleeding and anemia who I last saw in the office on 04/22/2018 who returns for additional follow-up.  At her last visit, patient was referred to GYN secondary to her history of fibroids and complaint of heavy irregular menses with lower abdominal discomfort.  Patient had urinalysis done secondary to her complaint of lower abdominal discomfort, urinalysis did not show any indication of infection.  Patient also had CBC which showed continued anemia with a hemoglobin of 10.4 and low MCV at 73.  Patient was prescribed an iron supplement.      Patient reports that she is trying to take the iron supplement at least once per day.  Patient states that she did try to take the supplement 3 times daily which caused some stomach upset and constipation but this has resolved with once daily use.  Patient has also followed up with OB/GYN and did have insertion of an IUD for contraception as well as to help with her heavy menses.  Patient reports that she did have some continued bleeding initially after IUD placement and was given a prescription for medicine to take if needed to help decrease the bleeding.        Patient reports that in the past she was on medication for acid reflux which she has not been taking but patient now has had some burping/belching after certain foods as well as sensation of bad tasting liquid at the back of her throat/mouth similar to past reflux symptoms.  Patient would like to have a new prescription/refill of her prior omeprazole.  Patient denies any current abdominal pain.  No nausea/vomiting/diarrhea or constipation.  Patient has had no chest pain or palpitations.  No shortness of breath or cough.  Patient has mild fatigue.   Patient reports that she was never contacted by dermatology regarding the skin growth near the corner of her eye.  Past Medical History:  Diagnosis Date  . Anemia   . Environmental allergies   . Fibroid    Past Surgical History:  Procedure Laterality Date  . CESAREAN SECTION     C/S x 2   Social History   Tobacco Use  . Smoking status: Never Smoker  . Smokeless tobacco: Never Used  Substance Use Topics  . Alcohol use: No  . Drug use: No  No Known Allergies.   Review of Systems  Constitutional: Positive for fatigue. Negative for chills and fever.  HENT: Negative for sore throat and trouble swallowing.   Respiratory: Negative for cough and shortness of breath.   Cardiovascular: Negative for chest pain, palpitations and leg swelling.  Gastrointestinal: Negative for abdominal pain, constipation and diarrhea.  Endocrine: Negative for cold intolerance, heat intolerance, polydipsia, polyphagia and polyuria.  Genitourinary: Negative for dysuria, frequency and pelvic pain.  Musculoskeletal: Positive for back pain (occasional). Negative for arthralgias and gait problem.  Neurological: Negative for dizziness and headaches.       Objective:   Physical Exam BP 121/86   Pulse 79   Temp 97.9 F (36.6 C) (Oral)   Wt 213 lb 12.8 oz (97 kg)   SpO2 100%   BMI 39.10 kg/m Nurse's notes and vital signs reviewed General-well-nourished, well-developed overweight female in no  acute distress Neck-supple, no lymphadenopathy Lungs-clear to auscultation bilaterally Cardiovascular-regular rate and rhythm Abdomen- truncal obesity, abdomen is soft and nontender Back-no CVA tenderness Extremities-no edema        Assessment & Plan:  1. Abnormal uterine bleeding (AUB) Patient with history of abnormal uterine bleeding which is now being followed by OB/GYN.  Patient is status post placement of IUD and has had some decrease in frequency/heaviness of bleeding.  Patient will continue follow-up  with GYN.  2. Iron deficiency anemia due to chronic blood loss Patient with history of iron deficiency anemia and had continued anemia on CBC done at her last office visit with hemoglobin of 10.4.  Patient will continue ferrous sulfate at least once daily after a meal.  Patient does not wish to have repeat CBC at today's visit. - ferrous sulfate 325 (65 FE) MG EC tablet; Take 1 tablet (325 mg total) by mouth 3 (three) times daily with meals.  Dispense: 90 tablet; Refill: 4  3. Gastroesophageal reflux disease, esophagitis presence not specified Patient with complaint of some acid reflux type symptoms and patient is provided with refill of omeprazole 20 mg twice daily which she took in the past with good results.  Patient should also avoid foods which she knows tend to trigger her reflux symptoms as well as avoidance of late night eating.  Patient should call or return sooner if her reflux symptoms do not improve with the use of omeprazole or if she has worsening of symptoms. - omeprazole (PRILOSEC) 20 MG capsule; Take 1 capsule (20 mg total) by mouth 2 (two) times d daily aily. To reduce stomach acid (Patient not taking: Reported on 09/10/2018)  Dispense: 60 capsule; Refill: 4  An After Visit Summary was printed and given to the patient.  Return in about 3 months (around 11/21/2018).

## 2018-09-02 ENCOUNTER — Telehealth: Payer: Self-pay | Admitting: Obstetrics and Gynecology

## 2018-09-02 NOTE — Telephone Encounter (Signed)
Patient came into the office to move her appt up because she's in a lot of pain, she wanted to rsch with any Provider. She is also requesting pain medication.  [facility].

## 2018-09-08 ENCOUNTER — Telehealth: Payer: Self-pay | Admitting: Family Medicine

## 2018-09-08 NOTE — Telephone Encounter (Signed)
Called the patient and left Voicemail of flu restrictions.

## 2018-09-10 ENCOUNTER — Other Ambulatory Visit (HOSPITAL_COMMUNITY)
Admission: RE | Admit: 2018-09-10 | Discharge: 2018-09-10 | Disposition: A | Discharge status: Home / Self Care | Payer: PRIVATE HEALTH INSURANCE | Source: Ambulatory Visit | Attending: Obstetrics and Gynecology | Admitting: Obstetrics and Gynecology

## 2018-09-10 ENCOUNTER — Ambulatory Visit (INDEPENDENT_AMBULATORY_CARE_PROVIDER_SITE_OTHER): Payer: PRIVATE HEALTH INSURANCE | Admitting: Obstetrics and Gynecology

## 2018-09-10 ENCOUNTER — Encounter: Payer: Self-pay | Admitting: Obstetrics and Gynecology

## 2018-09-10 VITALS — BP 115/99 | HR 78 | Wt 213.3 lb

## 2018-09-10 DIAGNOSIS — N939 Abnormal uterine and vaginal bleeding, unspecified: Secondary | ICD-10-CM

## 2018-09-10 DIAGNOSIS — T8332XA Displacement of intrauterine contraceptive device, initial encounter: Secondary | ICD-10-CM

## 2018-09-10 LAB — POCT PREGNANCY, URINE: Preg Test, Ur: NEGATIVE

## 2018-09-10 MED ORDER — NORETHINDRONE ACETATE 5 MG PO TABS: ORAL_TABLET | ORAL | 2 refills | Status: DC

## 2018-09-10 NOTE — Progress Notes (Signed)
Obstetrics and Gynecology Visit Return Patient Evaluation  Appointment Date: 09/10/2018  Primary Care Provider: [place], [place] for [facility]  Chief Complaint: continued AUB, IUD fell out  History of Present Illness:  Karen Gibbs is a 48 y.o. with the above CC. Patient is seen by Dr. Ihor Dow but requested to be seen earlier. Patient states IUD fell out on 08/23/18 and that she's had continued bleeding with about 4-5 pads per day, no pain or s/s of anemia. Patient showed me a picture of the Herman IUD. Patient last seen by Dr. Ihor Dow on 12/2 for AUB and was switched to aygestin from megace due to couldn't tolerate it. Pt finished the aygestin about a week ago and no difference in bleeding.   Review of Systems:  as noted in the History of Present Illness.  Medications:  Karen Gibbs had no medications administered during this visit. Current Outpatient Medications  Medication Sig Dispense Refill  . ferrous sulfate 325 (65 FE) MG EC tablet Take 1 tablet (325 mg total) by mouth 3 (three) times daily with meals. 90 tablet 4  . omeprazole (PRILOSEC) 20 MG capsule Take 1 capsule (20 mg total) by mouth 2 (two) times daily. To reduce stomach acid (Patient not taking: Reported on 09/10/2018) 60 capsule 4   No current facility-administered medications for this visit.     Allergies: has No Known Allergies.  Physical Exam:  BP (!) 115/99   Pulse 78   Wt 213 lb 4.8 oz (96.8 kg)   BMI 39.01 kg/m  Body mass index is 39.01 kg/m. General appearance: Well nourished, well developed female in no acute distress.  Abdomen: diffusely non tender to palpation, non distended, and no masses, hernias Neuro/Psych:  Normal mood and affect.    Pelvic exam:  EGBUS, vaginal vault and cervix: within normal limits  See procedure note for endometrial biopsy  Labs:  UPT negative Assessment: pt stable  Plan:  1. Abnormal uterine bleeding (AUB) D/w her  with continued AUB despite LNG IUD and PO provera that I recommend endometrial biopsy today, due to risk of malignancy and hesitant to try anything else w/o sampling the endometrium, but procedure may not be covered by [facility] medicaid; she is amenable to embx which was done today. Prior pap in 2019 negative.   2. Contraception management Follow up embx results. D/w her that may want to try depo provera but embx needed first  3. GYN Patient to apply for coverage with [facility] Assistance program   Interpreter used.   RTC: next week for follow up and d/w her re: test results and next steps  Durene Romans MD Attending Center for [facility])

## 2018-09-11 NOTE — Telephone Encounter (Signed)
Pt seen 09/10/2018 by Dr. Ilda Basset  Concerns addressed at this time

## 2018-09-16 ENCOUNTER — Ambulatory Visit: Payer: PRIVATE HEALTH INSURANCE | Admitting: Obstetrics & Gynecology

## 2018-09-22 ENCOUNTER — Encounter: Payer: Self-pay | Admitting: Family Medicine

## 2018-09-22 ENCOUNTER — Ambulatory Visit: Payer: PRIVATE HEALTH INSURANCE | Admitting: Obstetrics & Gynecology

## 2018-09-24 MED FILL — FERROUS SULFATE 325 MG TAB: 325 (65 FE) | 30 days supply | Qty: 90 | Fill #1

## 2018-10-13 ENCOUNTER — Ambulatory Visit: Payer: No Typology Code available for payment source | Attending: Nurse Practitioner

## 2019-07-27 ENCOUNTER — Other Ambulatory Visit: Payer: Self-pay | Admitting: Obstetrics & Gynecology

## 2019-07-27 DIAGNOSIS — Z1231 Encounter for screening mammogram for malignant neoplasm of breast: Secondary | ICD-10-CM

## 2019-09-23 ENCOUNTER — Other Ambulatory Visit: Payer: Self-pay

## 2019-09-23 ENCOUNTER — Ambulatory Visit
Admission: RE | Admit: 2019-09-23 | Discharge: 2019-09-23 | Disposition: A | Discharge status: Home / Self Care | Payer: No Typology Code available for payment source | Source: Ambulatory Visit | Attending: Obstetrics & Gynecology | Admitting: Obstetrics & Gynecology

## 2019-09-23 DIAGNOSIS — Z1231 Encounter for screening mammogram for malignant neoplasm of breast: Secondary | ICD-10-CM

## 2019-12-30 ENCOUNTER — Other Ambulatory Visit: Payer: Self-pay

## 2019-12-30 ENCOUNTER — Ambulatory Visit: Payer: No Typology Code available for payment source | Attending: Family Medicine | Admitting: Physician Assistant

## 2019-12-30 VITALS — BP 135/84 | HR 81 | Temp 98.6°F | Resp 16 | Wt 219.8 lb

## 2019-12-30 DIAGNOSIS — D500 Iron deficiency anemia secondary to blood loss (chronic): Secondary | ICD-10-CM | POA: Diagnosis not present

## 2019-12-30 DIAGNOSIS — H029 Unspecified disorder of eyelid: Secondary | ICD-10-CM | POA: Diagnosis not present

## 2019-12-30 DIAGNOSIS — R5383 Other fatigue: Secondary | ICD-10-CM

## 2019-12-30 DIAGNOSIS — R630 Anorexia: Secondary | ICD-10-CM | POA: Diagnosis not present

## 2019-12-30 DIAGNOSIS — R739 Hyperglycemia, unspecified: Secondary | ICD-10-CM

## 2019-12-30 DIAGNOSIS — E559 Vitamin D deficiency, unspecified: Secondary | ICD-10-CM

## 2019-12-30 DIAGNOSIS — R17 Unspecified jaundice: Secondary | ICD-10-CM

## 2019-12-30 NOTE — Progress Notes (Signed)
Karen Gibbs, is a 49 y.o. female  [id number]  [id number]  DOB - 03-11-1971  Subjective:  Chief Complaint and HPI: Karen Gibbs is a 49 y.o. female here today with multiple issues.   Chronic anemia but she has been out of iron for a long time-over a year.  IUD came out a while ago.  Still has very heavy periods.  Feeling a lot more tired lately and c/o poor appetite.  No N/V/D.  Also says she feels like her eyes are yellow.  Denies abdominal pain or changes in stool.  No fever.  No Hepatitis B/C risks.    Also has small lesion to R of her eye that seems to be getting larger.  It is not affecting her vision   ROS:   Constitutional:  No f/c, No night sweats, No unexplained weight loss. EENT:  No vision changes, No blurry vision, No hearing changes. No mouth, throat, or ear problems.  Respiratory: No cough, No SOB Cardiac: No CP, no palpitations GI:  No abd pain, No N/V/D. GU: No Urinary s/sx Musculoskeletal: No joint pain Neuro: No headache, no dizziness, no motor weakness.  Skin: No rash Endocrine:  No polydipsia. No polyuria.  Psych: Denies SI/HI  No problems updated.  ALLERGIES: No Known Allergies  PAST MEDICAL HISTORY: Past Medical History:  Diagnosis Date  . Anemia   . Environmental allergies   . Fibroid     MEDICATIONS AT HOME: Prior to Admission medications   Medication Sig Start Date End Date Taking? Authorizing Provider  ferrous sulfate 325 (65 FE) MG EC tablet Take 1 tablet (325 mg total) by mouth 3 (three) times daily with meals. 08/22/18 09/21/18  Fulp, Cammie, MD  norethindrone (AYGESTIN) 5 MG tablet Take 1 tablet (5 mg total) by mouth daily. Patient not taking: Reported on 08/22/2018 08/04/18   Lavonia Drafts, MD  norethindrone (AYGESTIN) 5 MG tablet 10mg  po q8h 09/10/18   Aletha Halim, MD  omeprazole (PRILOSEC) 20 MG capsule Take 1 capsule (20 mg total) by mouth 2 (two) times daily. To reduce stomach acid Patient not taking:  Reported on 09/10/2018 08/22/18   Fulp, Ander Gaster, MD     Objective:  EXAM:   Vitals:   12/30/19 1605  BP: 135/84  Pulse: 81  Resp: 16  Temp: 98.6 F (37 C)  SpO2: 99%  Weight: 219 lb 12.8 oz (99.7 kg)    General appearance : A&OX3. NAD. Non-toxic-appearing HEENT: Atraumatic and Normocephalic.  PERRLA. EOM intact.  Just lateral to R lateral canthus, there is a small, well-demarcated nevi that is raised and slightly hyperpigmented. No scabbing or crusting.  Conjunctivae and sclera appear normal color to me w/o jaundaice Neck: supple, no JVD. No cervical lymphadenopathy. No thyromegaly Chest/Lungs:  Breathing-non-labored, Good air entry bilaterally, breath sounds normal without rales, rhonchi, or wheezing  CVS: S1 S2 regular, no murmurs, gallops, rubs  Extremities: Bilateral Lower Ext shows no edema, both legs are warm to touch with = pulse throughout Neurology:  CN II-XII grossly intact, Non focal.   Psych:  TP linear. J/I WNL. Normal speech. Appropriate eye contact and affect.  Skin:  No Rash  Data Review No results found for: HGBA1C   Assessment & Plan   1. Eyelid lesion - Ambulatory referral to Ophthalmology  2. Fatigue, unspecified type - CBC with Differential/Platelet - Iron, TIBC and Ferritin Panel - TSH  3. Iron deficiency anemia due to chronic blood loss - CBC with Differential/Platelet - Iron, TIBC and Ferritin Panel  4. Poor appetite - Comprehensive metabolic panel - CBC with Differential/Platelet - TSH  5. Vitamin D deficiency - Vitamin D, 25-hydroxy  6. Yellow eyes - Comprehensive metabolic panel - Ambulatory referral to Ophthalmology  7. Hyperglycemia Blood Culture I have had a lengthy discussion and provided education about insulin resistance and the intake of too much sugar/refined carbohydrates.  I have advised the patient to work at a goal of eliminating sugary drinks, candy, desserts, sweets, refined sugars, processed foods, and white  carbohydrates.  The patient expresses understanding.  - Hemoglobin A1c     Patient have been counseled extensively about nutrition and exercise  Return in about 6 months (around 06/30/2020) for PCP.  The patient was given clear instructions to go to ER or return to medical center if symptoms don't improve, worsen or new problems develop. The patient verbalized understanding. The patient was told to call to get lab results if they haven't heard anything in the next week.     Freeman Caldron, PA-C [facility] and [facility] [place], [place]   12/30/2019, 4:41 PM

## 2019-12-31 ENCOUNTER — Other Ambulatory Visit: Payer: Self-pay | Admitting: Physician Assistant

## 2019-12-31 DIAGNOSIS — D500 Iron deficiency anemia secondary to blood loss (chronic): Secondary | ICD-10-CM

## 2019-12-31 LAB — CBC WITH DIFFERENTIAL/PLATELET
Basophils Absolute: 0.1 10*3/uL (ref 0.0–0.2)
Basos: 1 %
EOS (ABSOLUTE): 0.1 10*3/uL (ref 0.0–0.4)
Eos: 2 %
Hematocrit: 29.5 % — ABNORMAL LOW (ref 34.0–46.6)
Hemoglobin: 8.2 g/dL — ABNORMAL LOW (ref 11.1–15.9)
Immature Grans (Abs): 0 10*3/uL (ref 0.0–0.1)
Immature Granulocytes: 0 %
Lymphocytes Absolute: 3.3 10*3/uL — ABNORMAL HIGH (ref 0.7–3.1)
Lymphs: 44 %
MCH: 17.9 pg — ABNORMAL LOW (ref 26.6–33.0)
MCHC: 27.8 g/dL — ABNORMAL LOW (ref 31.5–35.7)
MCV: 65 fL — ABNORMAL LOW (ref 79–97)
Monocytes Absolute: 0.6 10*3/uL (ref 0.1–0.9)
Monocytes: 8 %
Neutrophils Absolute: 3.4 10*3/uL (ref 1.4–7.0)
Neutrophils: 45 %
Platelets: 341 10*3/uL (ref 150–450)
RBC: 4.57 x10E6/uL (ref 3.77–5.28)
RDW: 18.1 % — ABNORMAL HIGH (ref 11.7–15.4)
WBC: 7.6 10*3/uL (ref 3.4–10.8)

## 2019-12-31 LAB — COMPREHENSIVE METABOLIC PANEL WITH GFR
ALT: 13 [IU]/L (ref 0–32)
AST: 20 [IU]/L (ref 0–40)
Albumin/Globulin Ratio: 1.6 (ref 1.2–2.2)
Albumin: 4.1 g/dL (ref 3.8–4.8)
Alkaline Phosphatase: 55 [IU]/L (ref 39–117)
BUN/Creatinine Ratio: 16 (ref 9–23)
BUN: 10 mg/dL (ref 6–24)
Bilirubin Total: 0.3 mg/dL (ref 0.0–1.2)
CO2: 22 mmol/L (ref 20–29)
Calcium: 8.9 mg/dL (ref 8.7–10.2)
Chloride: 104 mmol/L (ref 96–106)
Creatinine, Ser: 0.61 mg/dL (ref 0.57–1.00)
GFR calc Af Amer: 125 mL/min/{1.73_m2}
GFR calc non Af Amer: 108 mL/min/{1.73_m2}
Globulin, Total: 2.6 g/dL (ref 1.5–4.5)
Glucose: 92 mg/dL (ref 65–99)
Potassium: 4.3 mmol/L (ref 3.5–5.2)
Sodium: 138 mmol/L (ref 134–144)
Total Protein: 6.7 g/dL (ref 6.0–8.5)

## 2019-12-31 LAB — IRON,TIBC AND FERRITIN PANEL
Ferritin: 4 ng/mL — ABNORMAL LOW (ref 15–150)
Iron Saturation: 3 % — CL (ref 15–55)
Iron: 17 ug/dL — ABNORMAL LOW (ref 27–159)
Total Iron Binding Capacity: 516 ug/dL — ABNORMAL HIGH (ref 250–450)
UIBC: 499 ug/dL — ABNORMAL HIGH (ref 131–425)

## 2019-12-31 LAB — HEMOGLOBIN A1C
Est. average glucose Bld gHb Est-mCnc: 128 mg/dL
Hgb A1c MFr Bld: 6.1 % — ABNORMAL HIGH (ref 4.8–5.6)

## 2019-12-31 LAB — TSH: TSH: 2.53 u[IU]/mL (ref 0.450–4.500)

## 2019-12-31 LAB — VITAMIN D 25 HYDROXY (VIT D DEFICIENCY, FRACTURES): Vit D, 25-Hydroxy: 20.1 ng/mL — ABNORMAL LOW (ref 30.0–100.0)

## 2019-12-31 MED ORDER — FERROUS SULFATE 325 (65 FE) MG PO TBEC: DELAYED_RELEASE_TABLET | ORAL | 4 refills | Status: DC

## 2019-12-31 MED ORDER — VITAMIN D (ERGOCALCIFEROL) 1.25 MG (50000 UNIT) PO CAPS
50000.0000 [IU] | ORAL_CAPSULE | ORAL | 3 refills | Status: DC
Start: 2019-12-31 — End: 2020-05-02

## 2020-01-04 ENCOUNTER — Telehealth: Payer: Self-pay

## 2020-01-04 NOTE — Telephone Encounter (Signed)
Patient was given lab results in clinic on 01/04/2020 by CMA Elsi Stelzer F.

## 2020-04-28 ENCOUNTER — Ambulatory Visit: Payer: Medicaid Other | Attending: Family Medicine | Admitting: Family Medicine

## 2020-04-28 ENCOUNTER — Other Ambulatory Visit: Payer: Self-pay

## 2020-04-28 VITALS — BP 111/81 | HR 70 | Ht 62.0 in | Wt 213.0 lb

## 2020-04-28 DIAGNOSIS — Z1159 Encounter for screening for other viral diseases: Secondary | ICD-10-CM | POA: Diagnosis not present

## 2020-04-28 DIAGNOSIS — N939 Abnormal uterine and vaginal bleeding, unspecified: Secondary | ICD-10-CM | POA: Diagnosis not present

## 2020-04-28 DIAGNOSIS — R7303 Prediabetes: Secondary | ICD-10-CM | POA: Diagnosis not present

## 2020-04-28 DIAGNOSIS — E559 Vitamin D deficiency, unspecified: Secondary | ICD-10-CM

## 2020-04-28 DIAGNOSIS — D500 Iron deficiency anemia secondary to blood loss (chronic): Secondary | ICD-10-CM | POA: Diagnosis not present

## 2020-04-28 LAB — POCT GLYCOSYLATED HEMOGLOBIN (HGB A1C): HbA1c, POC (controlled diabetic range): 5.5 % (ref 0.0–7.0)

## 2020-04-28 MED ORDER — FERROUS SULFATE 325 (65 FE) MG PO TBEC: DELAYED_RELEASE_TABLET | ORAL | 4 refills | Status: DC

## 2020-04-28 NOTE — Progress Notes (Signed)
Subjective:  Patient ID: Karen Gibbs, female    DOB: 1970-11-02  Age: 49 y.o. MRN: [id number]  CC: Hypertension   HPI Karen Gibbs is a 49 year old female with a history of prediabetes, anemia secondary to menorrhagia who presents today for chronic disease management.  She still has menorrhagia evaluated by GYN s/p IUD which fell out. Periods last every month and lasts 4 -5 days and are heavy. She underwent an endometrial biopsy by GYN which revealed: Endometrium, biopsy - DEGENERATING SECRETORY-TYPE ENDOMETRIUM - BENIGN ENDOCERVICAL-TYPE MUCOSA. - THERE IS NO EVIDENCE OF HYPERPLASIA OR MALIGNANCY  She does not want any medications but would like to wait till she attains menopause. Denies presence of lightheadedness, syncope or near syncope. Treated for vitamin D deficiency and would like to check her levels for improvement. Past Medical History:  Diagnosis Date  . Anemia   . Environmental allergies   . Fibroid     Past Surgical History:  Procedure Laterality Date  . CESAREAN SECTION     C/S x 2    No family history on file.  No Known Allergies  Outpatient Medications Prior to Visit  Medication Sig Dispense Refill  . ferrous sulfate 325 (65 FE) MG EC tablet 1 tablet 3 times daily with food for 1 week then 1 tablet twice daily 90 tablet 4  . Vitamin D, Ergocalciferol, (DRISDOL) 1.25 MG (50000 UNIT) CAPS capsule Take 1 capsule (50,000 Units total) by mouth every 7 (seven) days. 5 capsule 3  . norethindrone (AYGESTIN) 5 MG tablet Take 1 tablet (5 mg total) by mouth daily. (Patient not taking: Reported on 08/22/2018) 30 tablet 2  . norethindrone (AYGESTIN) 5 MG tablet 10mg  po q8h (Patient not taking: Reported on 04/28/2020) 60 tablet 2  . omeprazole (PRILOSEC) 20 MG capsule Take 1 capsule (20 mg total) by mouth 2 (two) times daily. To reduce stomach acid (Patient not taking: Reported on 09/10/2018) 60 capsule 4   No facility-administered medications prior to visit.      ROS Review of Systems  Constitutional: Negative for activity change, appetite change and fatigue.  HENT: Negative for congestion, sinus pressure and sore throat.   Eyes: Negative for visual disturbance.  Respiratory: Negative for cough, chest tightness, shortness of breath and wheezing.   Cardiovascular: Negative for chest pain and palpitations.  Gastrointestinal: Negative for abdominal distention, abdominal pain and constipation.  Endocrine: Negative for polydipsia.  Genitourinary: Negative for dysuria and frequency.  Musculoskeletal: Negative for arthralgias and back pain.  Skin: Negative for rash.  Neurological: Negative for tremors, light-headedness and numbness.  Hematological: Does not bruise/bleed easily.  Psychiatric/Behavioral: Negative for agitation and behavioral problems.    Objective:  BP 111/81   Pulse 70   Ht 5\' 2"  (1.575 m)   Wt 213 lb (96.6 kg)   SpO2 98%   BMI 38.96 kg/m   BP/Weight 04/28/2020 2/58/5277 04/04/4234  Systolic BP 361 443 154  Diastolic BP 81 84 99  Wt. (Lbs) 213 219.8 213.3  BMI 38.96 40.2 39.01      Physical Exam Constitutional:      Appearance: She is well-developed.  Neck:     Vascular: No JVD.  Cardiovascular:     Rate and Rhythm: Normal rate.     Heart sounds: Normal heart sounds. No murmur heard.   Pulmonary:     Effort: Pulmonary effort is normal.     Breath sounds: Normal breath sounds. No wheezing or rales.  Chest:     Chest wall: No  tenderness.  Abdominal:     General: Bowel sounds are normal. There is no distension.     Palpations: Abdomen is soft. There is no mass.     Tenderness: There is no abdominal tenderness.  Musculoskeletal:        General: Normal range of motion.     Right lower leg: No edema.     Left lower leg: No edema.  Neurological:     Mental Status: She is alert and oriented to person, place, and time.  Psychiatric:        Mood and Affect: Mood normal.     CMP Latest Ref Rng & Units  12/30/2019 01/11/2016 10/08/2014  Glucose 65 - 99 mg/dL 92 - 64(L)  BUN 6 - 24 mg/dL 10 - 12  Creatinine 0.57 - 1.00 mg/dL 0.61 0.50 0.50  Sodium 134 - 144 mmol/L 138 - 138  Potassium 3.5 - 5.2 mmol/L 4.3 - 4.2  Chloride 96 - 106 mmol/L 104 - 104  CO2 20 - 29 mmol/L 22 - 28  Calcium 8.7 - 10.2 mg/dL 8.9 - 8.9  Total Protein 6.0 - 8.5 g/dL 6.7 - 6.6  Total Bilirubin 0.0 - 1.2 mg/dL 0.3 - 0.7  Alkaline Phos 39 - 117 IU/L 55 - 37(L)  AST 0 - 40 IU/L 20 - 23  ALT 0 - 32 IU/L 13 - 14    Lipid Panel  No results found for: CHOL, TRIG, HDL, CHOLHDL, VLDL, LDLCALC, LDLDIRECT  CBC    Component Value Date/Time   WBC 7.6 12/30/2019 1625   WBC 6.4 10/15/2017 1443   RBC 4.57 12/30/2019 1625   RBC 4.15 10/15/2017 1443   HGB 8.2 (L) 12/30/2019 1625   HCT 29.5 (L) 12/30/2019 1625   PLT 341 12/30/2019 1625   MCV 65 (L) 12/30/2019 1625   MCH 17.9 (L) 12/30/2019 1625   MCH 23.9 (L) 10/15/2017 1443   MCHC 27.8 (L) 12/30/2019 1625   MCHC 31.9 10/15/2017 1443   RDW 18.1 (H) 12/30/2019 1625   LYMPHSABS 3.3 (H) 12/30/2019 1625   MONOABS 0.5 10/08/2014 1256   EOSABS 0.1 12/30/2019 1625   BASOSABS 0.1 12/30/2019 1625    Lab Results  Component Value Date   HGBA1C 5.5 04/28/2020     Assessment & Plan:  1. Pre-diabetes Controlled with A1c of 5.5 Commended on improvement as A1c has dropped from 6.1 previously Continue lifestyle modifications - POCT glycosylated hemoglobin (Hb A1C)  2. Iron deficiency anemia due to chronic blood loss Secondary to menorrhagia We will check hemoglobin Continue ferrous sulfate - CBC with Differential/Platelet - ferrous sulfate 325 (65 FE) MG EC tablet; 1 tablet 3 times daily with food for 1 week then 1 tablet twice daily  Dispense: 90 tablet; Refill: 4  3. Abnormal uterine bleeding She declines additional intervention at this time and would rather wait until she obtains menopause  4. Need for hepatitis C screening test - HCV RNA quant rflx ultra or  genotyp([facility])  5. Vitamin D deficiency - VITAMIN D 25 Hydroxy (Vit-D Deficiency, Fractures)      Charlott Rakes, MD, FAAFP. [facility] and [place], [place]   04/28/2020, 12:00 PM

## 2020-04-30 LAB — CBC WITH DIFFERENTIAL/PLATELET
Basophils Absolute: 0 10*3/uL (ref 0.0–0.2)
Basos: 1 %
EOS (ABSOLUTE): 0.1 10*3/uL (ref 0.0–0.4)
Eos: 3 %
Hematocrit: 37.2 % (ref 34.0–46.6)
Hemoglobin: 10.6 g/dL — ABNORMAL LOW (ref 11.1–15.9)
Immature Grans (Abs): 0 10*3/uL (ref 0.0–0.1)
Immature Granulocytes: 0 %
Lymphocytes Absolute: 2.5 10*3/uL (ref 0.7–3.1)
Lymphs: 45 %
MCH: 21.1 pg — ABNORMAL LOW (ref 26.6–33.0)
MCHC: 28.5 g/dL — ABNORMAL LOW (ref 31.5–35.7)
MCV: 74 fL — ABNORMAL LOW (ref 79–97)
Monocytes Absolute: 0.6 10*3/uL (ref 0.1–0.9)
Monocytes: 10 %
Neutrophils Absolute: 2.3 10*3/uL (ref 1.4–7.0)
Neutrophils: 41 %
Platelets: 254 10*3/uL (ref 150–450)
RBC: 5.02 x10E6/uL (ref 3.77–5.28)
RDW: 17.8 % — ABNORMAL HIGH (ref 11.7–15.4)
WBC: 5.5 10*3/uL (ref 3.4–10.8)

## 2020-04-30 LAB — VITAMIN D 25 HYDROXY (VIT D DEFICIENCY, FRACTURES): Vit D, 25-Hydroxy: 28.9 ng/mL — ABNORMAL LOW (ref 30.0–100.0)

## 2020-04-30 LAB — HCV RNA QUANT RFLX ULTRA OR GENOTYP: HCV Quant Baseline: NOT DETECTED IU/mL

## 2020-05-02 ENCOUNTER — Telehealth: Payer: Self-pay

## 2020-05-02 ENCOUNTER — Other Ambulatory Visit (INDEPENDENT_AMBULATORY_CARE_PROVIDER_SITE_OTHER): Payer: Self-pay | Admitting: Family Medicine

## 2020-05-02 MED ORDER — VITAMIN D (ERGOCALCIFEROL) 1.25 MG (50000 UNIT) PO CAPS
50000.0000 [IU] | ORAL_CAPSULE | ORAL | 0 refills | Status: DC
Start: 2020-05-02 — End: 2020-09-26

## 2020-05-02 NOTE — Telephone Encounter (Signed)
-----   Message from Charlott Rakes, MD sent at 05/02/2020 10:56 AM EDT ----- Labs reveal she is still anemic but this has slightly improved compared to previous. Continue iron pills. Vitamin D is slightly low and I have refilled her Drisdol

## 2020-05-02 NOTE — Telephone Encounter (Signed)
Patient name and DOB has been verified Patient was informed of lab results. Patient had no questions.  

## 2020-06-23 ENCOUNTER — Encounter: Payer: Self-pay | Admitting: Internal Medicine

## 2020-06-23 ENCOUNTER — Other Ambulatory Visit: Payer: Self-pay

## 2020-06-23 ENCOUNTER — Ambulatory Visit: Payer: No Typology Code available for payment source | Attending: Internal Medicine | Admitting: Internal Medicine

## 2020-06-23 VITALS — BP 122/82 | HR 72 | Resp 16 | Wt 213.2 lb

## 2020-06-23 DIAGNOSIS — N921 Excessive and frequent menstruation with irregular cycle: Secondary | ICD-10-CM | POA: Diagnosis not present

## 2020-06-23 NOTE — Progress Notes (Signed)
Patient ID: Karen Gibbs, female    DOB: August 17, 1971  MRN: [id number]  CC: Vaginal Bleeding   Subjective: Karen Gibbs is a 49 y.o. female who presents for UC visit Her concerns today include:  history of prediabetes, anemia secondary to menorrhagia who presents for UC visit  Pt c/o vaginal bleeding from 9/2-20/2021.  Then started again 10/5 until now.  Some days heavy, other days light.  Currently light bleeding. Positve clots when bleeding is heavy. Endorses fatigue and dizziness sometimes.  Taking 3 iron tabs daily.  Last engaged in intercourse last mth.   She was seeing GYN for menorrhagia.  Had IUD for 3 mths but came it.  She tells me she was also tried with hormone pills but they cause her to bleed even heavier.  Patient Active Problem List   Diagnosis Date Noted  . Abnormal uterine bleeding (AUB) 07/25/2018  . IUD threads lost 07/25/2018  . Language barrier 07/25/2018  . Bacterial vaginitis 10/28/2017  . Menorrhagia with regular cycle 11/25/2014  . Dysmenorrhea 11/25/2014     Current Outpatient Medications on File Prior to Visit  Medication Sig Dispense Refill  . ferrous sulfate 325 (65 FE) MG EC tablet 1 tablet 3 times daily with food for 1 week then 1 tablet twice daily 90 tablet 4  . norethindrone (AYGESTIN) 5 MG tablet Take 1 tablet (5 mg total) by mouth daily. (Patient not taking: Reported on 08/22/2018) 30 tablet 2  . norethindrone (AYGESTIN) 5 MG tablet 10mg  po q8h (Patient not taking: Reported on 04/28/2020) 60 tablet 2  . omeprazole (PRILOSEC) 20 MG capsule Take 1 capsule (20 mg total) by mouth 2 (two) times daily. To reduce stomach acid (Patient not taking: Reported on 09/10/2018) 60 capsule 4  . Vitamin D, Ergocalciferol, (DRISDOL) 1.25 MG (50000 UNIT) CAPS capsule Take 1 capsule (50,000 Units total) by mouth every 7 (seven) days. 12 capsule 0   No current facility-administered medications on file prior to visit.    No Known Allergies  Social History    Socioeconomic History  . Marital status: Married    Spouse name: Not on file  . Number of children: Not on file  . Years of education: Not on file  . Highest education level: Not on file  Occupational History  . Not on file  Tobacco Use  . Smoking status: Never Smoker  . Smokeless tobacco: Never Used  Vaping Use  . Vaping Use: Never used  Substance and Sexual Activity  . Alcohol use: No  . Drug use: No  . Sexual activity: Not Currently  Other Topics Concern  . Not on file  Social History Narrative  . Not on file   Social Determinants of Health   Financial Resource Strain:   . Difficulty of Paying Living Expenses: Not on file  Food Insecurity:   . Worried About Charity fundraiser in the Last Year: Not on file  . Ran Out of Food in the Last Year: Not on file  Transportation Needs:   . Lack of Transportation (Medical): Not on file  . Lack of Transportation (Non-Medical): Not on file  Physical Activity:   . Days of Exercise per Week: Not on file  . Minutes of Exercise per Session: Not on file  Stress:   . Feeling of Stress : Not on file  Social Connections:   . Frequency of Communication with Friends and Family: Not on file  . Frequency of Social Gatherings with Friends and Family: Not  on file  . Attends Religious Services: Not on file  . Active Member of Clubs or Organizations: Not on file  . Attends Archivist Meetings: Not on file  . Marital Status: Not on file  Intimate Partner Violence:   . Fear of Current or Ex-Partner: Not on file  . Emotionally Abused: Not on file  . Physically Abused: Not on file  . Sexually Abused: Not on file    No family history on file.  Past Surgical History:  Procedure Laterality Date  . CESAREAN SECTION     C/S x 2    ROS: Review of Systems Negative except as stated above  PHYSICAL EXAM: BP 122/82   Pulse 72   Resp 16   Wt 213 lb 3.2 oz (96.7 kg)   SpO2 100%   BMI 38.99 kg/m   Physical Exam  General  appearance - alert, well appearing, pleasant middle-age African female and in no distress Mental status - normal mood, behavior, speech, dress, motor activity, and thought processes   CMP Latest Ref Rng & Units 12/30/2019 01/11/2016 10/08/2014  Glucose 65 - 99 mg/dL 92 - 64(L)  BUN 6 - 24 mg/dL 10 - 12  Creatinine 0.57 - 1.00 mg/dL 0.61 0.50 0.50  Sodium 134 - 144 mmol/L 138 - 138  Potassium 3.5 - 5.2 mmol/L 4.3 - 4.2  Chloride 96 - 106 mmol/L 104 - 104  CO2 20 - 29 mmol/L 22 - 28  Calcium 8.7 - 10.2 mg/dL 8.9 - 8.9  Total Protein 6.0 - 8.5 g/dL 6.7 - 6.6  Total Bilirubin 0.0 - 1.2 mg/dL 0.3 - 0.7  Alkaline Phos 39 - 117 IU/L 55 - 37(L)  AST 0 - 40 IU/L 20 - 23  ALT 0 - 32 IU/L 13 - 14   Lipid Panel  No results found for: CHOL, TRIG, HDL, CHOLHDL, VLDL, LDLCALC, LDLDIRECT  CBC    Component Value Date/Time   WBC 5.5 04/28/2020 1215   WBC 6.4 10/15/2017 1443   RBC 5.02 04/28/2020 1215   RBC 4.15 10/15/2017 1443   HGB 10.6 (L) 04/28/2020 1215   HCT 37.2 04/28/2020 1215   PLT 254 04/28/2020 1215   MCV 74 (L) 04/28/2020 1215   MCH 21.1 (L) 04/28/2020 1215   MCH 23.9 (L) 10/15/2017 1443   MCHC 28.5 (L) 04/28/2020 1215   MCHC 31.9 10/15/2017 1443   RDW 17.8 (H) 04/28/2020 1215   LYMPHSABS 2.5 04/28/2020 1215   MONOABS 0.5 10/08/2014 1256   EOSABS 0.1 04/28/2020 1215   BASOSABS 0.0 04/28/2020 1215    ASSESSMENT AND PLAN:  1. Menorrhagia with irregular cycle Patient with prior and ongoing history of menorrhagia and anemia.  She denies urine pregnancy test at this time.  We will check a CBC.  She will continue iron supplement.  I did not prescribe megestrol as it seems she had this in the past.  I told her I best bet is to try to get her to the gynecologist as soon as possible for further evaluation and management.  She states she would like to be considered for hysterectomy which she can discuss with the gynecologist. - CBC - Ambulatory referral to Gynecology    Patient was  given the opportunity to ask questions.  Patient verbalized understanding of the plan and was able to repeat key elements of the plan.   Orders Placed This Encounter  Procedures  . CBC  . Ambulatory referral to Gynecology     Requested Prescriptions  No prescriptions requested or ordered in this encounter    No follow-ups on file.  Karle Plumber, MD, FACP

## 2020-06-24 LAB — CBC
Hematocrit: 34.2 % (ref 34.0–46.6)
Hemoglobin: 10.1 g/dL — ABNORMAL LOW (ref 11.1–15.9)
MCH: 22.4 pg — ABNORMAL LOW (ref 26.6–33.0)
MCHC: 29.5 g/dL — ABNORMAL LOW (ref 31.5–35.7)
MCV: 76 fL — ABNORMAL LOW (ref 79–97)
Platelets: 275 10*3/uL (ref 150–450)
RBC: 4.51 x10E6/uL (ref 3.77–5.28)
RDW: 15.8 % — ABNORMAL HIGH (ref 11.7–15.4)
WBC: 7.6 10*3/uL (ref 3.4–10.8)

## 2020-06-29 ENCOUNTER — Other Ambulatory Visit: Payer: Self-pay | Admitting: Internal Medicine

## 2020-06-29 DIAGNOSIS — D500 Iron deficiency anemia secondary to blood loss (chronic): Secondary | ICD-10-CM

## 2020-06-29 MED ORDER — FERROUS SULFATE 325 (65 FE) MG PO TBEC: 325.0000 mg | DELAYED_RELEASE_TABLET | Freq: Two times a day (BID) | ORAL | 4 refills | Status: DC

## 2020-07-08 IMAGING — MG DIGITAL SCREENING BILATERAL MAMMOGRAM WITH CAD
4 series · 4 of 4 positions shown · non-contrast
Comparison: Previous exam(s).

CLINICAL DATA: Screening.

EXAM:
DIGITAL SCREENING BILATERAL MAMMOGRAM WITH CAD

[R MLO]
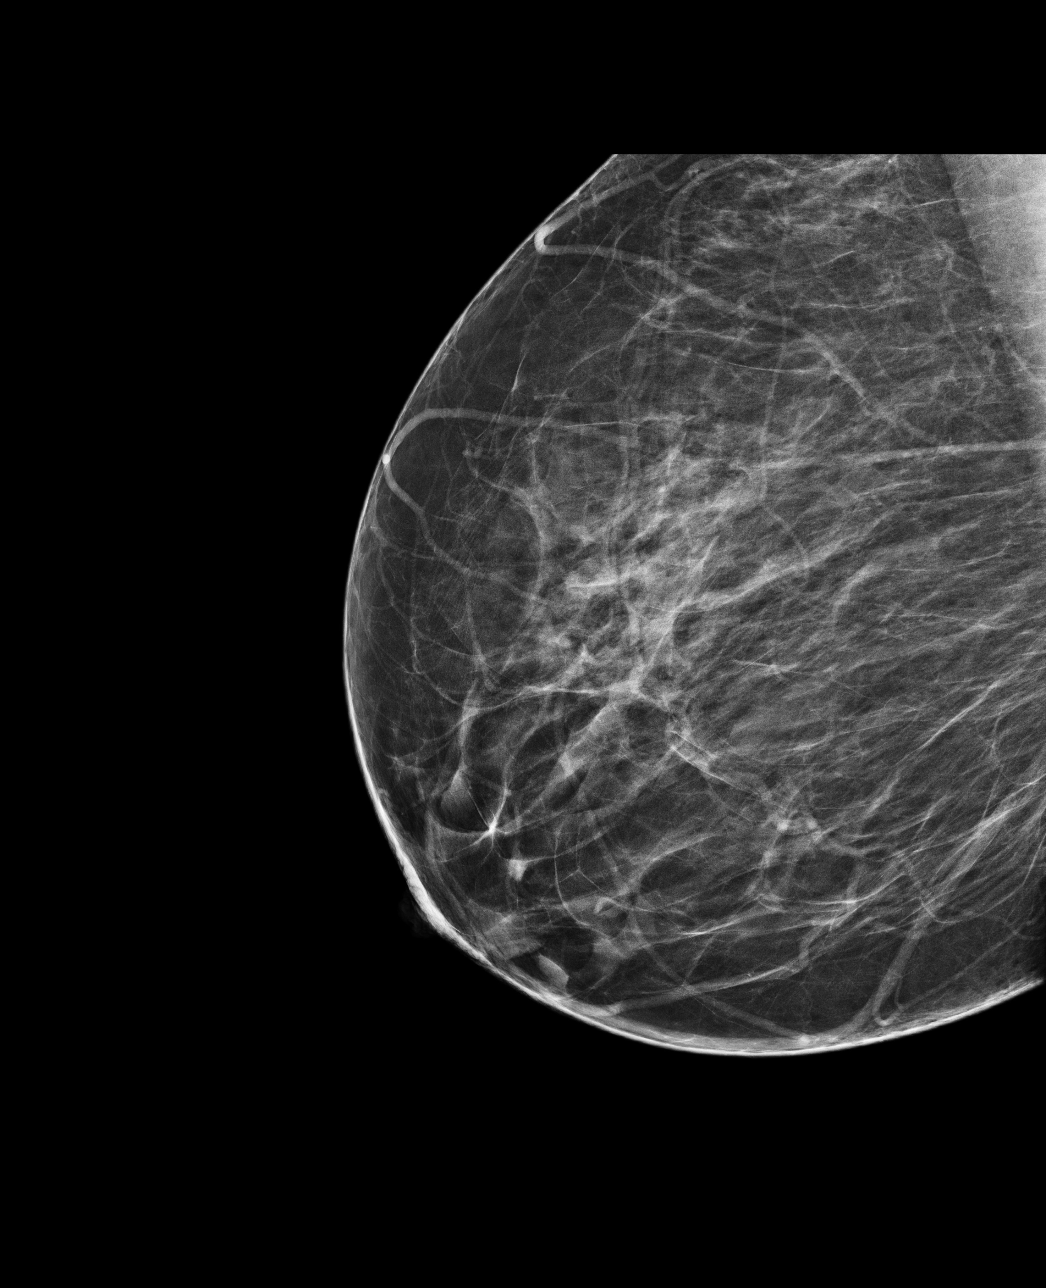

[L CC]
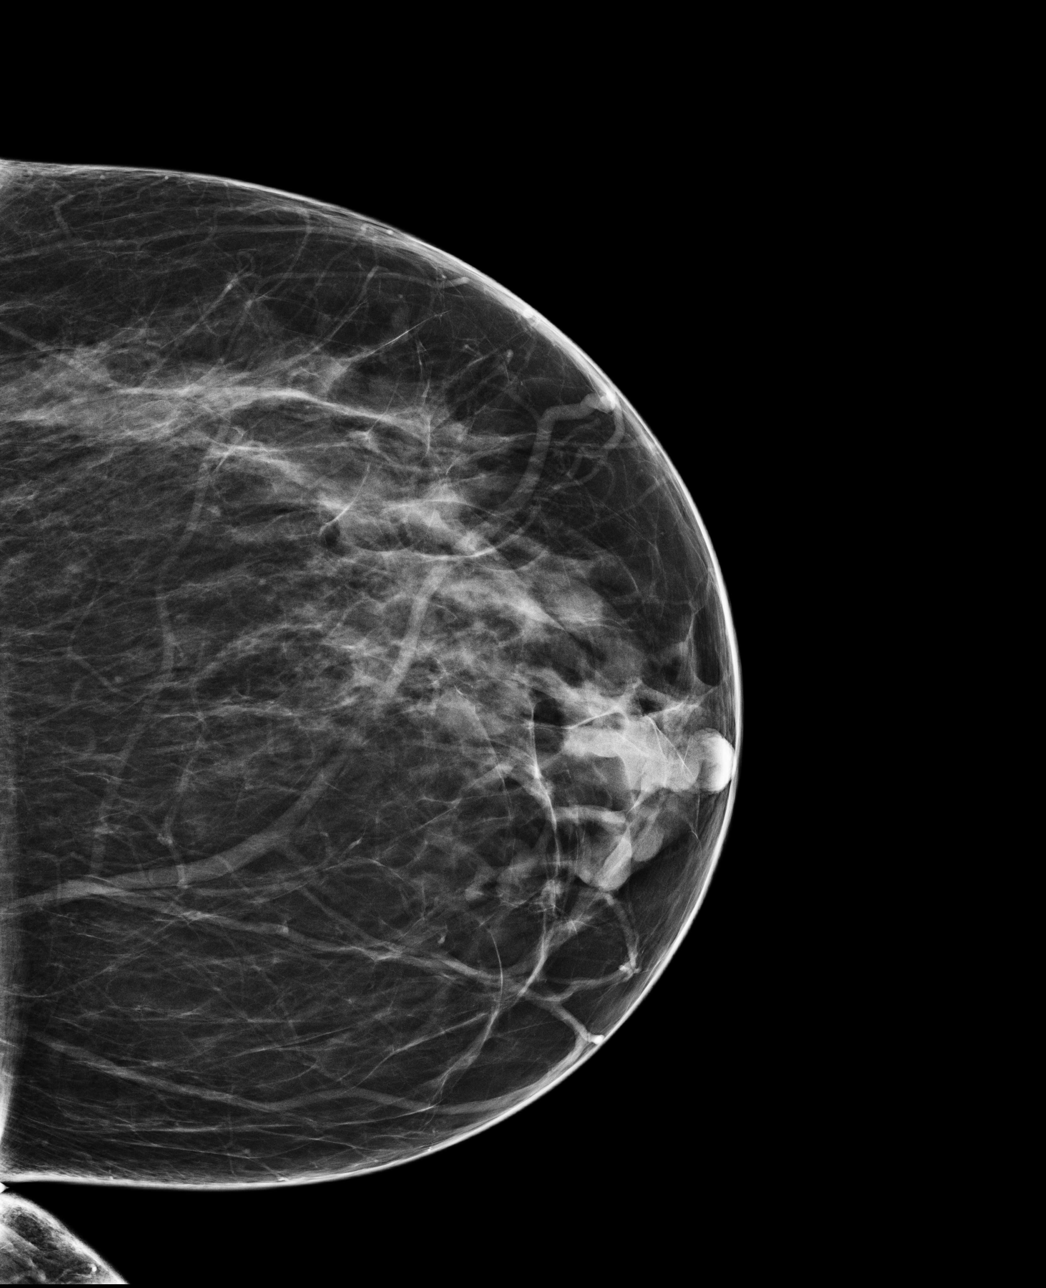

[R CC]
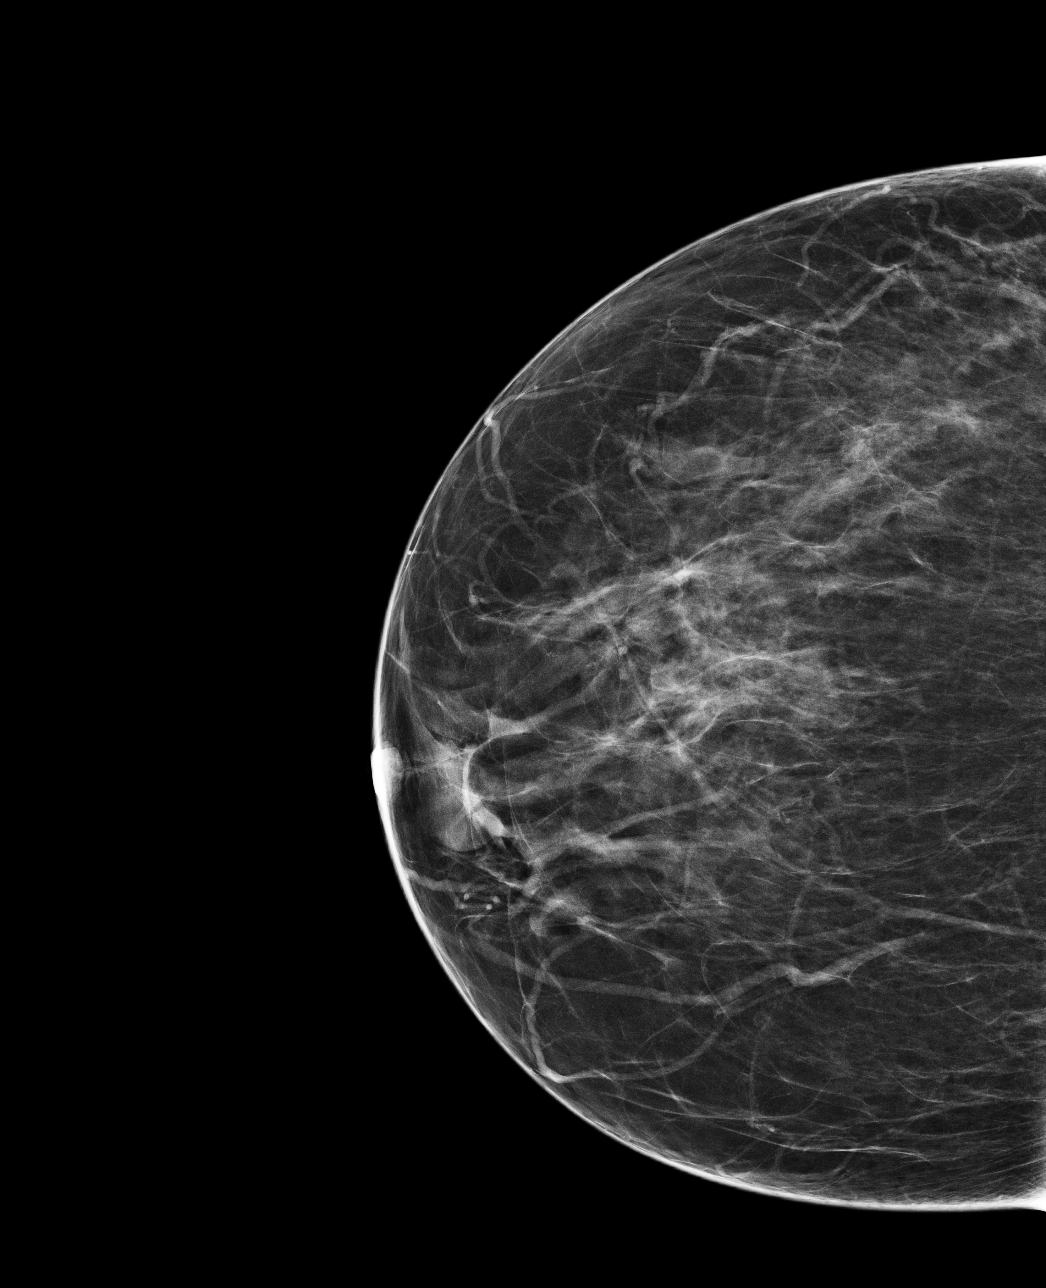

[L MLO]
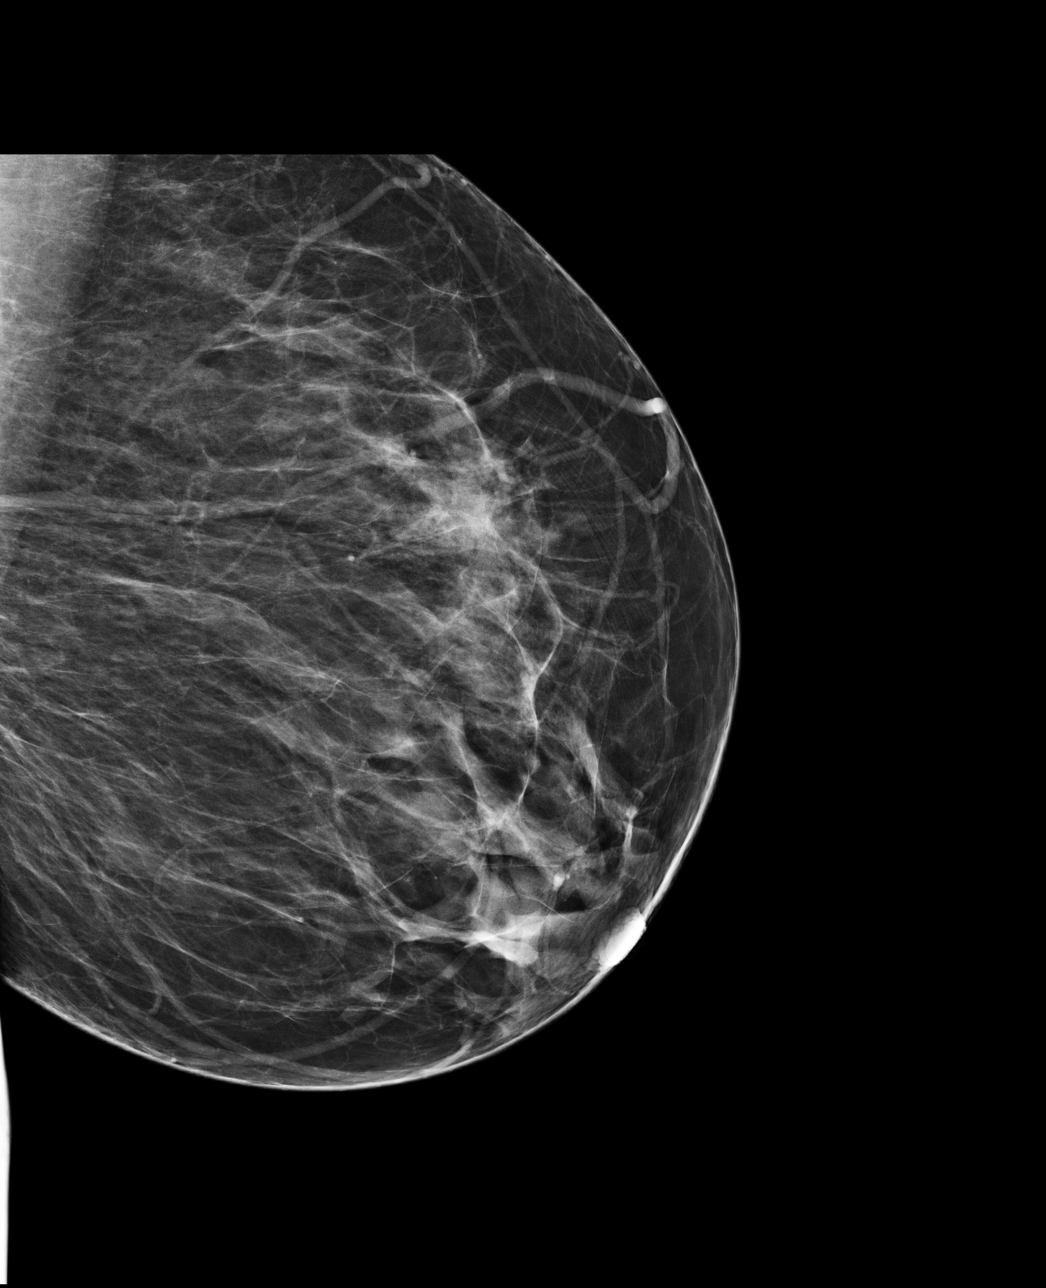

[4 of 4 positions shown; findings below may reference images not displayed]

ACR Breast Density Category b: There are scattered areas of
fibroglandular density.
FINDINGS: There are no findings suspicious for malignancy. Images were
processed with CAD.
IMPRESSION: No mammographic evidence of malignancy. A result letter of this
screening mammogram will be mailed directly to the patient.

RECOMMENDATION:
Screening mammogram in one year. (Code:AS-G-LCT)

BI-RADS CATEGORY  1: Negative.

## 2020-08-17 ENCOUNTER — Ambulatory Visit (HOSPITAL_COMMUNITY)
Admission: EM | Admit: 2020-08-17 | Discharge: 2020-08-17 | Discharge status: Against Medical Advice | Payer: No Typology Code available for payment source

## 2020-08-17 ENCOUNTER — Other Ambulatory Visit: Payer: Self-pay

## 2020-08-18 ENCOUNTER — Other Ambulatory Visit: Payer: No Typology Code available for payment source

## 2020-08-18 DIAGNOSIS — Z20822 Contact with and (suspected) exposure to covid-19: Secondary | ICD-10-CM

## 2020-08-19 LAB — SARS-COV-2, NAA 2 DAY TAT

## 2020-08-19 LAB — NOVEL CORONAVIRUS, NAA: SARS-CoV-2, NAA: DETECTED — AB

## 2020-08-20 ENCOUNTER — Telehealth: Payer: Self-pay | Admitting: Physician Assistant

## 2020-08-20 NOTE — Telephone Encounter (Signed)
Called to Discuss with patient about Covid symptoms and the use of the monoclonal antibody infusion for those with mild to moderate Covid symptoms and at a high risk of hospitalization.     Pt appears to qualify for this infusion due to co-morbid conditions and/or a member of an at-risk group in accordance with the FDA Emergency Use Authorization.    Unable to reach pt - left a VM and sent [place].   She qualifies with high SVI and BMI, also pre-diabetes.

## 2020-09-19 ENCOUNTER — Ambulatory Visit: Payer: No Typology Code available for payment source | Admitting: Obstetrics and Gynecology

## 2020-09-26 ENCOUNTER — Other Ambulatory Visit: Payer: Self-pay

## 2020-09-26 ENCOUNTER — Encounter: Payer: Self-pay | Admitting: Obstetrics and Gynecology

## 2020-09-26 ENCOUNTER — Ambulatory Visit (INDEPENDENT_AMBULATORY_CARE_PROVIDER_SITE_OTHER): Payer: No Typology Code available for payment source | Admitting: Obstetrics and Gynecology

## 2020-09-26 VITALS — BP 144/80 | HR 79 | Wt 211.9 lb

## 2020-09-26 DIAGNOSIS — Z789 Other specified health status: Secondary | ICD-10-CM | POA: Diagnosis not present

## 2020-09-26 DIAGNOSIS — N939 Abnormal uterine and vaginal bleeding, unspecified: Secondary | ICD-10-CM

## 2020-09-26 DIAGNOSIS — Z603 Acculturation difficulty: Secondary | ICD-10-CM

## 2020-09-26 NOTE — Progress Notes (Signed)
   GYNECOLOGY OFFICE FOLLOW UP NOTE  History:  50 y.o. [id number] here today for follow up for heavy, irregular menses. Had workup several years ago and had IUD placed but it expelled 2019. Has not been taking medication since then. She bleeds every month and reports it is heavy the entire time. Last period, she bled for 6 weeks. She does not always bleed for the full 6 weeks.   Has been on Megace and Aygestin, no improvement with aygestin and couldn't tolerate the megace. Negative EMB 09/2018. Had IUD in for 6 weeks, never stopped bleeding with it.      Past Medical History:  Diagnosis Date  . Anemia   . Environmental allergies   . Fibroid    Past Surgical History:  Procedure Laterality Date  . CESAREAN SECTION     C/S x 2    Current Outpatient Medications:  .  ferrous sulfate 325 (65 FE) MG EC tablet, Take 1 tablet (325 mg total) by mouth 2 (two) times daily., Disp: 60 tablet, Rfl: 4  The following portions of the patient's history were reviewed and updated as appropriate: allergies, current medications, past family history, past medical history, past social history, past surgical history and problem list.   Pap: 07/2018: normal  Review of Systems:  Pertinent items noted in HPI and remainder of comprehensive ROS otherwise negative.   Objective:  Physical Exam BP (!) 144/80   Pulse 79   Wt 211 lb 14.4 oz (96.1 kg)   LMP 09/17/2020 (Exact Date)   BMI 38.76 kg/m  CONSTITUTIONAL: Well-developed, well-nourished female in no acute distress.  HENT:  Normocephalic, atraumatic. External right and left ear normal. Oropharynx is clear and moist EYES: Conjunctivae and EOM are normal. Pupils are equal, round, and reactive to light. No scleral icterus.  NECK: Normal range of motion, supple, no masses SKIN: Skin is warm and dry. No rash noted. Not diaphoretic. No erythema. No pallor. NEUROLOGIC: Alert and oriented to person, place, and time. Normal reflexes, muscle tone coordination. No  cranial nerve deficit noted. PSYCHIATRIC: Normal mood and affect. Normal behavior. Normal judgment and thought content. CARDIOVASCULAR: Normal heart rate noted RESPIRATORY: Effort normal, no problems with respiration noted ABDOMEN: Soft, no distention noted.   PELVIC: deferred MUSCULOSKELETAL: Normal range of motion. No edema noted.  Labs and Imaging No results found.  Assessment & Plan:  1. Abnormal uterine bleeding - Heavy irregular menses, failed LnIUD, oral therapy, desiring definitive management - declines EMB today, would like to take pain meds prior as it was quite painful last time (09/2018) - EMB and surgical discussion next visit - briefly reviewed hysterectomy today, answered all questions - US PELVIC COMPLETE WITH TRANSVAGINAL; Future  2. Language barrier Swahili interpretor used   Routine preventative health maintenance measures emphasized. Please refer to After Visit Summary for other counseling recommendations.   Return in about 4 weeks (around 10/24/2020) for after [place] for EMB.  Total face-to-face time with patient: 22 minutes. Over 50% of encounter was spent on counseling and coordination of care.  Feliz Beam, MD, [place] for [facility])

## 2020-09-26 NOTE — Progress Notes (Signed)
[facility] phone interpreter: Pt speaks English for visit.   Pt scheduled for PUS on 10/06/2020 at 1100. Call placed to pt to notify of PUS appt. Left detailed message for pt with appt information. Pt to return call to office with any questions or concerns.

## 2020-10-06 ENCOUNTER — Ambulatory Visit: Payer: No Typology Code available for payment source

## 2020-10-10 ENCOUNTER — Encounter: Payer: Self-pay | Admitting: Obstetrics and Gynecology

## 2020-10-10 ENCOUNTER — Other Ambulatory Visit: Payer: Self-pay

## 2020-10-10 ENCOUNTER — Ambulatory Visit
Admission: RE | Admit: 2020-10-10 | Discharge: 2020-10-10 | Disposition: A | Discharge status: Home / Self Care | Payer: PRIVATE HEALTH INSURANCE | Source: Ambulatory Visit | Attending: Obstetrics and Gynecology | Admitting: Obstetrics and Gynecology

## 2020-10-10 DIAGNOSIS — N939 Abnormal uterine and vaginal bleeding, unspecified: Secondary | ICD-10-CM | POA: Diagnosis not present

## 2020-10-31 ENCOUNTER — Ambulatory Visit (INDEPENDENT_AMBULATORY_CARE_PROVIDER_SITE_OTHER): Payer: No Typology Code available for payment source | Admitting: Obstetrics and Gynecology

## 2020-10-31 ENCOUNTER — Other Ambulatory Visit: Payer: Self-pay

## 2020-10-31 ENCOUNTER — Other Ambulatory Visit: Payer: Self-pay | Admitting: Obstetrics & Gynecology

## 2020-10-31 ENCOUNTER — Other Ambulatory Visit: Payer: Self-pay | Admitting: Obstetrics and Gynecology

## 2020-10-31 ENCOUNTER — Encounter: Payer: Self-pay | Admitting: Obstetrics and Gynecology

## 2020-10-31 ENCOUNTER — Other Ambulatory Visit: Payer: Self-pay | Admitting: *Deleted

## 2020-10-31 VITALS — BP 152/95 | HR 79 | Ht 62.0 in | Wt 214.5 lb

## 2020-10-31 DIAGNOSIS — N939 Abnormal uterine and vaginal bleeding, unspecified: Secondary | ICD-10-CM | POA: Diagnosis not present

## 2020-10-31 DIAGNOSIS — Z789 Other specified health status: Secondary | ICD-10-CM

## 2020-10-31 DIAGNOSIS — R5381 Other malaise: Secondary | ICD-10-CM

## 2020-10-31 DIAGNOSIS — Z1231 Encounter for screening mammogram for malignant neoplasm of breast: Secondary | ICD-10-CM

## 2020-10-31 NOTE — Progress Notes (Signed)
GYNECOLOGY OFFICE FOLLOW UP NOTE  History:  50 y.o. G5P4010 here today for follow up for results of [place] and EMB.  She is still having heavy menses and her last period lasted 7 days, was heavy the whole time. She is ready for definitive management and would like a hysterectomy.  She declines EMB today.  Past Medical History:  Diagnosis Date  . Anemia   . Environmental allergies   . Fibroid     Past Surgical History:  Procedure Laterality Date  . CESAREAN SECTION     C/S x 2     Current Outpatient Medications:  .  ferrous sulfate 325 (65 FE) MG EC tablet, Take 1 tablet (325 mg total) by mouth 2 (two) times daily. (Patient not taking: Reported on 10/31/2020), Disp: 60 tablet, Rfl: 4  The following portions of the patient's history were reviewed and updated as appropriate: allergies, current medications, past family history, past medical history, past social history, past surgical history and problem list.   Review of Systems:  Pertinent items noted in HPI and remainder of comprehensive ROS otherwise negative.   Objective:  Physical Exam BP (!) 152/95 (BP Location: Left Arm)   Pulse 79   Ht 5\' 2"  (1.575 m)   Wt 214 lb 8 oz (97.3 kg)   BMI 39.23 kg/m  CONSTITUTIONAL: Well-developed, well-nourished female in no acute distress.  HENT:  Normocephalic, atraumatic. External right and left ear normal. Oropharynx is clear and moist EYES: Conjunctivae and EOM are normal. Pupils are equal, round, and reactive to light. No scleral icterus.  NECK: Normal range of motion, supple, no masses SKIN: Skin is warm and dry. No rash noted. Not diaphoretic. No erythema. No pallor. NEUROLOGIC: Alert and oriented to person, place, and time. Normal reflexes, muscle tone coordination. No cranial nerve deficit noted. PSYCHIATRIC: Normal mood and affect. Normal behavior. Normal judgment and thought content. CARDIOVASCULAR: Normal heart rate noted RESPIRATORY: Effort normal, no problems with  respiration noted ABDOMEN: Soft, no distention noted.   PELVIC: deferred MUSCULOSKELETAL: Normal range of motion. No edema noted.  Labs and Imaging US PELVIC COMPLETE WITH TRANSVAGINAL  Result Date: 10/10/2020 CLINICAL DATA:  Irregular menses, dysfunctional uterine bleeding. Prior Caesarean section. LMP 09/17/2020 EXAM: TRANSABDOMINAL AND TRANSVAGINAL ULTRASOUND OF PELVIS TECHNIQUE: Both transabdominal and transvaginal ultrasound examinations of the pelvis were performed. Transabdominal technique was performed for global imaging of the pelvis including uterus, ovaries, adnexal regions, and pelvic cul-de-sac. It was necessary to proceed with endovaginal exam following the transabdominal exam to visualize the endometrium and right ovary. COMPARISON:  07/29/2018 FINDINGS: Uterus Measurements: 12.9 x 5.6 x 8.9 cm = volume: 338 mL. There are multiple heterogeneously hypoechoic intramural masses noted within the fundus most in keeping with multiple small intrauterine fibroids measuring up to 2.1 cm in greatest dimension. The cervix is unremarkable. Endometrium Thickness: 21 mm.  No focal abnormality visualized. Right ovary Measurements: 3.5 x 2.0 x 2.2 cm = volume: 8 mL. Normal appearance/no adnexal mass. Left ovary Measurements: 3.9 x 2.6 x 3.0 cm (best seen transabdominally) = volume: 16 mL. Normal appearance/no adnexal mass. Other findings No abnormal free fluid. IMPRESSION: Abnormal thickening of the endometrium. If bleeding remains unresponsive to hormonal or medical therapy, focal lesion work-up with sonohysterogram should be considered. Endometrial biopsy should also be considered in pre-menopausal patients at high risk for endometrial carcinoma. (Ref: Radiological Reasoning: Algorithmic Workup of Abnormal Vaginal Bleeding with Endovaginal Sonography and Sonohysterography. AJR 2008; 237:S28-31) Multiple small uterine fibroids. Electronically Signed  By: Fidela Salisbury MD   On: 10/10/2020 11:29     Assessment & Plan:  1. Abnormal uterine bleeding - Has tried aygestin, megace and LngIUD with no improvement (IUD fell out) - ready for definitive management, would like hysterectomy - declines EMB today due to concern for pain, she is requesting anesthesia for procedure, sent oxycodone for pharmacy for her to take prior to appt and will also request hurricaine spray - reviewed routes of hysterectomy, hospital course, recovery  2. Language barrier [place] interpretor used   Routine preventative health maintenance measures emphasized. Please refer to After Visit Summary for other counseling recommendations.   Return in about 4 weeks (around 11/28/2020).  Total face-to-face time with patient: 22 minutes. Over 50% of encounter was spent on counseling and coordination of care.  Feliz Beam, MD, [place] for [facility])

## 2020-11-28 ENCOUNTER — Ambulatory Visit (INDEPENDENT_AMBULATORY_CARE_PROVIDER_SITE_OTHER): Payer: No Typology Code available for payment source | Admitting: Obstetrics and Gynecology

## 2020-11-28 ENCOUNTER — Other Ambulatory Visit: Payer: Self-pay

## 2020-11-28 ENCOUNTER — Encounter: Payer: Self-pay | Admitting: Obstetrics and Gynecology

## 2020-11-28 VITALS — BP 134/70 | HR 86 | Ht 62.0 in | Wt 217.9 lb

## 2020-11-28 DIAGNOSIS — N920 Excessive and frequent menstruation with regular cycle: Secondary | ICD-10-CM | POA: Diagnosis not present

## 2020-11-28 DIAGNOSIS — D219 Benign neoplasm of connective and other soft tissue, unspecified: Secondary | ICD-10-CM

## 2020-11-28 MED ORDER — TRANEXAMIC ACID 650 MG PO TABS: 1300.0000 mg | ORAL_TABLET | Freq: Three times a day (TID) | ORAL | 2 refills | Status: DC

## 2020-11-28 NOTE — Progress Notes (Signed)
Patient ID: Karen Gibbs, female   DOB: 1971-06-16, 50 y.o.   MRN: [id number] [name] presents for F/U of her heavy regular cycles See prior notes. Pt declines EMBX today d/t to insurance coverage. Had negative EMBX 09/2018 U/S 10/10/20 small uterine fibroids.  Has tried Aygestin and Megace in the past. IUD in the past expelled as well.  Cycles monthly, last 7-8 days, 5-6 days heavy, pads and tampons with depends at night time.  PE AF VSS Lungs clear  Heart RRR Abd soft + BS  A/P Menorrhagia with regular cycle        Uterine fibroids  Tx options reviewed with pt. Pt would like to try medication again, non hormonal. Will start TXA with next cycle. U/R/B discussed. Pt instructed to call office if needs but would still like better control of cycle. We can add hormonal medication to the TXA. Declined repeat IUD. F/U in 3-4 months.

## 2020-11-28 NOTE — Progress Notes (Signed)
States still having heavy periods. States can't have procedure or insurance due to insurance, Anyiah Coverdale,RN

## 2020-12-26 ENCOUNTER — Ambulatory Visit
Admission: RE | Admit: 2020-12-26 | Discharge: 2020-12-26 | Disposition: A | Discharge status: Home / Self Care | Payer: PRIVATE HEALTH INSURANCE | Source: Ambulatory Visit | Attending: Obstetrics and Gynecology | Admitting: Obstetrics and Gynecology

## 2020-12-26 ENCOUNTER — Other Ambulatory Visit: Payer: Self-pay

## 2020-12-26 DIAGNOSIS — Z1231 Encounter for screening mammogram for malignant neoplasm of breast: Secondary | ICD-10-CM

## 2021-03-05 ENCOUNTER — Other Ambulatory Visit: Payer: Self-pay | Admitting: Obstetrics and Gynecology

## 2021-03-05 DIAGNOSIS — N920 Excessive and frequent menstruation with regular cycle: Secondary | ICD-10-CM

## 2021-03-07 NOTE — Telephone Encounter (Signed)
Can you let her know that I've sent this in

## 2021-07-19 ENCOUNTER — Ambulatory Visit: Payer: Self-pay | Admitting: *Deleted

## 2021-07-19 NOTE — Telephone Encounter (Signed)
Severe lower back pain began suddenly upon awakening 24 hours ago and has been constant. Has to have assist moving.Does not radiate. May have had fever last night-did not check.No difficulty voiding with or bowels. Tylenol does not provide relief. 10 on the pain scale. Advised UC/ER-unsure if she will go due to no insurance.     Reason for Disposition  [1] SEVERE pain (e.g., excruciating, scale 8-10) AND [2] not improved after pain medicine  Answer Assessment - Initial Assessment Questions 1. ONSET: "When did the pain begin?"      Yesterday am 2. LOCATION: "Where does it hurt?" (upper, mid or lower back)     lower 3. SEVERITY: "How bad is the pain?"  (e.g., Scale 1-10; mild, moderate, or severe)   - MILD (1-3): doesn't interfere with normal activities    - MODERATE (4-7): interferes with normal activities or awakens from sleep    - SEVERE (8-10): excruciating pain, unable to do any normal activities      10 4. PATTERN: "Is the pain constant?" (e.g., yes, no; constant, intermittent)      constant 5. RADIATION: "Does the pain shoot into your legs or elsewhere?"     no 6. CAUSE:  "What do you think is causing the back pain?"      unsure 7. BACK OVERUSE:  "Any recent lifting of heavy objects, strenuous work or exercise?"     no 8. MEDICATIONS: "What have you taken so far for the pain?" (e.g., nothing, acetaminophen, NSAIDS)     tylenol 9. NEUROLOGIC SYMPTOMS: "Do you have any weakness, numbness, or problems with bowel/bladder control?"     no 10. OTHER SYMPTOMS: "Do you have any other symptoms?" (e.g., fever, abdominal pain, burning with urination, blood in urine)       no 11. PREGNANCY: "Is there any chance you are pregnant?" (e.g., yes, no; LMP)       na  Protocols used: Back Pain-A-AH, Flank Pain-A-AH

## 2021-12-26 ENCOUNTER — Other Ambulatory Visit: Payer: Self-pay

## 2021-12-26 DIAGNOSIS — Z1231 Encounter for screening mammogram for malignant neoplasm of breast: Secondary | ICD-10-CM

## 2021-12-27 NOTE — Addendum Note (Signed)
Addended by: Demetrius Revel on: 12/27/2021 09:55 AM ? ? Modules accepted: Orders ? ?

## 2021-12-27 NOTE — Addendum Note (Signed)
Addended by: Demetrius Revel on: 12/27/2021 09:54 AM ? ? Modules accepted: Orders ? ?

## 2021-12-28 ENCOUNTER — Ambulatory Visit
Admission: RE | Admit: 2021-12-28 | Discharge: 2021-12-28 | Disposition: A | Discharge status: Home / Self Care | Payer: No Typology Code available for payment source | Source: Ambulatory Visit | Attending: Family Medicine | Admitting: Family Medicine

## 2021-12-28 DIAGNOSIS — Z1231 Encounter for screening mammogram for malignant neoplasm of breast: Secondary | ICD-10-CM

## 2022-08-06 ENCOUNTER — Ambulatory Visit: Payer: Self-pay

## 2022-08-06 ENCOUNTER — Encounter (HOSPITAL_COMMUNITY): Payer: Self-pay

## 2022-08-06 ENCOUNTER — Ambulatory Visit (HOSPITAL_COMMUNITY)
Admission: EM | Admit: 2022-08-06 | Discharge: 2022-08-06 | Disposition: A | Discharge status: Home / Self Care | Payer: BC Managed Care – PPO | Attending: Nurse Practitioner | Admitting: Nurse Practitioner

## 2022-08-06 DIAGNOSIS — R1011 Right upper quadrant pain: Secondary | ICD-10-CM

## 2022-08-06 LAB — POCT URINALYSIS DIPSTICK, ED / UC
Bilirubin Urine: NEGATIVE
Glucose, UA: NEGATIVE mg/dL
Hgb urine dipstick: NEGATIVE
Leukocytes,Ua: NEGATIVE
Nitrite: NEGATIVE
Protein, ur: NEGATIVE mg/dL
Specific Gravity, Urine: 1.015 (ref 1.005–1.030)
Urobilinogen, UA: 0.2 mg/dL (ref 0.0–1.0)
pH: 5.5 (ref 5.0–8.0)

## 2022-08-06 NOTE — Discharge Instructions (Addendum)
Please go to the ER for further evaluation and treatment

## 2022-08-06 NOTE — Telephone Encounter (Signed)
  Chief Complaint: Abdominal Pain Symptoms: Upper middle abdominal pain Frequency: This morning Pertinent Negatives: Patient denies Chest pain , SOB Disposition: '[]'$ ED /'[x]'$ Urgent Care (no appt availability in office) / '[]'$ Appointment(In office/virtual)/ '[]'$  [facility]/ '[]'$ Home Care/ '[]'$ Refused Recommended Disposition /'[]'$ [facility]/ '[]'$  Follow-up with PCP Additional Notes: Pt states that pain started this morning. It comes and goes in her abdomen. No chest pain, nausea, vomiting or diarrhea.    Reason for Disposition  [1] MILD-MODERATE pain AND [2] constant AND [3] present > 2 hours  Answer Assessment - Initial Assessment Questions 1. LOCATION: "Where does it hurt?"      Upper in the middle 2. RADIATION: "Does the pain shoot anywhere else?" (e.g., chest, back)     To the chest 3. ONSET: "When did the pain begin?" (e.g., minutes, hours or days ago)      This morning 4. SUDDEN: "Gradual or sudden onset?"     sudden 5. PATTERN "Does the pain come and go, or is it constant?"    - If it comes and goes: "How long does it last?" "Do you have pain now?"     (Note: Comes and goes means the pain is intermittent. It goes away completely between bouts.)    - If constant: "Is it getting better, staying the same, or getting worse?"      (Note: Constant means the pain never goes away completely; most serious pain is constant and gets worse.)      Comes and goes 6. SEVERITY: "How bad is the pain?"  (e.g., Scale 1-10; mild, moderate, or severe)    - MILD (1-3): Doesn't interfere with normal activities, abdomen soft and not tender to touch..     - MODERATE (4-7): Interferes with normal activities or awakens from sleep, abdomen tender to touch.     - SEVERE (8-10): Excruciating pain, doubled over, unable to do any normal activities.       8-9/10 7. RECURRENT SYMPTOM: "Have you ever had this type of stomach pain before?" If Yes, ask: "When was the last time?" and "What happened that  time?"      no 8. AGGRAVATING FACTORS: "Does anything seem to cause this pain?" (e.g., foods, stress, alcohol)     Ate only salad yesterday 9. CARDIAC SYMPTOMS: "Do you have any of the following symptoms: chest pain, difficulty breathing, sweating, nausea?"     no 10. OTHER SYMPTOMS: "Do you have any other symptoms?" (e.g., back pain, diarrhea, fever, urination pain, vomiting)       no 11. PREGNANCY: "Is there any chance you are pregnant?" "When was your last menstrual period?"  Protocols used: Abdominal Pain - Upper-A-AH

## 2022-08-06 NOTE — ED Provider Notes (Signed)
[facility]    CSN: [id number] Arrival date & time: 08/06/22  1259      History   Chief Complaint Chief Complaint  Patient presents with   Abdominal Pain    HPI Karen Gibbs is a 51 y.o. female presents for evaluation of epigastric pain.  Patient reports this morning around 7 AM she developed epigastric pain that was intermittent and described as a aching/pressure type pain that did radiate through towards her back.  She denies any chest pain, shortness of breath, dizziness, palpitations, nausea/vomiting.  Does endorse fatigue but feels it is because she has not eaten much today.  She did try to eat some bread with a little milk this morning and it made her symptoms worse.  She went to work and was given an antacid by her employer which had no improvement in symptoms.  She states the pain increased to an 8-9 out of 10 prompting her to come in for evaluation.  Currently rates her pain as a 7-8 out of 10.  Denies any history of CAD, hypertension, hyperlipidemia.  No smoking history.  Does report history of hepatitis B and prediabetes.  Denies any history of cardiac testing in the past.  No other concerns at this time.   Abdominal Pain   Past Medical History:  Diagnosis Date   Anemia    Environmental allergies    Fibroid     Patient Active Problem List   Diagnosis Date Noted   Fibroids 11/28/2020   Language barrier 07/25/2018   Menorrhagia with regular cycle 11/25/2014   Dysmenorrhea 11/25/2014    Past Surgical History:  Procedure Laterality Date   CESAREAN SECTION     C/S x 2    OB History     Gravida  5   Para  4   Term  4   Preterm  0   AB  1   Living         SAB  1   IAB  0   Ectopic  0   Multiple      Live Births               Home Medications    Prior to Admission medications   Medication Sig Start Date End Date Taking? Authorizing Provider  ferrous sulfate 325 (65 FE) MG EC tablet Take 1 tablet (325 mg total) by mouth 2  (two) times daily. 06/29/20   Ladell Pier, MD  tranexamic acid (LYSTEDA) 650 MG TABS tablet TAKE 2 TABLETS (1,300 MG TOTAL) BY MOUTH 3 (THREE) TIMES DAILY. TAKE DURING MENSES FOR A MAXIMUM OF FIVE DAYS 03/07/21   Aletha Halim, MD    Family History Family History  Problem Relation Age of Onset   Breast cancer Sister     Social History Social History   Tobacco Use   Smoking status: Never   Smokeless tobacco: Never  Vaping Use   Vaping Use: Never used  Substance Use Topics   Alcohol use: No   Drug use: No     Allergies   Patient has no known allergies.   Review of Systems Review of Systems  Gastrointestinal:  Positive for abdominal pain.     Physical Exam Triage Vital Signs ED Triage Vitals  Enc Vitals Group     BP 08/06/22 1659 (!) 139/90     Pulse Rate 08/06/22 1659 75     Resp 08/06/22 1659 16     Temp 08/06/22 1659 98 F (36.7 C)  Temp src --      SpO2 08/06/22 1659 99 %     Weight --      Height --      Head Circumference --      Peak Flow --      Pain Score 08/06/22 1657 7     Pain Loc --      Pain Edu? --      Excl. in [place]? --    No data found.  Updated Vital Signs BP (!) 139/90 (BP Location: Left Arm)   Pulse 75   Temp 98 F (36.7 C)   Resp 16   LMP 07/24/2022   SpO2 99%   Visual Acuity Right Eye Distance:   Left Eye Distance:   Bilateral Distance:    Right Eye Near:   Left Eye Near:    Bilateral Near:     Physical Exam Vitals and nursing note reviewed.  Constitutional:      Appearance: She is well-developed.  HENT:     Head: Normocephalic and atraumatic.  Eyes:     Pupils: Pupils are equal, round, and reactive to light.  Cardiovascular:     Rate and Rhythm: Normal rate and regular rhythm.     Heart sounds: Normal heart sounds.  Pulmonary:     Breath sounds: Normal breath sounds.  Chest:     Chest wall: No tenderness.  Abdominal:     General: Bowel sounds are normal.     Palpations: Abdomen is soft. There is no  hepatomegaly or splenomegaly.     Tenderness: There is abdominal tenderness in the right upper quadrant and epigastric area. There is no right CVA tenderness or left CVA tenderness.     Comments: Abd is obese  Skin:    General: Skin is warm and dry.  Neurological:     General: No focal deficit present.     Mental Status: She is alert and oriented to person, place, and time.  Psychiatric:        Mood and Affect: Mood normal.        Behavior: Behavior normal.      UC Treatments / Results  Labs (all labs ordered are listed, but only abnormal results are displayed) Labs Reviewed  POCT URINALYSIS DIPSTICK, ED / UC - Abnormal; Notable for the following components:      Result Value   Ketones, ur TRACE (*)    All other components within normal limits    EKG SR, HR 69, with no acute ST-T wave abnormalities   Radiology No results found.  Procedures Procedures (including critical care time)  Medications Ordered in UC Medications - No data to display  Initial Impression / Assessment and Plan / UC Course  I have reviewed the triage vital signs and the nursing notes.  Pertinent labs & imaging results that were available during my care of the patient were reviewed by me and considered in my medical decision making (see chart for details).     Discussed at length with patient symptoms and exam. EKG unremarkable Concern for hepatitis vs cholecystitis vs PUD Reviewed limitations and abilities urgent care.  Advised patient go to the emergency room for further evaluation and treatment.  Patient states she wants to go home and go to sleep as she is very tired.  Reiterated with patient need for further evaluation ASAP.  She reluctantly is in agreement will go to [facility] ER with daughter.  She was discharged in stable condition in no  acute distress. Final Clinical Impressions(s) / UC Diagnoses   Final diagnoses:  None   Discharge Instructions   None    ED Prescriptions    None    PDMP not reviewed this encounter.   Melynda Ripple, NP 08/06/22 1819

## 2022-08-06 NOTE — ED Triage Notes (Signed)
Pt is here for abdominal pain since today

## 2022-08-07 NOTE — Telephone Encounter (Signed)
Patient was evaluated at [facility] on yesterday.

## 2022-08-08 ENCOUNTER — Ambulatory Visit: Payer: No Typology Code available for payment source | Admitting: Family Medicine

## 2022-09-19 ENCOUNTER — Encounter: Payer: Self-pay | Admitting: Nurse Practitioner

## 2022-09-19 ENCOUNTER — Ambulatory Visit: Payer: BC Managed Care – PPO | Attending: Family Medicine | Admitting: Nurse Practitioner

## 2022-09-19 VITALS — BP 150/85 | HR 89 | Ht 62.0 in | Wt 253.0 lb

## 2022-09-19 DIAGNOSIS — E6609 Other obesity due to excess calories: Secondary | ICD-10-CM | POA: Diagnosis not present

## 2022-09-19 DIAGNOSIS — Z7689 Persons encountering health services in other specified circumstances: Secondary | ICD-10-CM

## 2022-09-19 DIAGNOSIS — R7303 Prediabetes: Secondary | ICD-10-CM | POA: Diagnosis not present

## 2022-09-19 DIAGNOSIS — Z6841 Body Mass Index (BMI) 40.0 and over, adult: Secondary | ICD-10-CM

## 2022-09-19 DIAGNOSIS — N939 Abnormal uterine and vaginal bleeding, unspecified: Secondary | ICD-10-CM | POA: Diagnosis not present

## 2022-09-19 DIAGNOSIS — Z862 Personal history of diseases of the blood and blood-forming organs and certain disorders involving the immune mechanism: Secondary | ICD-10-CM

## 2022-09-19 DIAGNOSIS — R030 Elevated blood-pressure reading, without diagnosis of hypertension: Secondary | ICD-10-CM

## 2022-09-19 DIAGNOSIS — Z0100 Encounter for examination of eyes and vision without abnormal findings: Secondary | ICD-10-CM

## 2022-09-19 DIAGNOSIS — Z1211 Encounter for screening for malignant neoplasm of colon: Secondary | ICD-10-CM

## 2022-09-19 NOTE — Progress Notes (Signed)
Assessment & Plan:  [name] was seen today for establish care.  Diagnoses and all orders for this visit:  Encounter to establish care  History of anemia -     CBC with Differential -     Iron, TIBC and Ferritin Panel -     Thyroid Panel With TSH  Abnormal uterine bleeding (AUB) -     Ambulatory referral to Gynecology -     Thyroid Panel With TSH  Encounter for complete eye exam -     Ambulatory referral to Ophthalmology  Prediabetes -     CMP14+EGFR -     Hemoglobin A1c -     Thyroid Panel With TSH  Colon cancer screening -     Cologuard  Elevated blood pressure reading DASH DIET EXERCISE 3-5 times per week Will need to start HCTZ if no improvement at next office visit   Patient has been counseled on age-appropriate routine health concerns for screening and prevention. These are reviewed and up-to-date. Referrals have been placed accordingly. Immunizations are up-to-date or declined.    Subjective:   Chief Complaint  Patient presents with   Establish Care   HPI Karen Gibbs 52 y.o. female presents to office today to establish care   She has a history of anemia due to AUB, allergies and uterine fibroid   Notes persistent tickle in throat causing a cough today. She denies any history of asthma or allergies. She is not a smoker. States she made a tea with ginger, honey and rosemary when she had a cough like this in the past and the cough went away. She denies any other URI symptoms.    Anemia She stopped taking iron tablets. Has a history of fibroids and AUB. Was offered a hysterectomy in the past but does not want a permanent surgery.     Blood pressure is elevated today. Work on diet and exercise. If continues elevated will need to start antihypertensive.  BP Readings from Last 3 Encounters:  09/19/22 (!) 150/85  08/06/22 (!) 139/90  11/28/20 134/70      Review of Systems  Constitutional:  Negative for fever, malaise/fatigue and weight loss.  HENT:  Negative.  Negative for nosebleeds.   Eyes: Negative.  Negative for blurred vision, double vision and photophobia.  Respiratory:  Positive for cough. Negative for hemoptysis, sputum production, shortness of breath and wheezing.   Cardiovascular: Negative.  Negative for chest pain, palpitations and leg swelling.  Gastrointestinal: Negative.  Negative for heartburn, nausea and vomiting.  Musculoskeletal: Negative.  Negative for myalgias.  Neurological: Negative.  Negative for dizziness, focal weakness, seizures and headaches.  Psychiatric/Behavioral: Negative.  Negative for suicidal ideas.     Past Medical History:  Diagnosis Date   Anemia    Environmental allergies    Fibroid     Past Surgical History:  Procedure Laterality Date   CESAREAN SECTION     C/S x 2    Family History  Problem Relation Age of Onset   Breast cancer Sister     Social History Reviewed with no changes to be made today.   Outpatient Medications Prior to Visit  Medication Sig Dispense Refill   ferrous sulfate 325 (65 FE) MG EC tablet Take 1 tablet (325 mg total) by mouth 2 (two) times daily. (Patient not taking: Reported on 09/19/2022) 60 tablet 4   tranexamic acid (LYSTEDA) 650 MG TABS tablet TAKE 2 TABLETS (1,300 MG TOTAL) BY MOUTH 3 (THREE) TIMES DAILY. TAKE DURING MENSES FOR  A MAXIMUM OF FIVE DAYS (Patient not taking: Reported on 09/19/2022) 30 tablet 2   No facility-administered medications prior to visit.    No Known Allergies     Objective:    BP (!) 150/85   Pulse 89   Ht '5\' 2"'$  (1.575 m)   Wt 253 lb (114.8 kg)   LMP 08/13/2022 (Exact Date)   SpO2 98%   BMI 46.27 kg/m  Wt Readings from Last 3 Encounters:  09/19/22 253 lb (114.8 kg)  11/28/20 217 lb 14.4 oz (98.8 kg)  10/31/20 214 lb 8 oz (97.3 kg)    Physical Exam Vitals and nursing note reviewed.  Constitutional:      Appearance: She is well-developed.  HENT:     Head: Normocephalic and atraumatic.  Cardiovascular:     Rate and  Rhythm: Normal rate and regular rhythm.     Heart sounds: Normal heart sounds. No murmur heard.    No friction rub. No gallop.  Pulmonary:     Effort: Pulmonary effort is normal. No tachypnea or respiratory distress.     Breath sounds: Normal breath sounds. No decreased breath sounds, wheezing, rhonchi or rales.  Chest:     Chest wall: No tenderness.  Abdominal:     General: Bowel sounds are normal.     Palpations: Abdomen is soft.  Musculoskeletal:        General: Normal range of motion.     Cervical back: Normal range of motion.  Skin:    General: Skin is warm and dry.  Neurological:     Mental Status: She is alert and oriented to person, place, and time.     Coordination: Coordination normal.  Psychiatric:        Behavior: Behavior normal. Behavior is cooperative.        Thought Content: Thought content normal.        Judgment: Judgment normal.          Patient has been counseled extensively about nutrition and exercise as well as the importance of adherence with medications and regular follow-up. The patient was given clear instructions to go to ER or return to medical center if symptoms don't improve, worsen or new problems develop. The patient verbalized understanding.   Follow-up: Return in 2 months (on 11/26/2022) for physical.   Gildardo Pounds, FNP-BC [facility], [place]   09/19/2022, 5:13 PM

## 2022-09-19 NOTE — Progress Notes (Signed)
On set of cough 1/17

## 2022-09-20 ENCOUNTER — Other Ambulatory Visit: Payer: Self-pay | Admitting: Nurse Practitioner

## 2022-09-20 DIAGNOSIS — D500 Iron deficiency anemia secondary to blood loss (chronic): Secondary | ICD-10-CM

## 2022-09-20 LAB — CBC WITH DIFFERENTIAL/PLATELET

## 2022-09-20 MED ORDER — FERROUS SULFATE 325 (65 FE) MG PO TBEC: 325.0000 mg | DELAYED_RELEASE_TABLET | Freq: Two times a day (BID) | ORAL | 4 refills | Status: DC

## 2022-09-24 LAB — CBC WITH DIFFERENTIAL/PLATELET
Basophils Absolute: 0.1 10*3/uL (ref 0.0–0.2)
Basos: 1 %
EOS (ABSOLUTE): 0.2 10*3/uL (ref 0.0–0.4)
Eos: 3 %
Hematocrit: 26.9 % — ABNORMAL LOW (ref 34.0–46.6)
Hemoglobin: 7 g/dL — CL (ref 11.1–15.9)
Immature Grans (Abs): 0 10*3/uL (ref 0.0–0.1)
Immature Granulocytes: 0 %
Lymphocytes Absolute: 3.2 10*3/uL — ABNORMAL HIGH (ref 0.7–3.1)
Lymphs: 45 %
MCH: 14.9 pg — ABNORMAL LOW (ref 26.6–33.0)
MCHC: 26 g/dL — ABNORMAL LOW (ref 31.5–35.7)
MCV: 57 fL — ABNORMAL LOW (ref 79–97)
Monocytes Absolute: 0.7 10*3/uL (ref 0.1–0.9)
Monocytes: 8 %
Neutrophils Absolute: 3.1 10*3/uL (ref 1.4–7.0)
Neutrophils: 43 %
Platelets: 444 10*3/uL (ref 150–450)
RBC: 4.71 x10E6/uL (ref 3.77–5.28)
RDW: 21.8 % — ABNORMAL HIGH (ref 11.7–15.4)
WBC: 7.2 10*3/uL (ref 3.4–10.8)

## 2022-09-24 LAB — CMP14+EGFR
ALT: 10 IU/L (ref 0–32)
AST: 17 IU/L (ref 0–40)
Albumin/Globulin Ratio: 1.4 (ref 1.2–2.2)
Albumin: 3.9 g/dL (ref 3.9–4.9)
Alkaline Phosphatase: 53 IU/L (ref 44–121)
BUN/Creatinine Ratio: 18 (ref 9–23)
BUN: 9 mg/dL (ref 6–24)
Bilirubin Total: 0.4 mg/dL (ref 0.0–1.2)
CO2: 22 mmol/L (ref 20–29)
Calcium: 9.2 mg/dL (ref 8.7–10.2)
Chloride: 104 mmol/L (ref 96–106)
Creatinine, Ser: 0.49 mg/dL — ABNORMAL LOW (ref 0.57–1.00)
Globulin, Total: 2.8 g/dL (ref 1.5–4.5)
Glucose: 85 mg/dL (ref 70–99)
Potassium: 4.2 mmol/L (ref 3.5–5.2)
Sodium: 139 mmol/L (ref 134–144)
Total Protein: 6.7 g/dL (ref 6.0–8.5)
eGFR: 115 mL/min/{1.73_m2} (ref >59)

## 2022-09-24 LAB — IRON,TIBC AND FERRITIN PANEL
Ferritin: 5 ng/mL — ABNORMAL LOW (ref 15–150)
Iron Saturation: 2 % — CL (ref 15–55)
Iron: 12 ug/dL — ABNORMAL LOW (ref 27–159)
Total Iron Binding Capacity: 536 ug/dL — ABNORMAL HIGH (ref 250–450)
UIBC: 524 ug/dL — ABNORMAL HIGH (ref 131–425)

## 2022-09-24 LAB — THYROID PANEL WITH TSH
Free Thyroxine Index: 1.1 — ABNORMAL LOW (ref 1.2–4.9)
T3 Uptake Ratio: 23 % — ABNORMAL LOW (ref 24–39)
T4, Total: 4.7 ug/dL (ref 4.5–12.0)
TSH: 1.92 u[IU]/mL (ref 0.450–4.500)

## 2022-09-24 LAB — HEMOGLOBIN A1C
Est. average glucose Bld gHb Est-mCnc: 114 mg/dL
Hgb A1c MFr Bld: 5.6 % (ref 4.8–5.6)

## 2022-10-02 ENCOUNTER — Telehealth: Payer: Self-pay | Admitting: *Deleted

## 2022-10-02 NOTE — Telephone Encounter (Signed)
Patient has iron pills- but has not heard anything from infusion- she would to get call back regarding scheduling. May contact her on home number.

## 2022-10-18 ENCOUNTER — Ambulatory Visit: Payer: BC Managed Care – PPO | Admitting: Nurse Practitioner

## 2022-10-18 ENCOUNTER — Encounter: Payer: Self-pay | Admitting: Nurse Practitioner

## 2022-10-18 ENCOUNTER — Ambulatory Visit: Payer: Self-pay | Admitting: *Deleted

## 2022-10-18 ENCOUNTER — Telehealth: Payer: Self-pay

## 2022-10-18 VITALS — BP 126/78 | HR 89 | Ht 61.0 in | Wt 207.0 lb

## 2022-10-18 DIAGNOSIS — D500 Iron deficiency anemia secondary to blood loss (chronic): Secondary | ICD-10-CM

## 2022-10-18 DIAGNOSIS — Z30013 Encounter for initial prescription of injectable contraceptive: Secondary | ICD-10-CM | POA: Diagnosis not present

## 2022-10-18 DIAGNOSIS — N920 Excessive and frequent menstruation with regular cycle: Secondary | ICD-10-CM

## 2022-10-18 DIAGNOSIS — D219 Benign neoplasm of connective and other soft tissue, unspecified: Secondary | ICD-10-CM

## 2022-10-18 MED ORDER — MEDROXYPROGESTERONE ACETATE 150 MG/ML IM SUSP
150.0000 mg | Freq: Once | INTRAMUSCULAR | Status: AC
  Administered 2022-10-18: 150 mg via INTRAMUSCULAR

## 2022-10-18 NOTE — Telephone Encounter (Signed)
Summary: cough   Pt has a bad cough and she was advised to let the provider know if her cough continues or worsens so she can get an Rx sent to the pharmacy for her cough /[id number]- [place], [place] - [address]/ pts cough has not gotten any better / please advise       Chief Complaint: productive cough  Symptoms: cough worsening since 09/19/22. Now productive yellow sputum. Runny nose  shortness of breath with coughing . Rash to ears and neck due to coughing causes itching .  Frequency: cough since 09/19/22 and yellow sputum since 2 days ago  Pertinent Negatives: Patient denies chest pain no fever. Disposition: []$ ED /[x]$ Urgent Care (no appt availability in office) / []$ Appointment(In office/virtual)/ []$  [facility]/ []$ Home Care/ []$ Refused Recommended Disposition /[x]$ [facility]/ []$  Follow-up with PCP Additional Notes:   Requesting medication told to call back if cough not better. Recommended mobile bus but late in the afternoon and may not make it. Recommended UC. Patient would like PCP notified please advise.       Reason for Disposition  Cough has been present for > 3 weeks  Answer Assessment - Initial Assessment Questions 1. ONSET: "When did the cough begin?"      Since 09/19/22 2. SEVERITY: "How bad is the cough today?"      Continues to interfere with sleep and work not getting better 3. SPUTUM: "Describe the color of your sputum" (none, dry cough; clear, white, yellow, green)     Yellow  4. HEMOPTYSIS: "Are you coughing up any blood?" If so ask: "How much?" (flecks, streaks, tablespoons, etc.)     na 5. DIFFICULTY BREATHING: "Are you having difficulty breathing?" If Yes, ask: "How bad is it?" (e.g., mild, moderate, severe)    - MILD: No SOB at rest, mild SOB with walking, speaks normally in sentences, can lie down, no retractions, pulse < 100.    - MODERATE: SOB at rest, SOB with minimal exertion and  prefers to sit, cannot lie down flat, speaks in phrases, mild retractions, audible wheezing, pulse 100-120.    - SEVERE: Very SOB at rest, speaks in single words, struggling to breathe, sitting hunched forward, retractions, pulse > 120      Shortness of breath at rest 6. FEVER: "Do you have a fever?" If Yes, ask: "What is your temperature, how was it measured, and when did it start?"     na 7. CARDIAC HISTORY: "Do you have any history of heart disease?" (e.g., heart attack, congestive heart failure)      na 8. LUNG HISTORY: "Do you have any history of lung disease?"  (e.g., pulmonary embolus, asthma, emphysema)     na 9. PE RISK FACTORS: "Do you have a history of blood clots?" (or: recent major surgery, recent prolonged travel, bedridden)     na 10. OTHER SYMPTOMS: "Do you have any other symptoms?" (e.g., runny nose, wheezing, chest pain)       Cough yellow mucus shortness of breath at rest, runny nose  11. PREGNANCY: "Is there any chance you are pregnant?" "When was your last menstrual period?"       na 12. TRAVEL: "Have you traveled out of the country in the last month?" (e.g., travel history, exposures)       na  Protocols used: Cough - Acute Productive-A-AH

## 2022-10-18 NOTE — Telephone Encounter (Signed)
Yeah that may be the best option.       Previous Messages    ----- Message ----- From: Ramond Craver, RMA Sent: 10/18/2022   3:01 PM EST To: Tamela Gammon, NP Subject: RE: Iron transfusion                          We can place urgent order for hematology referral. Will that work? ----- Message ----- From: Tamela Gammon, NP Sent: 10/18/2022   3:00 PM EST To: [facility] Triage Subject: Iron transfusion                              Iron transfusions recommended by PCP in January but patient reports not hearing from anyone about getting this scheduled. Not sure how we can expedite this for her? She may be a surgical candidate for [place] and we cannot proceed until levels are better.

## 2022-10-18 NOTE — Telephone Encounter (Signed)
Ambulatory referral order placed to hematology.

## 2022-10-18 NOTE — Progress Notes (Signed)
   Acute Office Visit  Subjective:    Patient ID: Karen Gibbs, female    DOB: Jan 03, 1971, 52 y.o.   MRN: [id number]   HPI 52 y.o. [id number] presents today for abnormal uterine bleeding. Has been evaluated for this in the past. Hysterectomy recommended in 2022 after failed treatments but she did not have insurance at the time. Also declined EBX. Neg EBX in 2020. Treatments tried include Lysteda, Aygestin, IUD.  IUD fell out. Menses are mostly monthly, may be off by a week sometimes. Normal pap history, most recent in 2019. U/S 10/2020 showed small fibroids, largest measuring 2.1 cm. H/O iron deficiency anemia, PCP referred her for iron transfusions but she has not heard back. Does not tolerate PO iron.    Review of Systems  Constitutional:  Positive for fatigue.  Genitourinary:  Positive for menstrual problem.       Objective:    Physical Exam Constitutional:      Appearance: Normal appearance. She is obese.  Genitourinary:    General: Normal vulva.     Vagina: Normal.     Cervix: Normal.     Uterus: Enlarged. Not tender.      BP 126/78 (BP Location: Right Arm, Patient Position: Sitting, Cuff Size: Large)   Pulse 89   Ht '5\' 1"'$  (1.549 m)   Wt 207 lb (93.9 kg)   LMP 08/29/2022 (Exact Date)   SpO2 99%   BMI 39.11 kg/m  Wt Readings from Last 3 Encounters:  10/18/22 207 lb (93.9 kg)  09/19/22 253 lb (114.8 kg)  11/28/20 217 lb 14.4 oz (98.8 kg)        Patient informed chaperone available to be present for breast and/or pelvic exam. Patient has requested no chaperone to be present. Patient has been advised what will be completed during breast and pelvic exam.   Assessment & Plan:   Problem List Items Addressed This Visit       Other   Menorrhagia with regular cycle   Relevant Orders   US PELVIS TRANSVAGINAL NON-OB (TV ONLY)   Fibroids   Relevant Orders   US PELVIS TRANSVAGINAL NON-OB (TV ONLY)   Other Visit Diagnoses     Encounter for initial prescription of  injectable contraceptive    -  Primary   Relevant Medications   medroxyPROGESTERone (DEPO-PROVERA) injection 150 mg (Completed)      Plan: Limited in medication management due to past failed treatments. Discussed surgical options of D&C with possible myomectomy or hysterectomy. She is considering. Will repeat ultrasound. Depo today. Will call to help organize iron transfusions. All questions answered.      [place], 4:18 PM 10/18/2022

## 2022-10-19 ENCOUNTER — Telehealth: Payer: Self-pay | Admitting: Hematology and Oncology

## 2022-10-19 NOTE — Telephone Encounter (Signed)
Scheduled appt per 2/15 referral. Pt is aware of appt date and time. Pt is aware to arrive 15 mins prior to appt time and to bring and updated insurance card. Pt is aware of appt location.   °

## 2022-10-22 ENCOUNTER — Other Ambulatory Visit: Payer: Self-pay | Admitting: Nurse Practitioner

## 2022-10-22 DIAGNOSIS — R059 Cough, unspecified: Secondary | ICD-10-CM

## 2022-10-22 DIAGNOSIS — J069 Acute upper respiratory infection, unspecified: Secondary | ICD-10-CM

## 2022-10-22 MED ORDER — PSEUDOEPH-BROMPHEN-DM 30-2-10 MG/5ML PO SYRP: 5.0000 mL | ORAL_SOLUTION | Freq: Four times a day (QID) | ORAL | 0 refills | Status: DC | PRN

## 2022-10-22 MED ORDER — PREDNISONE 20 MG PO TABS: 20.0000 mg | ORAL_TABLET | Freq: Every day | ORAL | 0 refills | Status: AC

## 2022-10-22 NOTE — Telephone Encounter (Signed)
Hematology appt scheduled 11/06/22 at 3pm.

## 2022-10-22 NOTE — Telephone Encounter (Signed)
Patient is still having cough and requesting medication.

## 2022-10-22 NOTE — Telephone Encounter (Signed)
Medications sent

## 2022-10-22 NOTE — Telephone Encounter (Signed)
Great, thank you!

## 2022-10-23 NOTE — Telephone Encounter (Signed)
Unable to reach patient by phone to relay results.  Message sent through [place].

## 2022-11-06 ENCOUNTER — Inpatient Hospital Stay: Payer: BC Managed Care – PPO | Admitting: Hematology and Oncology

## 2022-11-06 ENCOUNTER — Telehealth: Payer: Self-pay | Admitting: Hematology and Oncology

## 2022-11-06 ENCOUNTER — Inpatient Hospital Stay: Payer: BC Managed Care – PPO

## 2022-11-06 NOTE — Telephone Encounter (Signed)
Spoke with patient regarding upcoming appointment change

## 2022-11-14 ENCOUNTER — Ambulatory Visit (HOSPITAL_COMMUNITY)
Admission: EM | Admit: 2022-11-14 | Discharge: 2022-11-14 | Disposition: A | Discharge status: Home / Self Care | Payer: BC Managed Care – PPO | Attending: Physician Assistant | Admitting: Physician Assistant

## 2022-11-14 ENCOUNTER — Encounter (HOSPITAL_COMMUNITY): Payer: Self-pay

## 2022-11-14 ENCOUNTER — Ambulatory Visit (INDEPENDENT_AMBULATORY_CARE_PROVIDER_SITE_OTHER): Payer: BC Managed Care – PPO

## 2022-11-14 DIAGNOSIS — R059 Cough, unspecified: Secondary | ICD-10-CM

## 2022-11-14 DIAGNOSIS — R0602 Shortness of breath: Secondary | ICD-10-CM | POA: Diagnosis not present

## 2022-11-14 DIAGNOSIS — J4521 Mild intermittent asthma with (acute) exacerbation: Secondary | ICD-10-CM

## 2022-11-14 MED ORDER — METHYLPREDNISOLONE SODIUM SUCC 125 MG IJ SOLR
INTRAMUSCULAR | Status: AC
  Filled 2022-11-14: qty 2

## 2022-11-14 MED ORDER — ALBUTEROL SULFATE HFA 108 (90 BASE) MCG/ACT IN AERS: 1.0000 | INHALATION_SPRAY | Freq: Four times a day (QID) | RESPIRATORY_TRACT | 0 refills | Status: DC | PRN

## 2022-11-14 MED ORDER — IPRATROPIUM-ALBUTEROL 0.5-2.5 (3) MG/3ML IN SOLN
3.0000 mL | Freq: Once | RESPIRATORY_TRACT | Status: AC
  Administered 2022-11-14: 3 mL via RESPIRATORY_TRACT

## 2022-11-14 MED ORDER — METHYLPREDNISOLONE SODIUM SUCC 125 MG IJ SOLR
80.0000 mg | Freq: Once | INTRAMUSCULAR | Status: AC
  Administered 2022-11-14: 80 mg via INTRAMUSCULAR

## 2022-11-14 MED ORDER — BUDESONIDE-FORMOTEROL FUMARATE 80-4.5 MCG/ACT IN AERO: 2.0000 | INHALATION_SPRAY | Freq: Two times a day (BID) | RESPIRATORY_TRACT | 1 refills | Status: DC

## 2022-11-14 MED ORDER — IPRATROPIUM-ALBUTEROL 0.5-2.5 (3) MG/3ML IN SOLN
RESPIRATORY_TRACT | Status: AC
  Filled 2022-11-14: qty 3

## 2022-11-14 MED ORDER — PREDNISONE 10 MG (21) PO TBPK: ORAL_TABLET | ORAL | 0 refills | Status: DC

## 2022-11-14 NOTE — Discharge Instructions (Signed)
Your x-ray did not show any evidence of pneumonia.  I am glad you are feeling better after the medication.  Use albuterol every 4-6 hours as needed for shortness of breath and coughing fits.  Start Symbicort twice daily.  Rinse your mouth following use of this medication to prevent thrush.  Start prednisone tomorrow (11/15/2022).  Do not take NSAIDs with this medication.  Follow-up with your primary care as you may need to see a specialist.  If anything worsens you have worsening shortness of breath, coughing/wheezing despite medication, fever, chest pain you need to be seen immediately.

## 2022-11-14 NOTE — ED Triage Notes (Signed)
Onset today pt having labored breathing and SOB. Productive cough with yellow mucus. No history of breathing issues.

## 2022-11-14 NOTE — ED Provider Notes (Signed)
[place] [place]    CSN: [id number] Arrival date & time: 11/14/22  1919      History   Chief Complaint Chief Complaint  Patient presents with   Shortness of Breath    HPI Karen Gibbs is a 52 y.o. female.   Patient presents today with a several hour history of recurrent shortness of breath, chest tightness, cough.  Reports that she was treated several weeks ago with a prednisone burst which provided relief of symptoms.  She denies any formal diagnosis of asthma, COPD.  She does not smoke.  She does report feeling much better after the steroids.  Denies any recent antibiotics.  She reports that the cough is productive with thick sputum.  She denies any known sick contacts.  She is confident that she is not pregnant.  She does report some chest discomfort which is rated 8 on a 0-10 pain scale, described as tightness/pressure, no aggravating or alleviating factors identified.  She denies any risk factors for VTE event including exogenous hormone use, history of malignancy, recent travel, immobilization, surgical procedure.    Past Medical History:  Diagnosis Date   Anemia    Environmental allergies    Fibroid     Patient Active Problem List   Diagnosis Date Noted   Fibroids 11/28/2020   Language barrier 07/25/2018   Menorrhagia with regular cycle 11/25/2014   Dysmenorrhea 11/25/2014    Past Surgical History:  Procedure Laterality Date   CESAREAN SECTION     C/S x 2    OB History     Gravida  5   Para  4   Term  4   Preterm  0   AB  1   Living  4      SAB  1   IAB  0   Ectopic  0   Multiple      Live Births  4            Home Medications    Prior to Admission medications   Medication Sig Start Date End Date Taking? Authorizing Provider  albuterol (VENTOLIN HFA) 108 (90 Base) MCG/ACT inhaler Inhale 1-2 puffs into the lungs every 6 (six) hours as needed for wheezing or shortness of breath. 11/14/22  Yes Mallissa Lorenzen, Junie Panning K, PA-C   budesonide-formoterol (SYMBICORT) 80-4.5 MCG/ACT inhaler Inhale 2 puffs into the lungs in the morning and at bedtime. 11/14/22  Yes Sarahjane Matherly K, PA-C  ferrous sulfate 325 (65 FE) MG EC tablet Take 1 tablet (325 mg total) by mouth 2 (two) times daily. 09/20/22  Yes Gildardo Pounds, NP  predniSONE (STERAPRED UNI-PAK 21 TAB) 10 MG (21) TBPK tablet As directed 11/14/22  Yes Jeanell Mangan, Derry Skill, PA-C    Family History Family History  Problem Relation Age of Onset   Breast cancer Sister     Social History Social History   Tobacco Use   Smoking status: Never   Smokeless tobacco: Never  Vaping Use   Vaping Use: Never used  Substance Use Topics   Alcohol use: No   Drug use: No     Allergies   Patient has no known allergies.   Review of Systems Review of Systems  Constitutional:  Positive for activity change. Negative for appetite change, fatigue and fever.  HENT:  Positive for congestion. Negative for sinus pressure, sneezing and sore throat.   Respiratory:  Positive for cough, chest tightness, shortness of breath and wheezing.   Cardiovascular:  Positive for chest pain.  Gastrointestinal:  Negative for abdominal pain, diarrhea, nausea and vomiting.  Neurological:  Negative for dizziness, light-headedness and headaches.     Physical Exam Triage Vital Signs ED Triage Vitals  Enc Vitals Group     BP 11/14/22 1928 (!) 155/75     Pulse Rate 11/14/22 1928 93     Resp 11/14/22 1928 (!) 22     Temp 11/14/22 1928 98.3 F (36.8 C)     Temp Source 11/14/22 1928 Oral     SpO2 11/14/22 1928 99 %     Weight --      Height 11/14/22 1927 '5\' 2"'$  (1.575 m)     Head Circumference --      Peak Flow --      Pain Score 11/14/22 1926 8     Pain Loc --      Pain Edu? --      Excl. in [place]? --    No data found.  Updated Vital Signs BP (!) 155/75 (BP Location: Right Arm)   Pulse 93   Temp 98.3 F (36.8 C) (Oral)   Resp (!) 22   Ht '5\' 2"'$  (1.575 m)   LMP 11/09/2022 (Approximate)   SpO2  99%   BMI 37.86 kg/m   Visual Acuity Right Eye Distance:   Left Eye Distance:   Bilateral Distance:    Right Eye Near:   Left Eye Near:    Bilateral Near:     Physical Exam Vitals reviewed.  Constitutional:      General: She is awake. She is not in acute distress.    Appearance: Normal appearance. She is well-developed. She is not ill-appearing.     Comments: Very pleasant female appears stated age in no acute distress sitting comfortably in exam room  HENT:     Head: Normocephalic and atraumatic.     Right Ear: External ear normal.     Left Ear: External ear normal.     Nose:     Right Sinus: No maxillary sinus tenderness or frontal sinus tenderness.     Left Sinus: No maxillary sinus tenderness or frontal sinus tenderness.     Mouth/Throat:     Pharynx: Uvula midline. No oropharyngeal exudate or posterior oropharyngeal erythema.  Cardiovascular:     Rate and Rhythm: Normal rate and regular rhythm.     Heart sounds: Normal heart sounds, S1 normal and S2 normal. No murmur heard. Pulmonary:     Effort: Pulmonary effort is normal.     Breath sounds: Wheezing and rhonchi present. No rales.     Comments: Widespread wheezing / rhonchi Chest:     Chest wall: Tenderness present. No deformity or swelling.     Comments: Pain reproducible on exam. Psychiatric:        Behavior: Behavior is cooperative.      UC Treatments / Results  Labs (all labs ordered are listed, but only abnormal results are displayed) Labs Reviewed - No data to display  EKG   Radiology DG Chest 2 View  Result Date: 11/14/2022 CLINICAL DATA:  Cough, short of breath EXAM: CHEST - 2 VIEW COMPARISON:  12/16/2014 FINDINGS: Frontal and lateral views of the chest demonstrate a stable cardiac silhouette allowing for differences in positioning and technique. No airspace disease, effusion, or pneumothorax. No acute bony abnormalities. IMPRESSION: 1. No acute intrathoracic process. Electronically Signed   By:  Randa Ngo M.D.   On: 11/14/2022 20:03    Procedures Procedures (including critical care time)  Medications Ordered  in UC Medications  ipratropium-albuterol (DUONEB) 0.5-2.5 (3) MG/3ML nebulizer solution 3 mL (3 mLs Nebulization Given 11/14/22 1940)  methylPREDNISolone sodium succinate (SOLU-MEDROL) 125 mg/2 mL injection 80 mg (80 mg Intramuscular Given 11/14/22 1940)    Initial Impression / Assessment and Plan / UC Course  I have reviewed the triage vital signs and the nursing notes.  Pertinent labs & imaging results that were available during my care of the patient were reviewed by me and considered in my medical decision making (see chart for details).     Patient was initially slightly tachypneic but this improved following DuoNeb.  She was otherwise well-appearing, afebrile, nontoxic.  She did have significant wheezing and rhonchi that resolved with 80 mg of Solu-Medrol in clinic and DuoNeb.  Reports that her chest tightness also resolved with medication.  She denies formal diagnosis of asthma but is open to treatment given her recurrent symptoms.  Chest x-ray was obtained that showed no acute cardiopulmonary disease.  She was given a prescription for albuterol inhaler with instruction how to use this medication.  Will also start Symbicort as maintenance we discussed that she should rinse her mouth following use of this medication to prevent thrush.  Will start prednisone taper.  Discussed that she is not to take NSAIDs with this medication due to risk of GI bleeding.  If she has any worsening symptoms including chest pain, shortness of breath, wheezing despite medication she is to be seen immediately.  Strict return precautions given.  Work excuse note provided.  Final Clinical Impressions(s) / UC Diagnoses   Final diagnoses:  Mild intermittent asthma with exacerbation     Discharge Instructions      Your x-ray did not show any evidence of pneumonia.  I am glad you are feeling  better after the medication.  Use albuterol every 4-6 hours as needed for shortness of breath and coughing fits.  Start Symbicort twice daily.  Rinse your mouth following use of this medication to prevent thrush.  Start prednisone tomorrow (11/15/2022).  Do not take NSAIDs with this medication.  Follow-up with your primary care as you may need to see a specialist.  If anything worsens you have worsening shortness of breath, coughing/wheezing despite medication, fever, chest pain you need to be seen immediately.     ED Prescriptions     Medication Sig Dispense Auth. Provider   albuterol (VENTOLIN HFA) 108 (90 Base) MCG/ACT inhaler Inhale 1-2 puffs into the lungs every 6 (six) hours as needed for wheezing or shortness of breath. 8 g Gay Rape K, PA-C   predniSONE (STERAPRED UNI-PAK 21 TAB) 10 MG (21) TBPK tablet As directed 21 tablet Tilton Marsalis K, PA-C   budesonide-formoterol (SYMBICORT) 80-4.5 MCG/ACT inhaler Inhale 2 puffs into the lungs in the morning and at bedtime. 1 each Aneliz Carbary, Derry Skill, PA-C      PDMP not reviewed this encounter.   Terrilee Croak, Hershal Coria 11/14/22 2033

## 2022-11-21 ENCOUNTER — Inpatient Hospital Stay: Payer: BC Managed Care – PPO | Attending: Hematology and Oncology | Admitting: Hematology and Oncology

## 2022-11-21 ENCOUNTER — Inpatient Hospital Stay: Payer: BC Managed Care – PPO

## 2022-11-21 ENCOUNTER — Other Ambulatory Visit: Payer: Self-pay

## 2022-11-21 VITALS — BP 155/96 | HR 82 | Temp 98.1°F | Resp 16 | Ht 62.0 in | Wt 206.7 lb

## 2022-11-21 DIAGNOSIS — D509 Iron deficiency anemia, unspecified: Secondary | ICD-10-CM | POA: Insufficient documentation

## 2022-11-21 DIAGNOSIS — N920 Excessive and frequent menstruation with regular cycle: Secondary | ICD-10-CM | POA: Diagnosis not present

## 2022-11-21 DIAGNOSIS — D500 Iron deficiency anemia secondary to blood loss (chronic): Secondary | ICD-10-CM

## 2022-11-21 DIAGNOSIS — Z803 Family history of malignant neoplasm of breast: Secondary | ICD-10-CM | POA: Diagnosis not present

## 2022-11-21 LAB — CBC WITH DIFFERENTIAL/PLATELET
Abs Immature Granulocytes: 0.03 10*3/uL (ref 0.00–0.07)
Basophils Absolute: 0 10*3/uL (ref 0.0–0.1)
Basophils Relative: 0 %
Eosinophils Absolute: 0.1 10*3/uL (ref 0.0–0.5)
Eosinophils Relative: 1 %
HCT: 33.4 % — ABNORMAL LOW (ref 36.0–46.0)
Hemoglobin: 9.6 g/dL — ABNORMAL LOW (ref 12.0–15.0)
Immature Granulocytes: 0 %
Lymphocytes Relative: 24 %
Lymphs Abs: 2.2 10*3/uL (ref 0.7–4.0)
MCH: 17.9 pg — ABNORMAL LOW (ref 26.0–34.0)
MCHC: 28.7 g/dL — ABNORMAL LOW (ref 30.0–36.0)
MCV: 62.3 fL — ABNORMAL LOW (ref 80.0–100.0)
Monocytes Absolute: 0.4 10*3/uL (ref 0.1–1.0)
Monocytes Relative: 5 %
Neutro Abs: 6.5 10*3/uL (ref 1.7–7.7)
Neutrophils Relative %: 70 %
Platelets: 335 10*3/uL (ref 150–400)
RBC: 5.36 MIL/uL — ABNORMAL HIGH (ref 3.87–5.11)
RDW: 28.2 % — ABNORMAL HIGH (ref 11.5–15.5)
WBC: 9.3 10*3/uL (ref 4.0–10.5)
nRBC: 0 % (ref 0.0–0.2)

## 2022-11-21 LAB — IRON AND IRON BINDING CAPACITY (CC-WL,HP ONLY)
Iron: 32 ug/dL (ref 28–170)
Saturation Ratios: 6 % — ABNORMAL LOW (ref 10.4–31.8)
TIBC: 575 ug/dL — ABNORMAL HIGH (ref 250–450)
UIBC: 543 ug/dL

## 2022-11-21 NOTE — Progress Notes (Unsigned)
[facility] NOTE  Patient Care Team: Gildardo Pounds, NP as PCP - General (Nurse Practitioner)  CHIEF COMPLAINTS/PURPOSE OF CONSULTATION:  IDA  ASSESSMENT & PLAN:  No problem-specific Assessment & Plan notes found for this encounter.  No orders of the defined types were placed in this encounter.    HISTORY OF PRESENTING ILLNESS:  Karen Gibbs 52 y.o. female is here because of severe IDA.    REVIEW OF SYSTEMS:   Constitutional: Denies fevers, chills or abnormal night sweats Eyes: Denies blurriness of vision, double vision or watery eyes Ears, nose, mouth, throat, and face: Denies mucositis or sore throat Respiratory: Denies cough, dyspnea or wheezes Cardiovascular: Denies palpitation, chest discomfort or lower extremity swelling Gastrointestinal:  Denies nausea, heartburn or change in bowel habits Skin: Denies abnormal skin rashes Lymphatics: Denies new lymphadenopathy or easy bruising Neurological:Denies numbness, tingling or new weaknesses Behavioral/Psych: Mood is stable, no new changes  All other systems were reviewed with the patient and are negative.  MEDICAL HISTORY:  Past Medical History:  Diagnosis Date   Anemia    Environmental allergies    Fibroid     SURGICAL HISTORY: Past Surgical History:  Procedure Laterality Date   CESAREAN SECTION     C/S x 2    SOCIAL HISTORY: Social History   Socioeconomic History   Marital status: Married    Spouse name: Not on file   Number of children: Not on file   Years of education: Not on file   Highest education level: Not on file  Occupational History   Not on file  Tobacco Use   Smoking status: Never   Smokeless tobacco: Never  Vaping Use   Vaping Use: Never used  Substance and Sexual Activity   Alcohol use: No   Drug use: No   Sexual activity: Yes    Birth control/protection: None  Other Topics Concern   Not on file  Social History Narrative   ** Merged History Encounter **        ** Merged History Encounter **       Social Determinants of Health   Financial Resource Strain: Not on file  Food Insecurity: No Food Insecurity (11/28/2020)   Hunger Vital Sign    Worried About Running Out of Food in the Last Year: Never true    Ran Out of Food in the Last Year: Never true  Transportation Needs: No Transportation Needs (11/28/2020)   PRAPARE - Hydrologist (Medical): No    Lack of Transportation (Non-Medical): No  Physical Activity: Not on file  Stress: Not on file  Social Connections: Not on file  Intimate Partner Violence: Not on file    FAMILY HISTORY: Family History  Problem Relation Age of Onset   Breast cancer Sister     ALLERGIES:  has No Known Allergies.  MEDICATIONS:  Current Outpatient Medications  Medication Sig Dispense Refill   albuterol (VENTOLIN HFA) 108 (90 Base) MCG/ACT inhaler Inhale 1-2 puffs into the lungs every 6 (six) hours as needed for wheezing or shortness of breath. 8 g 0   budesonide-formoterol (SYMBICORT) 80-4.5 MCG/ACT inhaler Inhale 2 puffs into the lungs in the morning and at bedtime. 1 each 1   ferrous sulfate 325 (65 FE) MG EC tablet Take 1 tablet (325 mg total) by mouth 2 (two) times daily. 60 tablet 4   predniSONE (STERAPRED UNI-PAK 21 TAB) 10 MG (21) TBPK tablet As directed 21 tablet 0  No current facility-administered medications for this visit.     PHYSICAL EXAMINATION: ECOG PERFORMANCE STATUS: 0 - Asymptomatic  Vitals:   11/21/22 1409  BP: (!) 155/96  Pulse: 82  Resp: 16  Temp: 98.1 F (36.7 C)  SpO2: 100%   Filed Weights   11/21/22 1409  Weight: 206 lb 11.2 oz (93.8 kg)    GENERAL:alert, no distress and comfortable SKIN: skin color, texture, turgor are normal, no rashes or significant lesions EYES: normal, conjunctiva are pink and non-injected, sclera clear OROPHARYNX:no exudate, no erythema and lips, buccal mucosa, and tongue normal  NECK: supple, thyroid normal  size, non-tender, without nodularity LYMPH:  no palpable lymphadenopathy in the cervical, axillary or inguinal LUNGS: clear to auscultation and percussion with normal breathing effort HEART: regular rate & rhythm and no murmurs and no lower extremity edema ABDOMEN:abdomen soft, non-tender and normal bowel sounds Musculoskeletal:no cyanosis of digits and no clubbing  PSYCH: alert & oriented x 3 with fluent speech NEURO: no focal motor/sensory deficits  LABORATORY DATA:  I have reviewed the data as listed Lab Results  Component Value Date   WBC 7.2 09/19/2022   HGB 7.0 (LL) 09/19/2022   HCT 26.9 (L) 09/19/2022   MCV 57 (L) 09/19/2022   PLT 444 09/19/2022     Chemistry      Component Value Date/Time   NA 139 09/19/2022 1637   K 4.2 09/19/2022 1637   CL 104 09/19/2022 1637   CO2 22 09/19/2022 1637   BUN 9 09/19/2022 1637   CREATININE 0.49 (L) 09/19/2022 1637   CREATININE 0.50 10/08/2014 1256      Component Value Date/Time   CALCIUM 9.2 09/19/2022 1637   ALKPHOS 53 09/19/2022 1637   AST 17 09/19/2022 1637   ALT 10 09/19/2022 1637   BILITOT 0.4 09/19/2022 1637       RADIOGRAPHIC STUDIES: I have personally reviewed the radiological images as listed and agreed with the findings in the report. DG Chest 2 View  Result Date: 11/14/2022 CLINICAL DATA:  Cough, short of breath EXAM: CHEST - 2 VIEW COMPARISON:  12/16/2014 FINDINGS: Frontal and lateral views of the chest demonstrate a stable cardiac silhouette allowing for differences in positioning and technique. No airspace disease, effusion, or pneumothorax. No acute bony abnormalities. IMPRESSION: 1. No acute intrathoracic process. Electronically Signed   By: Randa Ngo M.D.   On: 11/14/2022 20:03    All questions were answered. The patient knows to call the clinic with any problems, questions or concerns. I spent 45 minutes in the care of this patient including H and P, review of records, counseling and coordination of  care.     Benay Pike, MD 11/21/2022 2:13 PM

## 2022-11-22 ENCOUNTER — Encounter: Payer: Self-pay | Admitting: Hematology and Oncology

## 2022-11-22 LAB — FERRITIN: Ferritin: 6 ng/mL — ABNORMAL LOW (ref 11–307)

## 2022-11-22 NOTE — Assessment & Plan Note (Signed)
This is a very pleasant 52 year old female patient with newly diagnosed iron deficiency anemia from chronic menstrual blood loss referred to hematology for recommendations.  We have discussed the following details about iron deficiency anemia today.  Iron deficiency anemia affects a large proportion of the wellness population especially females of childbearing age and children.  Common causes of iron deficiency include blood loss, reduced iron absorption because of previous surgeries, dietary restrictions or other malabsorption issues, medications that reduce gastric acidity are due to inherited disorders such as IRIDA due to TMPRSS6 mutation. Symptoms of iron deficiency usually include fatigue, pica, restless legs, exercise intolerance, exertional dyspnea, headaches and weakness. Diagnosis can be usually made by history, physical examination, CBC and iron studies.  We generally treat iron deficiency anemia with oral supplements if this can be tolerated.  IV iron is appropriate for patients are unable to tolerate due to gastrointestinal side effects or for patients with severe/ongoing blood loss, history of gastric bypass which reduces gastric acid and henceforth will impair intestinal absorption of oral iron, malabsorption syndromes are occasional in pregnancy. There are several formularies of intravenous iron available in the market.  We have discussed about risk of allergic/infusion reactions including potentially life-threatening anaphylaxis with intravenous iron however these serious allergic reactions are exceedingly rare and overestimated.  In contrast to serious allergic reactions, IV iron may be associated with nonallergic infusion reactions including self-limited urticaria, palpitations, dizziness, neck and back spasm which again occur in less than 1% of the individuals and do not progress to more serious reactions.   She is willing to proceed with IV iron, Venofer 300 mg x 3 infusions orders have  been placed.  She was once again encouraged to continue follow-up with OB/GYN for further management of menorrhagia which may lead to recurrent blood loss.  She will return to clinic in a few months for follow-up.  Labs have been ordered for today to see if she had any improvement with oral iron replacement.

## 2022-11-26 ENCOUNTER — Encounter: Payer: Self-pay | Admitting: Nurse Practitioner

## 2022-11-26 ENCOUNTER — Ambulatory Visit: Payer: BC Managed Care – PPO | Attending: Nurse Practitioner | Admitting: Nurse Practitioner

## 2022-11-26 VITALS — BP 153/86 | HR 85 | Ht 62.0 in | Wt 209.0 lb

## 2022-11-26 DIAGNOSIS — D500 Iron deficiency anemia secondary to blood loss (chronic): Secondary | ICD-10-CM

## 2022-11-26 DIAGNOSIS — N939 Abnormal uterine and vaginal bleeding, unspecified: Secondary | ICD-10-CM | POA: Diagnosis not present

## 2022-11-26 DIAGNOSIS — Z0000 Encounter for general adult medical examination without abnormal findings: Secondary | ICD-10-CM | POA: Diagnosis not present

## 2022-11-26 DIAGNOSIS — Z1211 Encounter for screening for malignant neoplasm of colon: Secondary | ICD-10-CM

## 2022-11-26 NOTE — Progress Notes (Signed)
Assessment & Plan:  Karen Gibbs was seen today for annual exam.  Diagnoses and all orders for this visit:  Encounter for annual physical exam  Abnormal uterine bleeding (AUB) -     Ambulatory referral to Gynecology  Iron deficiency anemia due to chronic blood loss -     Ambulatory referral to Gynecology  Colon cancer screening -     Ambulatory referral to Gastroenterology    Patient has been counseled on age-appropriate routine health concerns for screening and prevention. These are reviewed and up-to-date. Referrals have been placed accordingly. Immunizations are up-to-date or declined.    Subjective:   Chief Complaint  Patient presents with   Annual Exam   HPI Karen Gibbs 52 y.o. female presents to office today for annual physical.  Patient has been counseled on age-appropriate routine health concerns for screening and prevention. These are reviewed and up-to-date. Referrals have been placed accordingly. Immunizations are up-to-date or declined.     COLONOSOCOPY: OVERDUE; Referral placed today MAMMOGRAM: DUE: referral placed PAP SMEAR: UTD   Needs GYN referral for AUB. States current GYN is not in network. She received DEPO at her last visit. States she had a menstrual cycle for 2 weeks straight after receiving this.  PER GYN NOTE: 10-18-2022 Hysterectomy recommended in 2022 after failed treatments but she did not have insurance at the time. Also declined EBX. Neg EBX in 2020. Treatments tried include Lysteda, Aygestin, IUD.  IUD fell out. Menses are mostly monthly, may be off by a week sometimes. Normal pap history, most recent in 2019. U/S 10/2020 showed small fibroids, largest measuring 2.1 cm. H/O iron deficiency anemia, PCP referred her for iron transfusions but she has not heard back. Does not tolerate PO iron.  Limited in medication management due to past failed treatments. Discussed surgical options of D&C with possible myomectomy or hysterectomy. She is considering.  Will repeat ultrasound. Depo today. Will call to help organize iron transfusions. All questions answered.     Review of Systems  Constitutional:  Negative for fever, malaise/fatigue and weight loss.  HENT: Negative.  Negative for nosebleeds.   Eyes: Negative.  Negative for blurred vision, double vision and photophobia.  Respiratory: Negative.  Negative for cough and shortness of breath.   Cardiovascular: Negative.  Negative for chest pain, palpitations and leg swelling.  Gastrointestinal: Negative.  Negative for heartburn, nausea and vomiting.  Genitourinary:        AUB  Musculoskeletal: Negative.  Negative for myalgias.  Skin: Negative.   Neurological: Negative.  Negative for dizziness, focal weakness, seizures and headaches.  Endo/Heme/Allergies: Negative.   Psychiatric/Behavioral: Negative.  Negative for suicidal ideas.     Past Medical History:  Diagnosis Date   Anemia    Environmental allergies    Fibroid     Past Surgical History:  Procedure Laterality Date   CESAREAN SECTION     C/S x 2    Family History  Problem Relation Age of Onset   Breast cancer Sister     Social History Reviewed with no changes to be made today.   Outpatient Medications Prior to Visit  Medication Sig Dispense Refill   albuterol (VENTOLIN HFA) 108 (90 Base) MCG/ACT inhaler Inhale 1-2 puffs into the lungs every 6 (six) hours as needed for wheezing or shortness of breath. 8 g 0   budesonide-formoterol (SYMBICORT) 80-4.5 MCG/ACT inhaler Inhale 2 puffs into the lungs in the morning and at bedtime. 1 each 1   ferrous sulfate 325 (65 FE) MG  EC tablet Take 1 tablet (325 mg total) by mouth 2 (two) times daily. 60 tablet 4   predniSONE (STERAPRED UNI-PAK 21 TAB) 10 MG (21) TBPK tablet As directed 21 tablet 0   No facility-administered medications prior to visit.    No Known Allergies     Objective:    BP (!) 153/86   Pulse 85   Ht 5\' 2"  (1.575 m)   Wt 209 lb (94.8 kg)   LMP 11/23/2022  (Approximate)   SpO2 99%   BMI 38.23 kg/m  Wt Readings from Last 3 Encounters:  11/26/22 209 lb (94.8 kg)  11/21/22 206 lb 11.2 oz (93.8 kg)  10/18/22 207 lb (93.9 kg)    Physical Exam Constitutional:      Appearance: She is well-developed.  HENT:     Head: Normocephalic and atraumatic.     Right Ear: Hearing, tympanic membrane, ear canal and external ear normal.     Left Ear: Hearing, tympanic membrane, ear canal and external ear normal.     Nose: Nose normal.     Right Turbinates: Not enlarged.     Left Turbinates: Not enlarged.     Mouth/Throat:     Lips: Pink.     Mouth: Mucous membranes are moist.     Dentition: No dental tenderness, gingival swelling, dental abscesses or gum lesions.     Pharynx: No oropharyngeal exudate.  Eyes:     General: No scleral icterus.       Right eye: No discharge.     Extraocular Movements: Extraocular movements intact.     Conjunctiva/sclera: Conjunctivae normal.     Pupils: Pupils are equal, round, and reactive to light.   Neck:     Thyroid: No thyromegaly.     Trachea: No tracheal deviation.  Cardiovascular:     Rate and Rhythm: Normal rate and regular rhythm.     Heart sounds: Normal heart sounds. No murmur heard.    No friction rub.  Pulmonary:     Effort: Pulmonary effort is normal. No accessory muscle usage or respiratory distress.     Breath sounds: Normal breath sounds. No decreased breath sounds, wheezing, rhonchi or rales.  Abdominal:     General: Bowel sounds are normal. There is no distension.     Palpations: Abdomen is soft. There is no mass.     Tenderness: There is no abdominal tenderness. There is no right CVA tenderness, left CVA tenderness, guarding or rebound.     Hernia: No hernia is present.  Musculoskeletal:        General: No tenderness or deformity. Normal range of motion.     Cervical back: Normal range of motion and neck supple.  Lymphadenopathy:     Cervical: No cervical adenopathy.  Skin:    General:  Skin is warm and dry.     Findings: No erythema.  Neurological:     Mental Status: She is alert and oriented to person, place, and time.     Cranial Nerves: No cranial nerve deficit.     Motor: Motor function is intact.     Coordination: Coordination is intact. Coordination normal.     Gait: Gait is intact.     Deep Tendon Reflexes:     Reflex Scores:      Patellar reflexes are 1+ on the right side and 1+ on the left side. Psychiatric:        Attention and Perception: Attention normal.        Mood  and Affect: Mood normal.        Speech: Speech normal.        Behavior: Behavior normal.        Thought Content: Thought content normal.        Judgment: Judgment normal.          Patient has been counseled extensively about nutrition and exercise as well as the importance of adherence with medications and regular follow-up. The patient was given clear instructions to go to ER or return to medical center if symptoms don't improve, worsen or new problems develop. The patient verbalized understanding.   Follow-up: Return if symptoms worsen or fail to improve.   Gildardo Pounds, FNP-BC [facility]   11/26/2022, 6:08 PM

## 2022-11-29 ENCOUNTER — Other Ambulatory Visit: Payer: BC Managed Care – PPO

## 2022-11-29 ENCOUNTER — Other Ambulatory Visit: Payer: BC Managed Care – PPO | Admitting: Nurse Practitioner

## 2022-12-02 ENCOUNTER — Encounter: Payer: Self-pay | Admitting: Nurse Practitioner

## 2022-12-03 ENCOUNTER — Other Ambulatory Visit: Payer: Self-pay | Admitting: Nurse Practitioner

## 2022-12-03 MED ORDER — BUDESONIDE-FORMOTEROL FUMARATE 80-4.5 MCG/ACT IN AERO: 2.0000 | INHALATION_SPRAY | Freq: Two times a day (BID) | RESPIRATORY_TRACT | 11 refills | Status: DC

## 2022-12-03 MED ORDER — ALBUTEROL SULFATE HFA 108 (90 BASE) MCG/ACT IN AERS: 1.0000 | INHALATION_SPRAY | Freq: Four times a day (QID) | RESPIRATORY_TRACT | 1 refills | Status: DC | PRN

## 2022-12-06 ENCOUNTER — Other Ambulatory Visit: Payer: Self-pay

## 2022-12-06 ENCOUNTER — Inpatient Hospital Stay: Payer: BC Managed Care – PPO | Attending: Hematology and Oncology

## 2022-12-06 VITALS — BP 136/75 | HR 73 | Temp 98.2°F | Resp 16

## 2022-12-06 DIAGNOSIS — D500 Iron deficiency anemia secondary to blood loss (chronic): Secondary | ICD-10-CM | POA: Diagnosis not present

## 2022-12-06 DIAGNOSIS — N920 Excessive and frequent menstruation with regular cycle: Secondary | ICD-10-CM | POA: Insufficient documentation

## 2022-12-06 MED ORDER — SODIUM CHLORIDE 0.9 % IV SOLN: Freq: Once | INTRAVENOUS | Status: AC

## 2022-12-06 MED ORDER — SODIUM CHLORIDE 0.9 % IV SOLN
300.0000 mg | Freq: Once | INTRAVENOUS | Status: AC
  Administered 2022-12-06: 300 mg via INTRAVENOUS
  Filled 2022-12-06: qty 300

## 2022-12-06 NOTE — Patient Instructions (Signed)
Iron Sucrose Injection Quel est ce mdicament ? Le FER-SACCHAROSE traite les faibles taux de fer (anmie ferriprive) chez les personnes atteintes d'une maladie rnale. Le fer est un minral qui joue un rle important dans la production des globules rouges qui transportent l'oxygne des poumons vers les autres organes. Ce mdicament peut tre utilis pour d'autres indications ; adressez-vous  votre mdecin ou  votre pharmacien si vous [employer] questions. NOM(S) DE MARQUE COURANT(S) : Venofer Que dois-je dire  mon fournisseur de [employer] de prendre ce mdicament ? Votre quipe de soins a besoin de savoir si vous tes dans l?une des situations suivantes : Anmie non due  de faibles taux de Scientist, product/process development cardiaque Taux levs de fer dans le sang [place] rnale [place] du foie Raction inhabituelle ou allergique au fer,  d?autres mdicaments,  des aliments,  des colorants ou  des conservateurs [place] ou projet de grossesse Allaitement Comment dois-je utiliser ce mdicament ? Ce mdicament est destin  tre administr par perfusion dans une veine. Il est administr dans un tablissement [employer] ou Advice worker. Pour toute information relative  l'utilisation de ce mdicament [employer], consultez votre quipe de [place]. Bien que ce mdicament puisse tre prescrit, pour certaines affections,  des enfants gs de 2 ans seulement, des prcautions s?imposent. Surdosage : si vous pensez avoir pris ce mdicament en excs, contactez immdiatement un centre [employer]. REMARQUE : ce mdicament vous est uniquement destin. Ne partagez pas ce Location manager. Que faire si je saute une dose ? Prsentez-vous  tous les rendez-vous pour recevoir les doses suivantes. Il est important de ne pas omettre votre dose. Appelez votre quipe de soins si vous ne pouvez pas Engineer, mining. Quelles sont les interactions possibles avec ce  mdicament ? Ne prenez pas ce [employer] substances suivantes : Dfroxamine Dimercaprol Autres produits  base de fer Ce mdicament peut galement interagir avec les substances suivantes : Chloramphnicol Dfrasirox Cette liste peut ne pas dcrire toutes les interactions mdicamenteuses possibles. Donnez  votre fournisseur de [employer] de tous les mdicaments, plantes mdicinales, mdicaments en vente libre ou complments alimentaires que vous prenez. Dites-lui aussi si vous fumez, buvez de l'alcool ou consommez des drogues illicites. Certaines substances peuvent interagir Physiological scientist. Que dois-je Museum/gallery exhibitions officer par ce mdicament ? Prenez rendez-vous rgulirement [employer] quipe de [place]. Informez votre quipe de soins si vos symptmes ne commencent pas  s?amliorer ou s?ils s?aggravent. Vous devrez Psychologist, counselling des analyses de sang Radiographer, therapeutic par [employer]. Vous devrez ventuellement suivre un rgime alimentaire spcial. Consultez votre quipe de soins. Les aliments contenant du fer incluent les crales compltes, les fruits secs, les haricots, les pois, les lgumes verts  feuilles et les abats [employer], rein). Quels effets secondaires puis-je remarquer en prenant ce mdicament ? Effets secondaires que vous devez signaler  votre quipe de soins ds que possible : Ractions allergiques : ruption cutane, dmangeaisons, urticaire, gonflement du visage, des lvres, de la langue ou de la gorge Hypotension artrielle : vertiges, sensation d?vanouissement ou d?tourdissement, vision trouble [place] Effets secondaires ne ncessitant gnralement pas un avis mdical (signalez-les  votre quipe de soins s'ils persistent ou sont gnants) : Bouffes congestives Mal de tte Douleurs articulaires Douleurs musculaires Nauses Douleur, rougeur ou irritation au site d?injection Teacher, English as a foreign language ne pas dcrire tous les  effets secondaires possibles. Appelez votre mdecin pour [employer] sujet des [employer].  Vous pouvez signaler les effets secondaires  la FDA, au [phone]. O dois-je conserver mon mdicament ? Ce mdicament est administr dans un tablissement [employer] ou un cabinet mdical et ne sera pas conserv  domicile. REMARQUE : cette notice est un rsum. Elle peut ne pas contenir toutes les informations possibles. Si vous avez des questions  propos de ce mdicament, Secretary/administrator mdecin, votre pharmacien ou votre fournisseur de [place].  2023 Elsevier/Gold Standard (2022-07-11 00:00:00)

## 2022-12-06 NOTE — Progress Notes (Signed)
Pt observed for 30 minutes post Venofer infusion. Pt tolerated trtmt well w/out incident. VSS at discharge.  Ambulatory to lobby.   

## 2022-12-20 ENCOUNTER — Inpatient Hospital Stay (HOSPITAL_BASED_OUTPATIENT_CLINIC_OR_DEPARTMENT_OTHER): Payer: BC Managed Care – PPO

## 2022-12-20 VITALS — BP 140/72 | HR 75 | Temp 98.2°F | Resp 18

## 2022-12-20 DIAGNOSIS — N920 Excessive and frequent menstruation with regular cycle: Secondary | ICD-10-CM | POA: Diagnosis not present

## 2022-12-20 DIAGNOSIS — D500 Iron deficiency anemia secondary to blood loss (chronic): Secondary | ICD-10-CM

## 2022-12-20 MED ORDER — SODIUM CHLORIDE 0.9 % IV SOLN: Freq: Once | INTRAVENOUS | Status: AC

## 2022-12-20 MED ORDER — SODIUM CHLORIDE 0.9 % IV SOLN
300.0000 mg | Freq: Once | INTRAVENOUS | Status: AC
  Administered 2022-12-20: 300 mg via INTRAVENOUS
  Filled 2022-12-20: qty 300

## 2022-12-20 NOTE — Progress Notes (Signed)
Pt declined 30 min observation period post venofer infusion. Pt tolerated tx well w/o c/o. VSS at discharge. Pt ambulated to lobby. 

## 2022-12-20 NOTE — Patient Instructions (Signed)

## 2022-12-25 ENCOUNTER — Encounter: Payer: Self-pay | Admitting: Hematology and Oncology

## 2022-12-25 ENCOUNTER — Ambulatory Visit (INDEPENDENT_AMBULATORY_CARE_PROVIDER_SITE_OTHER): Payer: BC Managed Care – PPO

## 2022-12-25 ENCOUNTER — Ambulatory Visit (HOSPITAL_COMMUNITY)
Admission: EM | Admit: 2022-12-25 | Discharge: 2022-12-25 | Disposition: A | Discharge status: Home / Self Care | Payer: BC Managed Care – PPO | Attending: Family Medicine | Admitting: Family Medicine

## 2022-12-25 ENCOUNTER — Encounter (HOSPITAL_COMMUNITY): Payer: Self-pay

## 2022-12-25 DIAGNOSIS — S92425A Nondisplaced fracture of distal phalanx of left great toe, initial encounter for closed fracture: Secondary | ICD-10-CM | POA: Diagnosis not present

## 2022-12-25 MED ORDER — KETOROLAC TROMETHAMINE 30 MG/ML IJ SOLN
INTRAMUSCULAR | Status: AC
  Filled 2022-12-25: qty 1

## 2022-12-25 MED ORDER — TRAMADOL HCL 50 MG PO TABS: 50.0000 mg | ORAL_TABLET | Freq: Four times a day (QID) | ORAL | 0 refills | Status: DC | PRN

## 2022-12-25 MED ORDER — KETOROLAC TROMETHAMINE 30 MG/ML IJ SOLN: 30.0000 mg | Freq: Once | INTRAMUSCULAR | Status: DC

## 2022-12-25 NOTE — Discharge Instructions (Signed)
The in bone of your left big toe is broken.  It is not displaced.  Take tramadol 50 mg-- 1 tablet every 6 hours as needed for pain.  This medication can make you sleepy or dizzy

## 2022-12-25 NOTE — ED Provider Notes (Signed)
[facility]    CSN: [id number] Arrival date & time: 12/25/22  0807      History   Chief Complaint Chief Complaint  Patient presents with   Toe Injury    HPI Karen Gibbs is a 52 y.o. female.   HPI Here for pain in her left great toe.  Yesterday evening she was walking and slipped and struck the wall with her left great toe.  She is not on any blood thinners, and is not allergic to any medication  She does have iron deficiency anemia. Last EGFR was 115 earlier this year.    Past Medical History:  Diagnosis Date   Anemia    Environmental allergies    Fibroid     Patient Active Problem List   Diagnosis Date Noted   IDA (iron deficiency anemia) 11/21/2022   Fibroids 11/28/2020   Language barrier 07/25/2018   Menorrhagia with regular cycle 11/25/2014   Dysmenorrhea 11/25/2014    Past Surgical History:  Procedure Laterality Date   CESAREAN SECTION     C/S x 2    OB History     Gravida  5   Para  4   Term  4   Preterm  0   AB  1   Living  4      SAB  1   IAB  0   Ectopic  0   Multiple      Live Births  4            Home Medications    Prior to Admission medications   Medication Sig Start Date End Date Taking? Authorizing Provider  traMADol (ULTRAM) 50 MG tablet Take 1 tablet (50 mg total) by mouth every 6 (six) hours as needed (pain). 12/25/22  Yes Zenia Resides, MD  albuterol (VENTOLIN HFA) 108 (90 Base) MCG/ACT inhaler Inhale 1-2 puffs into the lungs every 6 (six) hours as needed for wheezing or shortness of breath. 12/03/22   Claiborne Rigg, NP  budesonide-formoterol (SYMBICORT) 80-4.5 MCG/ACT inhaler Inhale 2 puffs into the lungs in the morning and at bedtime. 12/03/22   Claiborne Rigg, NP  ferrous sulfate 325 (65 FE) MG EC tablet Take 1 tablet (325 mg total) by mouth 2 (two) times daily. 09/20/22   Claiborne Rigg, NP    Family History Family History  Problem Relation Age of Onset   Breast cancer Sister      Social History Social History   Tobacco Use   Smoking status: Never   Smokeless tobacco: Never  Vaping Use   Vaping Use: Never used  Substance Use Topics   Alcohol use: No   Drug use: No     Allergies   Patient has no known allergies.   Review of Systems Review of Systems   Physical Exam Triage Vital Signs ED Triage Vitals [12/25/22 0849]  Enc Vitals Group     BP 119/82     Pulse Rate 82     Resp 16     Temp 98.4 F (36.9 C)     Temp Source Oral     SpO2 98 %     Weight      Height      Head Circumference      Peak Flow      Pain Score 10     Pain Loc      Pain Edu?      Excl. in GC?    No data found.  Updated Vital Signs BP 119/82 (BP Location: Right Arm)   Pulse 82   Temp 98.4 F (36.9 C) (Oral)   Resp 16   LMP 11/23/2022 (Approximate)   SpO2 98%   Visual Acuity Right Eye Distance:   Left Eye Distance:   Bilateral Distance:    Right Eye Near:   Left Eye Near:    Bilateral Near:     Physical Exam Constitutional:      General: She is not in acute distress.    Appearance: She is not toxic-appearing.  Musculoskeletal:     Comments: There is tenderness and swelling of the left great toe. There is swelling but no tenderness of the distal medial foot on the dorsum.  No deformity  Skin:    Coloration: Skin is not pale.  Neurological:     General: No focal deficit present.     Mental Status: She is alert and oriented to person, place, and time.  Psychiatric:        Behavior: Behavior normal.      UC Treatments / Results  Labs (all labs ordered are listed, but only abnormal results are displayed) Labs Reviewed - No data to display  EKG   Radiology DG Toe Great Left  Result Date: 12/25/2022 CLINICAL DATA:  Posttraumatic left great toe pain. EXAM: LEFT GREAT TOE COMPARISON:  None Available. FINDINGS: The bones appear adequately mineralized. There is evidence of a subtle nondisplaced intra-articular fracture involving the base of  the distal 1st phalanx, best demonstrated on the PA view. There is no dislocation or underlying significant arthropathy. There may be some soft tissue swelling in the great toe without evidence of foreign body or soft tissue emphysema. IMPRESSION: Nondisplaced intra-articular fracture of the distal 1st phalanx. Electronically Signed   By: Carey Bullocks M.D.   On: 12/25/2022 09:54    Procedures Procedures (including critical care time)  Medications Ordered in UC Medications  ketorolac (TORADOL) 30 MG/ML injection 30 mg (has no administration in time range)    Initial Impression / Assessment and Plan / UC Course  I have reviewed the triage vital signs and the nursing notes.  Pertinent labs & imaging results that were available during my care of the patient were reviewed by me and considered in my medical decision making (see chart for details).       There is a fracture of the distal phalanx of the left great toe.  Tramadol was sent in for pain.  Toradol injection was given here.  The toe was buddy taped and a postop shoe is provided.  Final Clinical Impressions(s) / UC Diagnoses   Final diagnoses:  Closed nondisplaced fracture of distal phalanx of left great toe, initial encounter     Discharge Instructions      The in bone of your left big toe is broken.  It is not displaced.  Take tramadol 50 mg-- 1 tablet every 6 hours as needed for pain.  This medication can make you sleepy or dizzy       ED Prescriptions     Medication Sig Dispense Auth. Provider   traMADol (ULTRAM) 50 MG tablet Take 1 tablet (50 mg total) by mouth every 6 (six) hours as needed (pain). 12 tablet Cheray Pardi, Janace Aris, MD      I have reviewed the PDMP during this encounter.   Zenia Resides, MD 12/25/22 1004

## 2022-12-25 NOTE — ED Triage Notes (Signed)
Pt states when she was walking last night she hit the wall with her left great toe.  C/O pain,ambulating with a limp. Took tylenol at home for the pain.

## 2022-12-27 ENCOUNTER — Encounter: Payer: Self-pay | Admitting: Hematology and Oncology

## 2023-01-03 ENCOUNTER — Inpatient Hospital Stay: Payer: BC Managed Care – PPO | Attending: Hematology and Oncology

## 2023-01-03 ENCOUNTER — Ambulatory Visit: Payer: BC Managed Care – PPO | Admitting: Podiatry

## 2023-01-03 VITALS — BP 138/78 | HR 68 | Temp 98.2°F | Resp 16

## 2023-01-03 DIAGNOSIS — D500 Iron deficiency anemia secondary to blood loss (chronic): Secondary | ICD-10-CM | POA: Insufficient documentation

## 2023-01-03 DIAGNOSIS — N920 Excessive and frequent menstruation with regular cycle: Secondary | ICD-10-CM | POA: Diagnosis not present

## 2023-01-03 MED ORDER — SODIUM CHLORIDE 0.9 % IV SOLN
300.0000 mg | Freq: Once | INTRAVENOUS | Status: AC
  Administered 2023-01-03: 300 mg via INTRAVENOUS
  Filled 2023-01-03: qty 300

## 2023-01-03 MED ORDER — SODIUM CHLORIDE 0.9 % IV SOLN: Freq: Once | INTRAVENOUS | Status: AC

## 2023-01-03 NOTE — Progress Notes (Signed)
Pt observed for 10 minutes post Venofer infusion. Pt tolerated trtmt well w/out incident. VSS at discharge.  Ambulatory to lobby.   

## 2023-01-03 NOTE — Patient Instructions (Signed)
Iron Sucrose Injection Quel est ce mdicament ? Le FER-SACCHAROSE traite les faibles taux de fer (anmie ferriprive) chez les personnes atteintes d'une maladie rnale. Le fer est un minral qui joue un rle important dans la production des globules rouges qui transportent l'oxygne des poumons vers les autres organes. Ce mdicament peut tre utilis pour d'autres indications ; adressez-vous  votre mdecin ou  votre pharmacien si vous avez des questions. NOM(S) DE MARQUE COURANT(S) : Venofer Que dois-je dire  mon fournisseur de soins avant de prendre ce mdicament ? Votre quipe de soins a besoin de savoir si vous tes dans l?une des situations suivantes : Anmie non due  de faibles taux de fer Maladie cardiaque Taux levs de fer dans le sang Maladie rnale Maladie du foie Raction inhabituelle ou allergique au fer,  d?autres mdicaments,  des aliments,  des colorants ou  des conservateurs Grossesse ou projet de grossesse Allaitement Comment dois-je utiliser ce mdicament ? Ce mdicament est destin  tre administr par perfusion dans une veine. Il est administr dans un tablissement hospitalier ou un cabinet mdical. Pour toute information relative  l'utilisation de ce mdicament chez l'enfant, consultez votre quipe de soins. Bien que ce mdicament puisse tre prescrit, pour certaines affections,  des enfants gs de 2 ans seulement, des prcautions s?imposent. Surdosage : si vous pensez avoir pris ce mdicament en excs, contactez immdiatement un centre antipoison ou un service d'urgences. REMARQUE : ce mdicament vous est uniquement destin. Ne partagez pas ce mdicament avec d'autres personnes. Que faire si je saute une dose ? Prsentez-vous  tous les rendez-vous pour recevoir les doses suivantes. Il est important de ne pas omettre votre dose. Appelez votre quipe de soins si vous ne pouvez pas aller  votre rendez-vous. Quelles sont les interactions possibles avec ce  mdicament ? Ne prenez pas ce mdicament avec les substances suivantes : Dfroxamine Dimercaprol Autres produits  base de fer Ce mdicament peut galement interagir avec les substances suivantes : Chloramphnicol Dfrasirox Cette liste peut ne pas dcrire toutes les interactions mdicamenteuses possibles. Donnez  votre fournisseur de soins une liste de tous les mdicaments, plantes mdicinales, mdicaments en vente libre ou complments alimentaires que vous prenez. Dites-lui aussi si vous fumez, buvez de l'alcool ou consommez des drogues illicites. Certaines substances peuvent interagir avec votre mdicament. Que dois-je surveiller au cours du traitement par ce mdicament ? Prenez rendez-vous rgulirement avec votre quipe de soins. Informez votre quipe de soins si vos symptmes ne commencent pas  s?amliorer ou s?ils s?aggravent. Vous devrez ventuellement effectuer des analyses de sang durant le traitement par ce mdicament. Vous devrez ventuellement suivre un rgime alimentaire spcial. Consultez votre quipe de soins. Les aliments contenant du fer incluent les crales compltes, les fruits secs, les haricots, les pois, les lgumes verts  feuilles et les abats (foie, rein). Quels effets secondaires puis-je remarquer en prenant ce mdicament ? Effets secondaires que vous devez signaler  votre quipe de soins ds que possible : Ractions allergiques : ruption cutane, dmangeaisons, urticaire, gonflement du visage, des lvres, de la langue ou de la gorge Hypotension artrielle : vertiges, sensation d?vanouissement ou d?tourdissement, vision trouble Essoufflements Effets secondaires ne ncessitant gnralement pas un avis mdical (signalez-les  votre quipe de soins s'ils persistent ou sont gnants) : Bouffes congestives Mal de tte Douleurs articulaires Douleurs musculaires Nauses Douleur, rougeur ou irritation au site d?injection Cette liste peut ne pas dcrire tous les  effets secondaires possibles. Appelez votre mdecin pour lui demander conseil au sujet des effets secondaires.   Vous pouvez signaler les effets secondaires  la FDA, au [phone]. O dois-je conserver mon mdicament ? Ce mdicament est administr dans un tablissement hospitalier ou un cabinet mdical et ne sera pas conserv  domicile. REMARQUE : cette notice est un rsum. Elle peut ne pas contenir toutes les informations possibles. Si vous avez des questions  propos de ce mdicament, consultez votre mdecin, votre pharmacien ou votre fournisseur de soins.  2023 Elsevier/Gold Standard (2022-07-11 00:00:00)  

## 2023-01-04 ENCOUNTER — Ambulatory Visit: Payer: BC Managed Care – PPO

## 2023-02-21 ENCOUNTER — Inpatient Hospital Stay: Payer: BC Managed Care – PPO | Attending: Hematology and Oncology | Admitting: Hematology and Oncology

## 2023-02-21 ENCOUNTER — Ambulatory Visit: Payer: BC Managed Care – PPO

## 2023-02-21 ENCOUNTER — Other Ambulatory Visit: Payer: Self-pay

## 2023-02-21 VITALS — BP 158/88 | HR 83 | Temp 97.5°F | Resp 16 | Wt 216.9 lb

## 2023-02-21 DIAGNOSIS — N920 Excessive and frequent menstruation with regular cycle: Secondary | ICD-10-CM | POA: Diagnosis not present

## 2023-02-21 DIAGNOSIS — D500 Iron deficiency anemia secondary to blood loss (chronic): Secondary | ICD-10-CM | POA: Insufficient documentation

## 2023-02-21 NOTE — Progress Notes (Signed)
[facility] CONSULT NOTE  Patient Care Team: Claiborne Rigg, NP as PCP - General (Nurse Practitioner)  CHIEF COMPLAINTS/PURPOSE OF CONSULTATION:  IDA  ASSESSMENT & PLAN:   This is a pleasant 52 year old female patient referred to hematology for IDA secondary to menstrual blood loss.  She received intravenous iron infusion and has felt better however continues to have very long menstrual cycles lasting for almost 2 weeks hence worried about recurrence of iron deficiency anemia.  She says she cannot really afford to do hysterectomy at this time because she cannot take time off, she has to continue working.  She is however interested in other options such as ablation.  I have recommended that she try to reestablish with her gynecology to discuss about other treatment options for this heavy menstrual cycles. From iron deficiency standpoint, will repeat CBC, iron panel and ferritin today follow-up on the labs She does not appear to have had her labs drawn today.  Will try to bring her back for labs and reassess her iron deficiency anemia.  HISTORY OF PRESENTING ILLNESS:   Karen Gibbs 52 y.o. female is here because of severe IDA.  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  She felt better since the last iron infusion.  She continues to have heavy menstrual cycles and last cycle has lasted for 2 weeks.  She says the last gynecologist recommended hysterectomy but she can't do this because she needs to keep working and can't afford to take a break She says overall her fatigue and her shortness of breath have significantly improved. Rest of the pertinent 10 point ROS reviewed and neg.   MEDICAL HISTORY:  Past Medical History:  Diagnosis Date   Anemia    Environmental allergies    Fibroid     SURGICAL HISTORY: Past Surgical History:  Procedure Laterality  Date   CESAREAN SECTION     C/S x 2    SOCIAL HISTORY: Social History   Socioeconomic History   Marital status: Married    Spouse name: Not on file   Number of children: Not on file   Years of education: Not on file   Highest education level: Not on file  Occupational History   Not on file  Tobacco Use   Smoking status: Never   Smokeless tobacco: Never  Vaping Use   Vaping Use: Never used  Substance and Sexual Activity   Alcohol use: No   Drug use: No   Sexual activity: Yes    Birth control/protection: None  Other Topics Concern   Not on file  Social History Narrative   ** Merged History Encounter **       ** Merged History Encounter **       Social Determinants of Health   Financial Resource Strain: Not on file  Food Insecurity: No Food Insecurity (11/28/2020)   Hunger Vital Sign    Worried About Running Out of Food in the Last Year: Never true    Ran Out of Food in the Last Year: Never true  Transportation Needs: No Transportation Needs (11/28/2020)   PRAPARE - Administrator, Civil Service (Medical): No    Lack of Transportation (Non-Medical): No  Physical Activity: Not on file  Stress: Not on file  Social Connections: Not on file  Intimate Partner Violence: Not on file    FAMILY HISTORY: Family History  Problem Relation Age of Onset   Breast cancer Sister     ALLERGIES:  has No Known Allergies.  MEDICATIONS:  Current Outpatient Medications  Medication Sig Dispense Refill   albuterol (VENTOLIN HFA) 108 (90 Base) MCG/ACT inhaler Inhale 1-2 puffs into the lungs every 6 (six) hours as needed for wheezing or shortness of breath. 8 g 1   budesonide-formoterol (SYMBICORT) 80-4.5 MCG/ACT inhaler Inhale 2 puffs into the lungs in the morning and at bedtime. 10.2 g 11   ferrous sulfate 325 (65 FE) MG EC tablet Take 1 tablet (325 mg total) by mouth 2 (two) times daily. 60 tablet 4   traMADol (ULTRAM) 50 MG tablet Take 1 tablet (50 mg total) by mouth  every 6 (six) hours as needed (pain). 12 tablet 0   No current facility-administered medications for this visit.     PHYSICAL EXAMINATION: ECOG PERFORMANCE STATUS: 0 - Asymptomatic  Vitals:   02/21/23 1502  BP: (!) 158/88  Pulse: 83  Resp: 16  Temp: (!) 97.5 F (36.4 C)  SpO2: 98%   Filed Weights   02/21/23 1502  Weight: 216 lb 14.4 oz (98.4 kg)    GENERAL:alert, no distress and comfortable SKIN: skin color, texture, turgor are normal, no rashes or significant lesions EYES: normal, conjunctiva are pink and non-injected, sclera clear OROPHARYNX:no exudate, no erythema and lips, buccal mucosa, and tongue normal  NECK: supple, thyroid normal size, non-tender, without nodularity LYMPH:  no palpable lymphadenopathy in the cervical, axillary  LUNGS: clear to auscultation and percussion with normal breathing effort HEART: regular rate & rhythm and no murmurs and no lower extremity edema ABDOMEN:abdomen soft, non-tender and normal bowel sounds Musculoskeletal:no cyanosis of digits and no clubbing  PSYCH: alert & oriented x 3 with fluent speech NEURO: no focal motor/sensory deficits  LABORATORY DATA:  I have reviewed the data as listed Lab Results  Component Value Date   WBC 9.3 11/21/2022   HGB 9.6 (L) 11/21/2022   HCT 33.4 (L) 11/21/2022   MCV 62.3 (L) 11/21/2022   PLT 335 11/21/2022     Chemistry      Component Value Date/Time   NA 139 09/19/2022 1637   K 4.2 09/19/2022 1637   CL 104 09/19/2022 1637   CO2 22 09/19/2022 1637   BUN 9 09/19/2022 1637   CREATININE 0.49 (L) 09/19/2022 1637   CREATININE 0.50 10/08/2014 1256      Component Value Date/Time   CALCIUM 9.2 09/19/2022 1637   ALKPHOS 53 09/19/2022 1637   AST 17 09/19/2022 1637   ALT 10 09/19/2022 1637   BILITOT 0.4 09/19/2022 1637       RADIOGRAPHIC STUDIES: I have personally reviewed the radiological images as listed and agreed with the findings in the report. No results found.  All questions were  answered. The patient knows to call the clinic with any problems, questions or concerns. I spent 20 minutes in the care of this patient including H and P, review of records, counseling and coordination of care.     Karen Moulds, MD 02/21/2023 3:09 PM

## 2023-03-29 ENCOUNTER — Other Ambulatory Visit: Payer: Self-pay | Admitting: Nurse Practitioner

## 2023-03-29 ENCOUNTER — Encounter: Payer: Self-pay | Admitting: Hematology and Oncology

## 2023-03-29 DIAGNOSIS — Z1231 Encounter for screening mammogram for malignant neoplasm of breast: Secondary | ICD-10-CM

## 2023-04-04 ENCOUNTER — Encounter: Payer: Self-pay | Admitting: Physician Assistant

## 2023-04-04 ENCOUNTER — Ambulatory Visit: Payer: BC Managed Care – PPO | Attending: Physician Assistant | Admitting: Physician Assistant

## 2023-04-04 VITALS — BP 145/96 | HR 70 | Wt 219.8 lb

## 2023-04-04 DIAGNOSIS — R030 Elevated blood-pressure reading, without diagnosis of hypertension: Secondary | ICD-10-CM

## 2023-04-04 DIAGNOSIS — N644 Mastodynia: Secondary | ICD-10-CM

## 2023-04-04 DIAGNOSIS — Z1239 Encounter for other screening for malignant neoplasm of breast: Secondary | ICD-10-CM

## 2023-04-04 NOTE — Patient Instructions (Addendum)
  Please call [facility] [phone] to schedule your mammogram.  The order has been placed.

## 2023-04-04 NOTE — Progress Notes (Signed)
Patient ID: Karen Gibbs, female   DOB: 10-16-1970, 52 y.o.   MRN: [id number]   Karen Gibbs, is a 52 y.o. female  NUU:[id number]  HKV:[id number]  DOB - 1971/02/11  Chief Complaint  Patient presents with   Breast Pain       Subjective:   Karen Gibbs is a 52 y.o. female here today for breast exam and bc breasts have been tender "for years."  She does SBE and has not noticed anything abnormal.  Still having periods and last period started about 5 days ago.  Has had annual mammography.  Last 12/2021 was normal  No problems updated.  ALLERGIES: No Known Allergies  PAST MEDICAL HISTORY: Past Medical History:  Diagnosis Date   Anemia    Environmental allergies    Fibroid     MEDICATIONS AT HOME: Prior to Admission medications   Not on File    ROS: Neg HEENT Neg resp Neg cardiac Neg GI Neg GU Neg MS Neg psych Neg neuro  Objective:   Vitals:   04/04/23 0856  BP: (!) 145/99  Pulse: 70  SpO2: 99%  Weight: 219 lb 12.8 oz (99.7 kg)   Exam General appearance : Awake, alert, not in any distress. Speech Clear. Not toxic looking HEENT: Atraumatic and Normocephalic B breasts pendulous.  Normal and no skin changes B.  There are what feel like some fibrocystic changes in B breasts but no discreet masses or lumps.  Both axilla also w/o any areas of concern/lumps.  Chest: Good air entry bilaterally, CTAB.  No rales/rhonchi/wheezing CVS: S1 S2 regular, no murmurs.  Extremities: B/L Lower Ext shows no edema, both legs are warm to touch Neurology: Awake alert, and oriented X 3, CN II-XII intact, Non focal Skin: No Rash  Data Review Lab Results  Component Value Date   HGBA1C 5.6 09/19/2022   HGBA1C 5.5 04/28/2020   HGBA1C 6.1 (H) 12/30/2019    Assessment & Plan   1. Screening breast examination No discreet masses or lumps  2. Breast tenderness Mammo already order by Bertram Denver.   Please call [facility] [phone] to schedule your mammogram.   The order has been placed.    3.  Elevated blood pressure without dx htn- We have discussed target BP range and blood pressure goal. I have advised patient to check BP regularly and to call us back or report to clinic if the numbers are consistently higher than 135/85. We discussed the importance of compliance with medical therapy and DASH diet recommended, consequences of uncontrolled hypertension discussed.      Return if symptoms worsen or fail to improve.  The patient was given clear instructions to go to ER or return to medical center if symptoms don't improve, worsen or new problems develop. The patient verbalized understanding. The patient was told to call to get lab results if they haven't heard anything in the next week.      Georgian Co, PA-C [facility] [place], [place] [phone]   04/04/2023, 9:35 AM

## 2023-07-02 DIAGNOSIS — Z111 Encounter for screening for respiratory tuberculosis: Secondary | ICD-10-CM | POA: Diagnosis not present

## 2023-07-05 ENCOUNTER — Ambulatory Visit: Payer: BC Managed Care – PPO | Admitting: Student

## 2023-07-05 ENCOUNTER — Encounter: Payer: Self-pay | Admitting: Student

## 2023-07-05 VITALS — BP 170/73 | HR 81 | Temp 98.4°F | Ht 62.0 in | Wt 225.4 lb

## 2023-07-05 DIAGNOSIS — I10 Essential (primary) hypertension: Secondary | ICD-10-CM | POA: Diagnosis not present

## 2023-07-05 DIAGNOSIS — N946 Dysmenorrhea, unspecified: Secondary | ICD-10-CM | POA: Diagnosis not present

## 2023-07-05 DIAGNOSIS — N644 Mastodynia: Secondary | ICD-10-CM

## 2023-07-05 DIAGNOSIS — N6012 Diffuse cystic mastopathy of left breast: Secondary | ICD-10-CM

## 2023-07-05 DIAGNOSIS — I159 Secondary hypertension, unspecified: Secondary | ICD-10-CM

## 2023-07-05 DIAGNOSIS — J45909 Unspecified asthma, uncomplicated: Secondary | ICD-10-CM | POA: Diagnosis not present

## 2023-07-05 DIAGNOSIS — Z0000 Encounter for general adult medical examination without abnormal findings: Secondary | ICD-10-CM | POA: Diagnosis not present

## 2023-07-05 DIAGNOSIS — Z6841 Body Mass Index (BMI) 40.0 and over, adult: Secondary | ICD-10-CM

## 2023-07-05 DIAGNOSIS — D509 Iron deficiency anemia, unspecified: Secondary | ICD-10-CM

## 2023-07-05 DIAGNOSIS — J4520 Mild intermittent asthma, uncomplicated: Secondary | ICD-10-CM

## 2023-07-05 DIAGNOSIS — D500 Iron deficiency anemia secondary to blood loss (chronic): Secondary | ICD-10-CM | POA: Diagnosis not present

## 2023-07-05 MED ORDER — LOSARTAN POTASSIUM 25 MG PO TABS: 25.0000 mg | ORAL_TABLET | Freq: Every day | ORAL | 3 refills | Status: DC

## 2023-07-05 MED ORDER — ALBUTEROL SULFATE HFA 108 (90 BASE) MCG/ACT IN AERS: 2.0000 | INHALATION_SPRAY | Freq: Four times a day (QID) | RESPIRATORY_TRACT | 2 refills | Status: DC | PRN

## 2023-07-05 NOTE — Patient Instructions (Addendum)
Thank you so much for coming to the clinic today!   I am starting you on a blood pressure medication called Losartan, we would like to see you back in 1 month for this.  We are also checking your iron today I will give you a call with the results.  I also put an order for mammogram.  I have also sent in refill of the asthma inhaler.   If you have any questions please feel free to the call the clinic at anytime at [phone]. It was a pleasure seeing you!  Best, Dr. Thomasene Ripple     Merci beaucoup d'tre venu  la clinique aujourd'hui !   Je commence  vous prescrire un mdicament contre la tension artrielle appel Losartan, nous aimerions vous revoir dans 1 mois pour [employer].  Nous vrifions galement votre fer aujourd'hui. Je vous appellerai avec les rsultats.  J'ai galement pass une commande pour [employer].  J'ai galement envoy une recharge de l'inhalateur pour l'asthme.   Si vous avez des questions, n'hsitez pas  Automotive engineer  tout moment au [phone]. Ce fut un plaisir de vous voir !  Meilleur, Dr Blanca Thornton

## 2023-07-06 DIAGNOSIS — Z0000 Encounter for general adult medical examination without abnormal findings: Secondary | ICD-10-CM | POA: Insufficient documentation

## 2023-07-06 DIAGNOSIS — I10 Essential (primary) hypertension: Secondary | ICD-10-CM | POA: Insufficient documentation

## 2023-07-06 DIAGNOSIS — J45909 Unspecified asthma, uncomplicated: Secondary | ICD-10-CM | POA: Insufficient documentation

## 2023-07-06 NOTE — Assessment & Plan Note (Signed)
Pt with history of asthma currently only on albuterol inhaler. She was prescribed pulmicort from the emergency department, however has been using it one puff at night and one puff in the morning. She states that her asthma gets worse when the seasons are changing currently. Denies any recent exacerbation or wheezing. Will recommend continued albuterol use PRN (she has not needed to use in the last couple weeks), and stopping the Pulmicort. Once her body adjusts to the seasons, can think about step up therapy if she finds herself using the albuterol inhaler more frequently.

## 2023-07-06 NOTE — Assessment & Plan Note (Signed)
Pt with BMI of 41.23, qualifies for morbid obesity. She is attempting to exercise as well as control her diet. She may benefit from a GLP-1 in the future.

## 2023-07-06 NOTE — Assessment & Plan Note (Signed)
Likely in the setting of chronic abnormal menstrual bleeding. Last iron panel in March of 2024 showed a Ferritin of 6 and Hb of 9.7. She has received IV infusions of iron in April and May. She also follows with oncology for management. She denies any symptoms of anemia such as dizziness or fatigue. Will recheck today and set up infusions if necessary.

## 2023-07-06 NOTE — Assessment & Plan Note (Addendum)
Pt with no recent lipid panel, will check today, Mammogram also ordered today.

## 2023-07-06 NOTE — Assessment & Plan Note (Signed)
Pt with history of dysmenorrhea, previously followed by OB/GYN. At this time, she has not had a menstrual period since August. And she does not know if it's due to menopause or her history of dysmenorrhea. Previous OB/GYN recommended hysterectomy, however pt was unsure at that time and hasn't seen an OB/Gyn since 2022. Will place referral for OB/GYN

## 2023-07-06 NOTE — Assessment & Plan Note (Signed)
Per chart review, pt has been hypertensive at most of her office visits, however has not been on any anti-hypertensive medications. She denies any symptoms such as chest pain or shortness of breath. Will initiate losartan 25mg  at this time, and recheck in one month.

## 2023-07-06 NOTE — Progress Notes (Signed)
CC: Establishing care  HPI:  Karen Gibbs is a 52 y.o. female living with a history stated below and presents today to establish care Please see problem based assessment and plan for additional details.She has a past medical history of dysmenorrhea, asthma, and iron deficiency anemia. 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 She has four kids, and denies any use of cigarettes or tobacco containing products.   Past Medical History:  Diagnosis Date   Anemia    Environmental allergies    Fibroid     No current outpatient medications on file prior to visit.   No current facility-administered medications on file prior to visit.    Family History  Problem Relation Age of Onset   Breast cancer Sister     Social History   Socioeconomic History   Marital status: Married    Spouse name: Not on file   Number of children: Not on file   Years of education: Not on file   Highest education level: Not on file  Occupational History   Not on file  Tobacco Use   Smoking status: Never   Smokeless tobacco: Never  Vaping Use   Vaping status: Never Used  Substance and Sexual Activity   Alcohol use: No   Drug use: No   Sexual activity: Yes    Birth control/protection: None  Other Topics Concern   Not on file  Social History Narrative   ** Merged History Encounter **       ** Merged History Encounter **       Social Determinants of Health   Financial Resource Strain: Not on file  Food Insecurity: No Food Insecurity (11/28/2020)   Hunger Vital Sign    Worried About Running Out of Food in the Last Year: Never true    Ran Out of Food in the Last Year: Never true  Transportation Needs: No Transportation Needs (11/28/2020)   PRAPARE - Administrator, Civil Service (Medical): No    Lack of Transportation (Non-Medical): No  Physical Activity: Not on file  Stress: Not on file  Social Connections: Not on file  Intimate  Partner Violence: Not on file    Review of Systems: ROS negative except for what is noted on the assessment and plan.  Vitals:   07/05/23 1050  BP: (!) 170/73  Pulse: 81  Temp: 98.4 F (36.9 C)  TempSrc: Oral  SpO2: 100%  Weight: 225 lb 6.4 oz (102.2 kg)  Height: 5\' 2"  (1.575 m)    Physical Exam: Constitutional: well-appearing female  in no acute distress HENT: normocephalic atraumatic, mucous membranes moist Eyes: conjunctiva non-erythematous Neck: supple Cardiovascular: regular rate and rhythm, no m/r/g Pulmonary/Chest: normal work of breathing on room air, lungs clear to auscultation bilaterally Abdominal: soft, non-tender, non-distended MSK: normal bulk and tone Neurological: alert & oriented x 3, 5/5 strength in bilateral upper and lower extremities, normal gait Skin: warm and dry Psych: normal mood and affect  Assessment & Plan:   Dysmenorrhea Pt with history of dysmenorrhea, previously followed by OB/GYN. At this time, she has not had a menstrual period since August. And she does not know if it's due to menopause or her history of dysmenorrhea. Previous OB/GYN recommended hysterectomy, however pt was unsure at that time and hasn't seen an OB/Gyn since 2022. Will place referral for OB/GYN  IDA (iron deficiency anemia) Likely in the setting of chronic abnormal menstrual bleeding. Last  iron panel in March of 2024 showed a Ferritin of 6 and Hb of 9.7. She has received IV infusions of iron in April and May. She also follows with oncology for management. She denies any symptoms of anemia such as dizziness or fatigue. Will recheck today and set up infusions if necessary.   HTN (hypertension) Per chart review, pt has been hypertensive at most of her office visits, however has not been on any anti-hypertensive medications. She denies any symptoms such as chest pain or shortness of breath. Will initiate losartan 25mg  at this time, and recheck in one month.   Asthma Pt with  history of asthma currently only on albuterol inhaler. She was prescribed pulmicort from the emergency department, however has been using it one puff at night and one puff in the morning. She states that her asthma gets worse when the seasons are changing currently. Denies any recent exacerbation or wheezing. Will recommend continued albuterol use PRN (she has not needed to use in the last couple weeks), and stopping the Pulmicort. Once her body adjusts to the seasons, can think about step up therapy if she finds herself using the albuterol inhaler more frequently.   Healthcare maintenance Pt with no recent lipid panel, will check today, Mammogram also ordered today.  Morbid obesity (HCC) Pt with BMI of 41.23, qualifies for morbid obesity. She is attempting to exercise as well as control her diet. She may benefit from a GLP-1 in the future.   Patient discussed with Dr. Lennon Alstrom Cali Cuartas, M.D. [facility] Internal Medicine, PGY-2 Pager: [phone] Date 07/06/2023 Time 11:57 PM

## 2023-07-07 LAB — IRON,TIBC AND FERRITIN PANEL
Ferritin: 24 ng/mL (ref 15–150)
Iron Saturation: 14 % — ABNORMAL LOW (ref 15–55)
Iron: 62 ug/dL (ref 27–159)
Total Iron Binding Capacity: 432 ug/dL (ref 250–450)
UIBC: 370 ug/dL (ref 131–425)

## 2023-07-07 LAB — LIPID PANEL
Chol/HDL Ratio: 3.8 ratio (ref 0.0–4.4)
Cholesterol, Total: 180 mg/dL (ref 100–199)
HDL: 47 mg/dL (ref >39)
LDL Chol Calc (NIH): 110 mg/dL — ABNORMAL HIGH (ref 0–99)
Triglycerides: 130 mg/dL (ref 0–149)
VLDL Cholesterol Cal: 23 mg/dL (ref 5–40)

## 2023-07-08 ENCOUNTER — Telehealth: Payer: Self-pay | Admitting: *Deleted

## 2023-07-08 NOTE — Telephone Encounter (Signed)
Called patient lvm for patient regarding her mammogram appointment 07-18-2023  @ 7:40 pm arrive  7:35 pm breast center / [address] / [phone] / message to call or cancel if not good appointment -fail to do so will be a $75  no show fee.

## 2023-07-08 NOTE — Progress Notes (Signed)
Internal Medicine Clinic Attending  Case discussed with the resident at the time of the visit.  We reviewed the resident's history and exam and pertinent patient test results.  I agree with the assessment, diagnosis, and plan of care documented in the resident's note.   At next visit, please discuss referral for colonoscopy. Patient was previously referred but this did not get scheduled.

## 2023-07-08 NOTE — Addendum Note (Signed)
Addended by: Dickie La on: 07/08/2023 12:58 PM   Modules accepted: Level of Service

## 2023-07-18 ENCOUNTER — Ambulatory Visit
Admission: RE | Admit: 2023-07-18 | Discharge: 2023-07-18 | Disposition: A | Discharge status: Home / Self Care | Payer: BC Managed Care – PPO | Source: Ambulatory Visit | Attending: Internal Medicine | Admitting: Internal Medicine

## 2023-07-18 DIAGNOSIS — N644 Mastodynia: Secondary | ICD-10-CM

## 2023-07-18 DIAGNOSIS — Z1231 Encounter for screening mammogram for malignant neoplasm of breast: Secondary | ICD-10-CM | POA: Diagnosis not present

## 2023-07-22 ENCOUNTER — Other Ambulatory Visit: Payer: Self-pay | Admitting: Internal Medicine

## 2023-07-22 DIAGNOSIS — R928 Other abnormal and inconclusive findings on diagnostic imaging of breast: Secondary | ICD-10-CM

## 2023-07-23 NOTE — Addendum Note (Signed)
Addended by: Olegario Messier on: 07/23/2023 03:28 PM   Modules accepted: Orders

## 2023-08-06 ENCOUNTER — Encounter: Payer: Self-pay | Admitting: Student

## 2023-08-06 ENCOUNTER — Ambulatory Visit (INDEPENDENT_AMBULATORY_CARE_PROVIDER_SITE_OTHER): Payer: BC Managed Care – PPO | Admitting: Student

## 2023-08-06 VITALS — BP 131/79 | HR 90 | Temp 99.1°F | Ht 62.0 in | Wt 225.8 lb

## 2023-08-06 DIAGNOSIS — R220 Localized swelling, mass and lump, head: Secondary | ICD-10-CM | POA: Insufficient documentation

## 2023-08-06 DIAGNOSIS — I10 Essential (primary) hypertension: Secondary | ICD-10-CM

## 2023-08-06 DIAGNOSIS — I159 Secondary hypertension, unspecified: Secondary | ICD-10-CM

## 2023-08-06 DIAGNOSIS — J45909 Unspecified asthma, uncomplicated: Secondary | ICD-10-CM | POA: Diagnosis not present

## 2023-08-06 DIAGNOSIS — Z6841 Body Mass Index (BMI) 40.0 and over, adult: Secondary | ICD-10-CM

## 2023-08-06 DIAGNOSIS — M7989 Other specified soft tissue disorders: Secondary | ICD-10-CM | POA: Diagnosis not present

## 2023-08-06 DIAGNOSIS — D179 Benign lipomatous neoplasm, unspecified: Secondary | ICD-10-CM | POA: Insufficient documentation

## 2023-08-06 DIAGNOSIS — R928 Other abnormal and inconclusive findings on diagnostic imaging of breast: Secondary | ICD-10-CM | POA: Insufficient documentation

## 2023-08-06 DIAGNOSIS — J4520 Mild intermittent asthma, uncomplicated: Secondary | ICD-10-CM

## 2023-08-06 DIAGNOSIS — R2242 Localized swelling, mass and lump, left lower limb: Secondary | ICD-10-CM | POA: Insufficient documentation

## 2023-08-06 NOTE — Assessment & Plan Note (Signed)
Stable.  Has only used albuterol once in the last month. -Continue albuterol as needed

## 2023-08-06 NOTE — Patient Instructions (Addendum)
For your head lump - expect a call to set up an appointment with dermatology. Try using vaseline to relieve itch.  For your leg swelling - expect a call to set up an ultrasound of the leg to rule out a deep vein thrombosis.  Continue losartan for high blood pressure.  Return in 6 months or sooner if needed.

## 2023-08-06 NOTE — Assessment & Plan Note (Signed)
Discussed diet and exercise goals.  Goal of 2-1/2 hours of cardiovascular exercise per week.  We downloaded my fitness pal and I asked her to track her food and drink intake over the next few weeks.  At subsequent visits, can evaluate her intake and make recommendations as appropriate.

## 2023-08-06 NOTE — Assessment & Plan Note (Signed)
She has concerns of a lump on the posterior scalp near the L nuchal lines.  It has been present for a few months, slowly getting larger, now itches.  On exam it is obscured by hair, mobile, solid but not hard, about the size of a dime with no skin changes.  Favor lipoma versus cyst, feel that it is unlikely to be a lymph node. - Dermatology referral for excision

## 2023-08-06 NOTE — Progress Notes (Signed)
CC: L upper leg swelling, itchy lump on head  HPI:  [name] is a 52 y.o. female living with a history stated below and presents today for above concerns and BP follow-up. Please see problem based assessment and plan for additional details.  Past Medical History:  Diagnosis Date   Anemia    Environmental allergies    Fibroid     Current Outpatient Medications on File Prior to Visit  Medication Sig Dispense Refill   albuterol (VENTOLIN HFA) 108 (90 Base) MCG/ACT inhaler Inhale 2 puffs into the lungs every 6 (six) hours as needed for wheezing or shortness of breath. 8 g 2   losartan (COZAAR) 25 MG tablet Take 1 tablet (25 mg total) by mouth daily. 90 tablet 3   No current facility-administered medications on file prior to visit.    Family History  Problem Relation Age of Onset   Breast cancer Sister     Social History   Socioeconomic History   Marital status: Married    Spouse name: Not on file   Number of children: Not on file   Years of education: Not on file   Highest education level: Not on file  Occupational History   Not on file  Tobacco Use   Smoking status: Never   Smokeless tobacco: Never  Vaping Use   Vaping status: Never Used  Substance and Sexual Activity   Alcohol use: No   Drug use: No   Sexual activity: Yes    Birth control/protection: None  Other Topics Concern   Not on file  Social History Narrative   ** Merged History Encounter **       ** Merged History Encounter **       Social Determinants of Health   Financial Resource Strain: Not on file  Food Insecurity: No Food Insecurity (11/28/2020)   Hunger Vital Sign    Worried About Running Out of Food in the Last Year: Never true    Ran Out of Food in the Last Year: Never true  Transportation Needs: No Transportation Needs (11/28/2020)   PRAPARE - Administrator, Civil Service (Medical): No    Lack of Transportation (Non-Medical): No  Physical Activity: Not on file   Stress: Not on file (07/11/2023)  Social Connections: Not on file  Intimate Partner Violence: Not on file    Review of Systems: ROS negative except for what is noted on the assessment and plan.  Vitals:   08/06/23 0933 08/06/23 0939  BP: (!) 152/85 131/79  Pulse: 88 90  Temp: 99.1 F (37.3 C)   TempSrc: Oral   SpO2: 100%   Weight: 225 lb 12.8 oz (102.4 kg)   Height: 5\' 2"  (1.575 m)     Physical Exam: Constitutional: well-appearing female sitting in chair, in no acute distress HENT: normocephalic atraumatic, mucous membranes moist Eyes: conjunctiva non-erythematous Cardiovascular: regular rate and rhythm, no m/r/g Pulmonary/Chest: normal work of breathing on room air, lungs clear to auscultation bilaterally Abdominal: soft, non-tender, non-distended MSK: normal bulk and tone.  There is swelling of the left leg that is most predominant in the lower hamstring area above the popliteal fossa, it is not pitting edema, there are no skin changes, there is mild swelling of the calf, there is only mild tenderness to palpation.  Gait, strength, ROM is unaffected. Neurological: alert & oriented x 3, no focal deficit Skin: warm and dry.  Dime sized lump on the left posterior scalp near the nuchal lines  that is firm, mobile, and without overlying skin changes. Psych: normal mood and behavior  Assessment & Plan:   Patient seen with Dr. Antony Contras  Lump of scalp She has concerns of a lump on the posterior scalp near the L nuchal lines.  It has been present for a few months, slowly getting larger, now itches.  On exam it is obscured by hair, mobile, solid but not hard, about the size of a dime with no skin changes.  Favor lipoma versus cyst, feel that it is unlikely to be a lymph node. - Dermatology referral for excision  Left leg swelling There is nonedematous swelling of the left posterior thigh above the popliteal fossa and some associated swelling of the general upper and lower leg.  It is  firm and only mildly tender.  No skin changes.  No changes on the right leg.  Progressive over 1 to 2 months.  No weakness, no trauma or accidents, no recent travel or inactivity, patient does not smoke or take estrogen.  I am not really sure what this is, pain is minimal, upper leg location is unusual, only risk factor for DVT appears to be obesity (for concern of hypercoagulability, it is worth noting that she will have a diagnostic US tomorrow for a lump in the breast discovered at recent mammogram), history does not support any sort of overuse injury. - Left leg ultrasound to rule out DVT.    Morbid obesity (HCC) Discussed diet and exercise goals.  Goal of 2-1/2 hours of cardiovascular exercise per week.  We downloaded my fitness pal and I asked her to track her food and drink intake over the next few weeks.  At subsequent visits, can evaluate her intake and make recommendations as appropriate.  Asthma Stable.  Has only used albuterol once in the last month. -Continue albuterol as needed  HTN (hypertension) At goal.  131/79. - Continue losartan 25 daily  Abnormal mammogram She will undergo diagnostic mammogram of R breast tomorrow. Per recent screening on 07/18/23:  In the right breast, possible distortion warrants further evaluation. In the left breast, no findings suspicious for malignancy.  Katheran James, D.O. [facility] Internal Medicine, PGY-1 Phone: [phone] Date 08/06/2023 Time 1:48 PM

## 2023-08-06 NOTE — Assessment & Plan Note (Signed)
There is nonedematous swelling of the left posterior thigh above the popliteal fossa and some associated swelling of the general upper and lower leg.  It is firm and only mildly tender.  No skin changes.  No changes on the right leg.  Progressive over 1 to 2 months.  No weakness, no trauma or accidents, no recent travel or inactivity, patient does not smoke or take estrogen.  I am not really sure what this is, pain is minimal, upper leg location is unusual, only risk factor for DVT appears to be obesity (for concern of hypercoagulability, it is worth noting that she will have a diagnostic US tomorrow for a lump in the breast discovered at recent mammogram), history does not support any sort of overuse injury. - Left leg ultrasound to rule out DVT.

## 2023-08-06 NOTE — Assessment & Plan Note (Signed)
At goal.  131/79. - Continue losartan 25 daily

## 2023-08-06 NOTE — Progress Notes (Signed)
Internal Medicine Clinic Attending  I was physically present during the key portions of the resident provided service and participated in the medical decision making of patient's management care. I reviewed pertinent patient test results.  The assessment, diagnosis, and plan were formulated together and I agree with the documentation in the resident's note.  Reymundo Poll, MD

## 2023-08-06 NOTE — Assessment & Plan Note (Signed)
She will undergo diagnostic mammogram of R breast tomorrow. Per recent screening on 07/18/23:  In the right breast, possible distortion warrants further evaluation. In the left breast, no findings suspicious for malignancy.

## 2023-08-07 ENCOUNTER — Ambulatory Visit
Admission: RE | Admit: 2023-08-07 | Discharge: 2023-08-07 | Disposition: A | Discharge status: Home / Self Care | Payer: BC Managed Care – PPO | Source: Ambulatory Visit | Attending: Internal Medicine

## 2023-08-07 ENCOUNTER — Ambulatory Visit: Payer: BC Managed Care – PPO

## 2023-08-07 DIAGNOSIS — R928 Other abnormal and inconclusive findings on diagnostic imaging of breast: Secondary | ICD-10-CM | POA: Diagnosis not present

## 2023-08-09 ENCOUNTER — Other Ambulatory Visit: Payer: BC Managed Care – PPO

## 2023-08-10 ENCOUNTER — Other Ambulatory Visit: Payer: Self-pay | Admitting: Nurse Practitioner

## 2023-08-10 DIAGNOSIS — D500 Iron deficiency anemia secondary to blood loss (chronic): Secondary | ICD-10-CM

## 2023-08-13 ENCOUNTER — Ambulatory Visit
Admission: RE | Admit: 2023-08-13 | Discharge: 2023-08-13 | Disposition: A | Discharge status: Home / Self Care | Payer: BC Managed Care – PPO | Source: Ambulatory Visit | Attending: Internal Medicine

## 2023-08-13 DIAGNOSIS — R2242 Localized swelling, mass and lump, left lower limb: Secondary | ICD-10-CM | POA: Diagnosis not present

## 2023-08-13 DIAGNOSIS — M7989 Other specified soft tissue disorders: Secondary | ICD-10-CM

## 2023-08-13 NOTE — Telephone Encounter (Signed)
Requested Prescriptions  Pending Prescriptions Disp Refills   ferrous sulfate 325 (65 FE) MG EC tablet [Pharmacy Med Name: FERROUS SULF EC 325 MG TABLET] 180 tablet 1    Sig: TAKE 1 TABLET BY MOUTH TWICE A DAY     Endocrinology:  Minerals - Iron Supplementation Failed - 08/10/2023  9:52 PM      Failed - HGB in normal range and within 360 days    Hemoglobin  Date Value Ref Range Status  11/21/2022 9.6 (L) 12.0 - 15.0 g/dL Final    Comment:    Reticulocyte Hemoglobin testing may be clinically indicated, consider ordering this additional test [id number]   09/19/2022 7.0 (LL) 11.1 - 15.9 g/dL Final         Failed - HCT in normal range and within 360 days    HCT  Date Value Ref Range Status  11/21/2022 33.4 (L) 36.0 - 46.0 % Final   Hematocrit  Date Value Ref Range Status  09/19/2022 26.9 (L) 34.0 - 46.6 % Final         Failed - RBC in normal range and within 360 days    RBC  Date Value Ref Range Status  11/21/2022 5.36 (H) 3.87 - 5.11 MIL/uL Final         Passed - Fe (serum) in normal range and within 360 days    Iron  Date Value Ref Range Status  07/05/2023 62 27 - 159 ug/dL Final   Iron Saturation  Date Value Ref Range Status  07/05/2023 14 (L) 15 - 55 % Final         Passed - Ferritin in normal range and within 360 days    Ferritin  Date Value Ref Range Status  07/05/2023 24 15 - 150 ng/mL Final         Passed - Valid encounter within last 12 months    Recent Outpatient Visits           4 months ago Screening breast examination   [facility] [place]. [facility] [place], Marzella Schlein, [place]   8 months ago Encounter for annual physical exam   [facility] [place]. [facility] Claiborne Rigg, NP   10 months ago Encounter to establish care   [facility] [place]. [facility] Claiborne Rigg, NP   3 years ago Menorrhagia with irregular  cycle   [facility] [place]. [facility] Marcine Matar, MD   3 years ago Pre-diabetes   [facility] [place]. [facility] Hoy Register, MD

## 2023-08-20 ENCOUNTER — Telehealth: Payer: Self-pay | Admitting: Hematology and Oncology

## 2023-08-20 ENCOUNTER — Telehealth: Payer: Self-pay | Admitting: *Deleted

## 2023-08-20 NOTE — Telephone Encounter (Signed)
 Left patient a vm regarding upcoming appointment

## 2023-08-23 ENCOUNTER — Ambulatory Visit: Payer: BC Managed Care – PPO | Admitting: Hematology and Oncology

## 2023-08-24 ENCOUNTER — Encounter: Payer: Self-pay | Admitting: Student

## 2023-08-30 ENCOUNTER — Encounter: Payer: Self-pay | Admitting: Hematology and Oncology

## 2023-08-30 NOTE — Telephone Encounter (Signed)
No entry 

## 2023-09-11 ENCOUNTER — Telehealth: Payer: Self-pay | Admitting: Hematology and Oncology

## 2023-09-11 NOTE — Telephone Encounter (Signed)
 Spoke with patient confirming upcoming appointment change

## 2023-09-17 DIAGNOSIS — D23111 Other benign neoplasm of skin of right upper eyelid, including canthus: Secondary | ICD-10-CM | POA: Diagnosis not present

## 2023-09-20 ENCOUNTER — Ambulatory Visit: Payer: BC Managed Care – PPO | Admitting: Hematology and Oncology

## 2023-09-24 ENCOUNTER — Encounter: Payer: Self-pay | Admitting: Hematology and Oncology

## 2023-09-24 ENCOUNTER — Inpatient Hospital Stay: Payer: BC Managed Care – PPO | Attending: Hematology and Oncology | Admitting: Hematology and Oncology

## 2023-09-24 VITALS — BP 149/85 | HR 90 | Temp 98.6°F | Resp 17 | Wt 222.2 lb

## 2023-09-24 DIAGNOSIS — I10 Essential (primary) hypertension: Secondary | ICD-10-CM | POA: Diagnosis not present

## 2023-09-24 DIAGNOSIS — E7800 Pure hypercholesterolemia, unspecified: Secondary | ICD-10-CM | POA: Insufficient documentation

## 2023-09-24 DIAGNOSIS — M7989 Other specified soft tissue disorders: Secondary | ICD-10-CM | POA: Diagnosis not present

## 2023-09-24 DIAGNOSIS — N920 Excessive and frequent menstruation with regular cycle: Secondary | ICD-10-CM | POA: Insufficient documentation

## 2023-09-24 DIAGNOSIS — D500 Iron deficiency anemia secondary to blood loss (chronic): Secondary | ICD-10-CM | POA: Diagnosis not present

## 2023-09-24 NOTE — Progress Notes (Signed)
[facility] CONSULT NOTE  Patient Care Team: Olegario Messier, MD as PCP - General (Internal Medicine)  CHIEF COMPLAINTS/PURPOSE OF CONSULTATION:  IDA  ASSESSMENT & PLAN:   This is a pleasant 53 year old female patient referred to hematology for IDA secondary to menstrual blood loss.   Hypertension Uncontrolled despite medication. Patient self-adjusted medication dosage from 1 to 4 tablets daily due to persistent high readings. -Inform primary care physician about the change in medication dosage. -Advise patient to continue monitoring blood pressure daily and record in a book for physician review.  Menorrhagia and resulting IDA Persistent heavy menstrual bleeding. No current use of hormonal contraceptives. -Order blood work to assess iron levels. -If iron levels are low, consider additional iron supplementation.  Unilateral Lower Extremity Swelling Patient reports persistent swelling in one leg. No pain reported. Ultrasound previously performed showed no blood clot. -Communicate with primary care physician regarding the swelling and possible differential diagnoses, including elephantiasis given the fact that she is from [place]. Once again, I defer this work up to PCP.  General Health Maintenance / Followup Plans -Return visit in six months, or sooner if iron supplementation is needed based on blood work results.  HISTORY OF PRESENTING ILLNESS:   Karen Gibbs 53 y.o. female is here because of severe IDA.  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  Discussed the use of AI scribe software for clinical note transcription with the patient, who gave verbal consent to proceed.  History of Present Illness         The patient, with a history of iron deficiency anemia and high cholesterol, presents with high blood pressure and heavy menstrual bleeding. She  reports that her blood pressure is high every day despite being on medication. She has been taking four pills a day instead of the prescribed one pill a day since Saturday, which has helped to manage her symptoms. She also keeps a record of her daily blood pressure readings.  In addition to high blood pressure, the patient is experiencing heavy menstrual bleeding that lasts for two weeks. She is not on any birth control and reports that any attempts to control the bleeding with medication have been unsuccessful.  The patient also reports a new issue of swelling in her right leg. She describes a sensation of something moving inside her leg and has noticed that her skin has changed. She has been wearing a stocking and applying ice as recommended by her doctor, but the swelling persists. She has had an ultrasound which ruled out a blood clot.   MEDICAL HISTORY:  Past Medical History:  Diagnosis Date   Anemia    Environmental allergies    Fibroid     SURGICAL HISTORY: Past Surgical History:  Procedure Laterality Date   CESAREAN SECTION     C/S x 2    SOCIAL HISTORY: Social History   Socioeconomic History   Marital status: Married    Spouse name: Not on file   Number of children: Not on file   Years of education: Not on file   Highest education level: Not on file  Occupational History   Not on file  Tobacco Use   Smoking status: Never   Smokeless tobacco: Never  Vaping Use   Vaping status: Never Used  Substance and Sexual Activity   Alcohol use: No   Drug use: No   Sexual activity:  Yes    Birth control/protection: None  Other Topics Concern   Not on file  Social History Narrative   ** Merged History Encounter **       ** Merged History Encounter **       Social Drivers of Corporate investment banker Strain: Not on file  Food Insecurity: No Food Insecurity (11/28/2020)   Hunger Vital Sign    Worried About Running Out of Food in the Last Year: Never true    Ran Out of  Food in the Last Year: Never true  Transportation Needs: No Transportation Needs (11/28/2020)   PRAPARE - Administrator, Civil Service (Medical): No    Lack of Transportation (Non-Medical): No  Physical Activity: Not on file  Stress: Not on file (07/11/2023)  Social Connections: Not on file  Intimate Partner Violence: Not on file    FAMILY HISTORY: Family History  Problem Relation Age of Onset   Breast cancer Sister     ALLERGIES:  has no known allergies.  MEDICATIONS:  Current Outpatient Medications  Medication Sig Dispense Refill   albuterol (VENTOLIN HFA) 108 (90 Base) MCG/ACT inhaler Inhale 2 puffs into the lungs every 6 (six) hours as needed for wheezing or shortness of breath. 8 g 2   losartan (COZAAR) 25 MG tablet Take 1 tablet (25 mg total) by mouth daily. 90 tablet 3   No current facility-administered medications for this visit.     PHYSICAL EXAMINATION: ECOG PERFORMANCE STATUS: 0 - Asymptomatic  Vitals:   09/24/23 1018 09/24/23 1019  BP: (!) 149/86 (!) 149/85  Pulse: 90   Resp: 17   Temp: 98.6 F (37 C)   SpO2: 98%    Filed Weights   09/24/23 1018  Weight: 222 lb 3.2 oz (100.8 kg)    GENERAL:alert, no distress and comfortable Left leg visibly bigger than right.   LABORATORY DATA:  I have reviewed the data as listed Lab Results  Component Value Date   WBC 9.3 11/21/2022   HGB 9.6 (L) 11/21/2022   HCT 33.4 (L) 11/21/2022   MCV 62.3 (L) 11/21/2022   PLT 335 11/21/2022     Chemistry      Component Value Date/Time   NA 139 09/19/2022 1637   K 4.2 09/19/2022 1637   CL 104 09/19/2022 1637   CO2 22 09/19/2022 1637   BUN 9 09/19/2022 1637   CREATININE 0.49 (L) 09/19/2022 1637   CREATININE 0.50 10/08/2014 1256      Component Value Date/Time   CALCIUM 9.2 09/19/2022 1637   ALKPHOS 53 09/19/2022 1637   AST 17 09/19/2022 1637   ALT 10 09/19/2022 1637   BILITOT 0.4 09/19/2022 1637       RADIOGRAPHIC STUDIES: I have personally  reviewed the radiological images as listed and agreed with the findings in the report. No results found.  All questions were answered. The patient knows to call the clinic with any problems, questions or concerns. I spent 30 minutes in the care of this patient including H and P, review of records, counseling and coordination of care.     Rachel Moulds, MD 09/24/2023 10:21 AM

## 2023-09-25 ENCOUNTER — Telehealth: Payer: Self-pay | Admitting: Hematology and Oncology

## 2023-09-25 NOTE — Telephone Encounter (Signed)
 Left patient a vm regarding upcoming appointment

## 2023-10-02 DIAGNOSIS — D23111 Other benign neoplasm of skin of right upper eyelid, including canthus: Secondary | ICD-10-CM | POA: Diagnosis not present

## 2023-10-04 ENCOUNTER — Ambulatory Visit: Payer: BC Managed Care – PPO | Admitting: Student

## 2023-10-04 VITALS — BP 145/86 | HR 77 | Temp 97.8°F | Ht 62.0 in | Wt 224.5 lb

## 2023-10-04 DIAGNOSIS — D509 Iron deficiency anemia, unspecified: Secondary | ICD-10-CM | POA: Diagnosis not present

## 2023-10-04 DIAGNOSIS — I159 Secondary hypertension, unspecified: Secondary | ICD-10-CM | POA: Diagnosis not present

## 2023-10-04 DIAGNOSIS — M7989 Other specified soft tissue disorders: Secondary | ICD-10-CM | POA: Diagnosis not present

## 2023-10-04 NOTE — Assessment & Plan Note (Signed)
Patient presents with worsening left lower extremity edema that is primarily located on her posterior left thigh.  Patient denied any pain or discomfort, history of any trauma or injury to the left leg, or improvement with the edema while she elevates the leg.  Physical exam does reveal a nonpitting edematous left posterior thigh, I was unable to appreciate any focal fluctuance or mass on exam.  Patient relocated from [place] 10 years ago and does have some concern for elephantitis.  Elephantitis is low on my differential at this moment, her edema is concentrated on the left posterior thigh and does not extend down to the foot. Plan: -CBC with differential for eosinophilia -Ultrasound of left posterior thigh, prior ultrasound for DVT in December was negative for clots

## 2023-10-04 NOTE — Progress Notes (Signed)
Established Patient Office Visit  Subjective   Patient ID: Karen Gibbs, female    DOB: 07/20/1971  Age: 53 y.o. MRN: [id number]  Chief Complaint  Patient presents with   Leg Swelling    Back leg swelling    Hypertension    Desmond Tufano is a 53 y.o. who presents to the clinic for leg swelling and hypertension follow-up. Please see problem based assessment and plan for additional details.  Patient Active Problem List   Diagnosis Date Noted   Lump of scalp 08/06/2023   Left leg swelling 08/06/2023   Abnormal mammogram 08/06/2023   HTN (hypertension) 07/06/2023   Asthma 07/06/2023   Healthcare maintenance 07/06/2023   Morbid obesity (HCC) 07/06/2023   IDA (iron deficiency anemia) 11/21/2022   Fibroids 11/28/2020   Language barrier 07/25/2018   Menorrhagia with regular cycle 11/25/2014   Dysmenorrhea 11/25/2014      Objective:     BP (!) 145/86 (BP Location: Right Arm, Patient Position: Sitting, Cuff Size: Large)   Pulse 77   Temp 97.8 F (36.6 C) (Oral)   Ht 5\' 2"  (1.575 m)   Wt 224 lb 8 oz (101.8 kg)   SpO2 100%   BMI 41.06 kg/m  BP Readings from Last 3 Encounters:  10/04/23 (!) 145/86  09/24/23 (!) 149/85  08/06/23 131/79   Wt Readings from Last 3 Encounters:  10/04/23 224 lb 8 oz (101.8 kg)  09/24/23 222 lb 3.2 oz (100.8 kg)  08/06/23 225 lb 12.8 oz (102.4 kg)      Physical Exam Vitals reviewed.  Constitutional:      General: She is not in acute distress.    Appearance: She is not ill-appearing, toxic-appearing or diaphoretic.  Cardiovascular:     Rate and Rhythm: Normal rate and regular rhythm.     Heart sounds: No murmur heard. Pulmonary:     Effort: Pulmonary effort is normal. No respiratory distress.     Breath sounds: Normal breath sounds. No stridor. No wheezing or rhonchi.  Musculoskeletal:     Left lower leg: Edema (Left lower extremity swelling predominantly in the posterior thigh, patient's edema is not pitting, not tender to  palpate,) present.     Comments: Unable to appreciate fluctuation or mass  Skin:    General: Skin is warm and dry.  Neurological:     Mental Status: She is alert.  Psychiatric:        Mood and Affect: Mood normal.        Behavior: Behavior normal.      No results found for any visits on 10/04/23.  Last CBC Lab Results  Component Value Date   WBC 9.3 11/21/2022   HGB 9.6 (L) 11/21/2022   HCT 33.4 (L) 11/21/2022   MCV 62.3 (L) 11/21/2022   MCH 17.9 (L) 11/21/2022   RDW 28.2 (H) 11/21/2022   PLT 335 11/21/2022   Last metabolic panel Lab Results  Component Value Date   GLUCOSE 85 09/19/2022   NA 139 09/19/2022   K 4.2 09/19/2022   CL 104 09/19/2022   CO2 22 09/19/2022   BUN 9 09/19/2022   CREATININE 0.49 (L) 09/19/2022   EGFR 115 09/19/2022   CALCIUM 9.2 09/19/2022   PROT 6.7 09/19/2022   ALBUMIN 3.9 09/19/2022   LABGLOB 2.8 09/19/2022   AGRATIO 1.4 09/19/2022   BILITOT 0.4 09/19/2022   ALKPHOS 53 09/19/2022   AST 17 09/19/2022   ALT 10 09/19/2022   Last hemoglobin A1c Lab Results  Component  Value Date   HGBA1C 5.6 09/19/2022      The 10-year ASCVD risk score (Arnett DK, et al., 2019) is: 6.7%    Assessment & Plan:   Problem List Items Addressed This Visit       Cardiovascular and Mediastinum   HTN (hypertension) - Primary   Patient presents with uncontrolled hypertension on a regiment of losartan.  Her blood pressure was 148/98 today.  Patient was prescribed 25 mg of losartan, she reported that she has been taking up to 4 tablets daily because her blood pressure was elevated.  Over the past week, patient has been taking 50 mg losartan daily.  She has not taken 4 tablets of losartan since Sunday.  Patient was instructed to not increase her medication without speaking to the clinic staff and that there is risk of increase of kidney injury. Plan: -BMP ordered -If patient's BMP is stable, will discontinue losartan and begin combination pill of valsartan  hydrochlorothiazide 80-12.5 mg daily      Relevant Orders   Basic metabolic panel     Other   IDA (iron deficiency anemia)   Relevant Orders   CBC with Diff   Left leg swelling   Patient presents with worsening left lower extremity edema that is primarily located on her posterior left thigh.  Patient denied any pain or discomfort, history of any trauma or injury to the left leg, or improvement with the edema while she elevates the leg.  Physical exam does reveal a nonpitting edematous left posterior thigh, I was unable to appreciate any focal fluctuance or mass on exam.  Patient relocated from [place] 10 years ago and does have some concern for elephantitis.  Elephantitis is low on my differential at this moment, her edema is concentrated on the left posterior thigh and does not extend down to the foot. Plan: -CBC with differential for eosinophilia -Ultrasound of left posterior thigh, prior ultrasound for DVT in December was negative for clots      Relevant Orders   CBC with Diff   [place] LT LOWER EXTREM LTD SOFT TISSUE NON VASCULAR    Return in about 4 weeks (around 11/01/2023) for BP, LLE edema .    Faith Rogue, DO

## 2023-10-04 NOTE — Assessment & Plan Note (Signed)
Patient presents with uncontrolled hypertension on a regiment of losartan.  Her blood pressure was 148/98 today.  Patient was prescribed 25 mg of losartan, she reported that she has been taking up to 4 tablets daily because her blood pressure was elevated.  Over the past week, patient has been taking 50 mg losartan daily.  She has not taken 4 tablets of losartan since Sunday.  Patient was instructed to not increase her medication without speaking to the clinic staff and that there is risk of increase of kidney injury. Plan: -BMP ordered -If patient's BMP is stable, will discontinue losartan and begin combination pill of valsartan hydrochlorothiazide 80-12.5 mg daily

## 2023-10-04 NOTE — Patient Instructions (Signed)
Thank you, Ms.Juvia Dearing for allowing [place] to provide your care today. Today we discussed pretension and left leg swelling.  We will check your kidney function, if your kidney function looks good we will stop the losartan and add a new medication that has valsartan 80 mg and hydrochlorothiazide 12.5 mg and 1 pill.  Please do not take more than 1 pill/day, if your blood pressure remains high please call the clinic.  Taking medications incorrectly can cause damage to your kidneys.  I have ordered the following labs for you:   Lab Orders         Basic metabolic panel         CBC with Diff     An ultrasound of your left leg  I have ordered the following medication/changed the following medications:   Stop the following medications: There are no discontinued medications.   Start the following medications: No orders of the defined types were placed in this encounter.    Follow up: 1 month, leg edema, BP   Should you have any questions or concerns please call the internal medicine clinic at [phone].     Please note that our late policy has changed.  If you are more than 15 minutes late to your appointment, you may be asked to reschedule your appointment.  Dr. Hessie Diener, D.O. [facility]

## 2023-10-06 LAB — CBC WITH DIFFERENTIAL/PLATELET
Basophils Absolute: 0 10*3/uL (ref 0.0–0.2)
Basos: 1 %
EOS (ABSOLUTE): 0.1 10*3/uL (ref 0.0–0.4)
Eos: 3 %
Hematocrit: 38.5 % (ref 34.0–46.6)
Hemoglobin: 11.6 g/dL (ref 11.1–15.9)
Immature Grans (Abs): 0 10*3/uL (ref 0.0–0.1)
Immature Granulocytes: 0 %
Lymphocytes Absolute: 2.5 10*3/uL (ref 0.7–3.1)
Lymphs: 50 %
MCH: 23.2 pg — ABNORMAL LOW (ref 26.6–33.0)
MCHC: 30.1 g/dL — ABNORMAL LOW (ref 31.5–35.7)
MCV: 77 fL — ABNORMAL LOW (ref 79–97)
Monocytes Absolute: 0.4 10*3/uL (ref 0.1–0.9)
Monocytes: 8 %
Neutrophils Absolute: 1.9 10*3/uL (ref 1.4–7.0)
Neutrophils: 38 %
Platelets: 311 10*3/uL (ref 150–450)
RBC: 4.99 x10E6/uL (ref 3.77–5.28)
RDW: 15 % (ref 11.7–15.4)
WBC: 5 10*3/uL (ref 3.4–10.8)

## 2023-10-06 LAB — BASIC METABOLIC PANEL
BUN/Creatinine Ratio: 15 (ref 9–23)
BUN: 9 mg/dL (ref 6–24)
CO2: 22 mmol/L (ref 20–29)
Calcium: 9.6 mg/dL (ref 8.7–10.2)
Chloride: 103 mmol/L (ref 96–106)
Creatinine, Ser: 0.6 mg/dL (ref 0.57–1.00)
Glucose: 86 mg/dL (ref 70–99)
Potassium: 4.3 mmol/L (ref 3.5–5.2)
Sodium: 139 mmol/L (ref 134–144)
eGFR: 109 mL/min/{1.73_m2} (ref >59)

## 2023-10-07 ENCOUNTER — Other Ambulatory Visit: Payer: Self-pay | Admitting: Student

## 2023-10-07 MED ORDER — VALSARTAN-HYDROCHLOROTHIAZIDE 80-12.5 MG PO TABS: 1.0000 | ORAL_TABLET | Freq: Every day | ORAL | 3 refills | Status: DC

## 2023-10-29 ENCOUNTER — Ambulatory Visit (HOSPITAL_COMMUNITY): Payer: BC Managed Care – PPO

## 2023-11-01 ENCOUNTER — Ambulatory Visit (HOSPITAL_COMMUNITY)
Admission: RE | Admit: 2023-11-01 | Discharge: 2023-11-01 | Disposition: A | Discharge status: Home / Self Care | Payer: BC Managed Care – PPO | Source: Ambulatory Visit | Attending: Internal Medicine | Admitting: Internal Medicine

## 2023-11-01 ENCOUNTER — Other Ambulatory Visit: Payer: Self-pay | Admitting: Student

## 2023-11-01 DIAGNOSIS — M7989 Other specified soft tissue disorders: Secondary | ICD-10-CM

## 2023-11-01 DIAGNOSIS — R2242 Localized swelling, mass and lump, left lower limb: Secondary | ICD-10-CM | POA: Diagnosis not present

## 2023-11-04 ENCOUNTER — Ambulatory Visit: Payer: BC Managed Care – PPO | Admitting: Student

## 2023-11-04 VITALS — BP 148/93 | HR 83 | Temp 97.7°F | Ht 62.0 in | Wt 222.4 lb

## 2023-11-04 DIAGNOSIS — R2242 Localized swelling, mass and lump, left lower limb: Secondary | ICD-10-CM | POA: Diagnosis not present

## 2023-11-04 DIAGNOSIS — R220 Localized swelling, mass and lump, head: Secondary | ICD-10-CM | POA: Diagnosis not present

## 2023-11-04 DIAGNOSIS — I10 Essential (primary) hypertension: Secondary | ICD-10-CM | POA: Diagnosis not present

## 2023-11-04 NOTE — Progress Notes (Signed)
   CC: Routine Follow Up for hypertension and left thigh mass after last office visit 10/04/2023  HPI:  Karen Gibbs is a 53 y.o. female with pertinent PMH of HTN, asthma, class III obesity, and new mass found in posterior left thigh muscles who presents as above. Please see assessment and plan below for further details.  Review of Systems:   Pertinent items noted in HPI and/or A&P.  Physical Exam:  Vitals:   11/04/23 0909 11/04/23 0947  BP: (!) 138/93 (!) 148/93  Pulse: 82 83  Temp: 97.7 F (36.5 C)   TempSrc: Oral   SpO2: 99%   Weight: 222 lb 6.4 oz (100.9 kg)   Height: 5\' 2"  (1.575 m)     Constitutional: Well-appearing adult female. In no acute distress. Pulm:Normal work of breathing on room air. Neuro:Alert and oriented x3. No focal deficit noted. Psych:Pleasant mood and affect.   Assessment & Plan:   HTN (hypertension) Patient returns for follow-up after starting combination pill valsartan and hydrochlorothiazide.  She was previously taking anywhere between 25 and 100 mg of losartan but mostly 50 mg daily.  She was started on valsartan-hydrochlorothiazide 80-12.5 mg daily and a BMP at that visit showed normal electrolytes and renal function.  Blood pressure today 138/93 which is slightly improved from prior but also in the setting of discussing a new mass found in her left thigh. - Continue Valsartan-hydrochlorothiazide 80-12.5 mg daily - Repeat BMP today  Mass of left thigh Patient has had nonpitting edema on the left lower extremity with ultrasound showing a 21 cm mass in the posterior thigh muscles concerning for neoplasm.  MRI has been ordered for further evaluation.  She will almost certainly need a biopsy but will wait on MRI before referring to IR.  Discussed these findings and this plan with the patient and her daughter today.  They are aware that this could be a malignancy. - Follow-up MRI left thigh, likely needs IR referral after this  Lump of scalp Patient  continues to have bothersome lump on the left posterior scalp that is about half centimeter mobile mass consistent with an epidermal inclusion cyst.  She was referred to dermatology at a previous visit and I have provided her the information for their office to call.    Patient discussed with Dr. Theodosia Paling, DO [facility] Internal Medicine Resident PGY-2 Clinic Phone: [phone] Pager: [phone]

## 2023-11-04 NOTE — Assessment & Plan Note (Signed)
 Patient returns for follow-up after starting combination pill valsartan and hydrochlorothiazide.  She was previously taking anywhere between 25 and 100 mg of losartan but mostly 50 mg daily.  She was started on valsartan-hydrochlorothiazide 80-12.5 mg daily and a BMP at that visit showed normal electrolytes and renal function.  Blood pressure today 138/93 which is slightly improved from prior but also in the setting of discussing a new mass found in her left thigh. - Continue Valsartan-hydrochlorothiazide 80-12.5 mg daily - Repeat BMP today

## 2023-11-04 NOTE — Assessment & Plan Note (Signed)
 Patient has had nonpitting edema on the left lower extremity with ultrasound showing a 21 cm mass in the posterior thigh muscles concerning for neoplasm.  MRI has been ordered for further evaluation.  She will almost certainly need a biopsy but will wait on MRI before referring to IR.  Discussed these findings and this plan with the patient and her daughter today.  They are aware that this could be a malignancy. - Follow-up MRI left thigh, likely needs IR referral after this

## 2023-11-04 NOTE — Patient Instructions (Signed)
  Thank you, Ms.Chanie Carver, for allowing [place] to provide your care today. Today we discussed . . .  > Thigh mass       -I am going to send a message to our front desk to help expedite your MRI.  Once we have the MRI images we will be able to talk to the interventional radiologists who do biopsies and get you set up for a biopsy.  If you have not been contacted by the radiologist office to schedule your MRI at the end of the week please let us know. > Scalp lump       -I have included the information for the dermatology office below.  I think calling them is the quickest way to get an appointment.  I think the lump on the back of your head is an epidermal inclusion cyst or something similar that could be removed in the dermatology office.  [facility] [place] Address: [address], [place], [place] [address]  Phone: [phone]  I have ordered the following labs for you:   Lab Orders         BMP8+Anion Gap      Follow up: 2 months    Remember:     Should you have any questions or concerns please call the internal medicine clinic at [phone].     Rocky Morel, DO [facility]

## 2023-11-04 NOTE — Assessment & Plan Note (Signed)
 Patient continues to have bothersome lump on the left posterior scalp that is about half centimeter mobile mass consistent with an epidermal inclusion cyst.  She was referred to dermatology at a previous visit and I have provided her the information for their office to call.

## 2023-11-05 LAB — BMP8+ANION GAP
Anion Gap: 15 mmol/L (ref 10.0–18.0)
BUN/Creatinine Ratio: 17 (ref 9–23)
BUN: 10 mg/dL (ref 6–24)
CO2: 24 mmol/L (ref 20–29)
Calcium: 9 mg/dL (ref 8.7–10.2)
Chloride: 103 mmol/L (ref 96–106)
Creatinine, Ser: 0.6 mg/dL (ref 0.57–1.00)
Glucose: 100 mg/dL — ABNORMAL HIGH (ref 70–99)
Potassium: 3.9 mmol/L (ref 3.5–5.2)
Sodium: 142 mmol/L (ref 134–144)
eGFR: 109 mL/min/{1.73_m2} (ref >59)

## 2023-11-08 ENCOUNTER — Telehealth: Payer: Self-pay | Admitting: Internal Medicine

## 2023-11-08 NOTE — Telephone Encounter (Signed)
 Called pt and no answer, to give her the following appointment that has been resubmitted and Authorized  and Scheduled for the following:   Name: Karen Gibbs, Mcclenney MRN: [id number]  Date: 11/11/2023 Status: Sch  Time: 3:00 PM Length: 60  Visit Type: MR FEMUR LEFT W WO CONTRAST [[id number]] Copay: $0.00  Provider: [facility] Department: [facility]  Referring Provider: Mercie Eon CSN: [id number]  Notes: 03/07 s/w office and gave instructions -TR  Made On: Change Notes: 11/08/2023 1:33 PM 11/08/2023 1:34 PM By: By: Brayton El   Appointment Instructions   Visit Type: MR FEMUR LEFT W WO CONTRAST  Patient Instructions: MR FEMUR LEFT W WO CONTRAST To reschedule, cancel, or questions concerning your appointment call radiology scheduling at [phone]. Please arrive 15 minutes prior to appointment time unless lab work is required; If labs required arrive 45 minutes prior to the appointment time.

## 2023-11-11 ENCOUNTER — Other Ambulatory Visit (HOSPITAL_COMMUNITY)

## 2023-11-11 ENCOUNTER — Telehealth: Payer: Self-pay | Admitting: Student

## 2023-11-11 NOTE — Progress Notes (Signed)
 Internal Medicine Clinic Attending  Case discussed with the resident at the time of the visit.  We reviewed the resident's history and exam and pertinent patient test results.  I agree with the assessment, diagnosis, and plan of care documented in the resident's note.

## 2023-11-11 NOTE — Telephone Encounter (Signed)
 You completed this message for P Imp Admin.   CRM # [id number] Owner: None Status: Resolved Open  Priority: Routine Created on: 11/08/2023 03:10 PM By: Bradly Chris   Primary Information  Source  Karen Gibbs (Patient)   Subject  Karen Gibbs (Patient)   Topic  General - Other    Communication  Reason for CRM: Patient Retrungin office call. In notes in regard to schduling app. Tried call ofifce 2 times no answer. Patient needs call back or message thru MyChart ok    [phone]      Called pt back LMOM. Pt rtn from 11/08/2023 in regards to her MRI that was scheduled for 11/11/2023.  Per Radiology the patient has already called and resch her own appt to the following day:   Name: Karen Gibbs, Karen Gibbs MRN: [id number]  Date: 11/11/2023 Status: Can  Time: 3:00 PM Length: 60  Visit Type: MR FEMUR LEFT W WO CONTRAST [[id number]] Copay: $0.00  Provider: [facility] Department: [facility]  Referring Provider: Mercie Eon CSN: [id number]  Notes: 03/07 s/w office and gave instructions -TR  Made On: Change Notes: Canceled: 11/08/2023 1:33 PM 11/08/2023 1:34 PM 11/08/2023 1:48 PM By: By: By: Esau Grew, COLETTE C  Cancel Rsn: Patient

## 2023-11-20 ENCOUNTER — Ambulatory Visit (HOSPITAL_COMMUNITY)
Admission: RE | Admit: 2023-11-20 | Discharge: 2023-11-20 | Disposition: A | Discharge status: Home / Self Care | Source: Ambulatory Visit | Attending: Internal Medicine | Admitting: Internal Medicine

## 2023-11-20 DIAGNOSIS — R2242 Localized swelling, mass and lump, left lower limb: Secondary | ICD-10-CM | POA: Diagnosis not present

## 2023-11-20 MED ORDER — GADOBUTROL 1 MMOL/ML IV SOLN
10.0000 mL | Freq: Once | INTRAVENOUS | Status: AC | PRN
  Administered 2023-11-20: 10 mL via INTRAVENOUS

## 2023-11-25 ENCOUNTER — Other Ambulatory Visit: Payer: Self-pay | Admitting: Student

## 2023-11-25 DIAGNOSIS — R2242 Localized swelling, mass and lump, left lower limb: Secondary | ICD-10-CM

## 2023-11-29 ENCOUNTER — Telehealth: Payer: Self-pay | Admitting: Student

## 2023-11-29 NOTE — Telephone Encounter (Signed)
 Copied from CRM [id number]. Topic: Referral - Question >> Nov 29, 2023 10:15 AM [name] wrote: Reason for CRM: Patient called stating that she has still not been contacted to schedule an appointment for the biopsy of the mass in her left thigh. Patient was told to contact the office if she has not yet heard anything from the office that was suppose to schedule her for that particular appointment; Agent informed patient her referral is pending and once the referral is sent she should receive a call for scheduling.   Please contact patient when referral has been placed so patient is aware of where to schedule.   Best Phone Number: [phone]  Called the patient.     This patient has been called and verbalized understanding the the [facility] will be calling her for an appointment with in 1 -2 weeks.Karen Gibbs

## 2023-12-22 ENCOUNTER — Other Ambulatory Visit: Payer: Self-pay | Admitting: Student

## 2023-12-24 NOTE — Telephone Encounter (Signed)
 Medication sent to pharmacy

## 2024-01-01 DIAGNOSIS — D179 Benign lipomatous neoplasm, unspecified: Secondary | ICD-10-CM | POA: Diagnosis not present

## 2024-01-24 ENCOUNTER — Encounter: Payer: Self-pay | Admitting: Hematology and Oncology

## 2024-01-29 ENCOUNTER — Encounter: Payer: Self-pay | Admitting: Student

## 2024-03-09 ENCOUNTER — Encounter: Payer: Self-pay | Admitting: Student

## 2024-03-11 ENCOUNTER — Encounter: Payer: Self-pay | Admitting: Hematology and Oncology

## 2024-03-11 ENCOUNTER — Other Ambulatory Visit: Payer: Self-pay

## 2024-03-11 ENCOUNTER — Ambulatory Visit (INDEPENDENT_AMBULATORY_CARE_PROVIDER_SITE_OTHER): Payer: Self-pay | Admitting: Student

## 2024-03-11 VITALS — BP 136/83 | HR 78 | Temp 98.2°F | Ht 62.0 in | Wt 223.6 lb

## 2024-03-11 DIAGNOSIS — I10 Essential (primary) hypertension: Secondary | ICD-10-CM | POA: Diagnosis not present

## 2024-03-11 DIAGNOSIS — D179 Benign lipomatous neoplasm, unspecified: Secondary | ICD-10-CM

## 2024-03-11 DIAGNOSIS — I159 Secondary hypertension, unspecified: Secondary | ICD-10-CM

## 2024-03-11 MED ORDER — LOSARTAN POTASSIUM 50 MG PO TABS
50.0000 mg | ORAL_TABLET | Freq: Every day | ORAL | 11 refills | Status: DC
  Filled 2024-03-11: qty 30, 30d supply, fill #0
  Filled 2024-05-05: qty 30, 30d supply, fill #1
  Filled 2024-06-04: qty 30, 30d supply, fill #2
  Filled 2024-07-07: qty 30, 30d supply, fill #3
  Filled 2024-08-03 – 2024-08-10 (×2): qty 30, 30d supply, fill #4

## 2024-03-11 NOTE — Patient Instructions (Addendum)
 Thank you so much for coming to the clinic today!   Please do NOT take the Diovan -hydrochlorothiazide , we are switching you to Losartan  50mg  ONLY Please fill out the financial assistance form as soon as you can.   If you have any questions please feel free to the call the clinic at anytime at [phone]. It was a pleasure seeing you!  Best, Dr. Ifeoma Vallin

## 2024-03-11 NOTE — Progress Notes (Unsigned)
 CC: HTN follow up  HPI:  [name] is a 53 y.o. female living with a history stated below and presents today for HTN follow up. Please see problem based assessment and plan for additional details.  Past Medical History:  Diagnosis Date   Anemia    Environmental allergies    Fibroid     Current Outpatient Medications on File Prior to Visit  Medication Sig Dispense Refill   albuterol  (VENTOLIN  HFA) 108 (90 Base) MCG/ACT inhaler TAKE 2 PUFFS BY MOUTH EVERY 6 HOURS AS NEEDED FOR WHEEZE OR SHORTNESS OF BREATH 8.5 each 2   No current facility-administered medications on file prior to visit.    Family History  Problem Relation Age of Onset   Breast cancer Sister     Social History   Socioeconomic History   Marital status: Married    Spouse name: Not on file   Number of children: Not on file   Years of education: Not on file   Highest education level: Not on file  Occupational History   Not on file  Tobacco Use   Smoking status: Never   Smokeless tobacco: Never  Vaping Use   Vaping status: Never Used  Substance and Sexual Activity   Alcohol use: No   Drug use: No   Sexual activity: Yes    Birth control/protection: None  Other Topics Concern   Not on file  Social History Narrative   ** Merged History Encounter **       ** Merged History Encounter **       Social Drivers of Corporate investment banker Strain: Low Risk  (10/04/2023)   Overall Financial Resource Strain (CARDIA)    Difficulty of Paying Living Expenses: Not hard at all  Food Insecurity: No Food Insecurity (10/04/2023)   Hunger Vital Sign    Worried About Running Out of Food in the Last Year: Never true    Ran Out of Food in the Last Year: Never true  Transportation Needs: No Transportation Needs (10/04/2023)   PRAPARE - Administrator, Civil Service (Medical): No    Lack of Transportation (Non-Medical): No  Physical Activity: Not on file  Stress: No Stress Concern Present  (10/04/2023)   [employer] of Occupational Health - Occupational Stress Questionnaire    Feeling of Stress : Only a little  Social Connections: Moderately Isolated (10/04/2023)   Social Connection and Isolation Panel    Frequency of Communication with Friends and Family: More than three times a week    Frequency of Social Gatherings with Friends and Family: More than three times a week    Attends Religious Services: Never    Database administrator or Organizations: No    Attends Banker Meetings: Never    Marital Status: Married  Catering manager Violence: Not At Risk (10/04/2023)   Humiliation, Afraid, Rape, and Kick questionnaire    Fear of Current or Ex-Partner: No    Emotionally Abused: No    Physically Abused: No    Sexually Abused: No    Review of Systems: ROS negative except for what is noted on the assessment and plan.  Vitals:   03/11/24 0822  BP: 136/83  Pulse: 78  Temp: 98.2 F (36.8 C)  TempSrc: Oral  SpO2: 99%  Weight: 223 lb 9.6 oz (101.4 kg)  Height: 5' 2 (1.575 m)    Physical Exam: Constitutional: well-appearing female  in no acute distress Cardiovascular: regular rate and rhythm,  no m/r/g Pulmonary/Chest: normal work of breathing on room air, lungs clear to auscultation bilaterally Abdominal: soft, non-tender, non-distended   Assessment & Plan:   HTN (hypertension) Pt presents for discussion about her blood pressure. She is currently prescribed Valsartan -Hydrochlorothiazide  80-12.5mg , however has not been taking it as it would make her dizzy. She has been taking her left over Losartan  25mg . Last BMP in March with no electrolyte abnormalities. Her blood pressure today is 136/83, and she took her losartan  25mg  last night.   Still have some room for improvement in blood pressure, will increase her losartan  to 50mg .   Plan:  - Stop Valsartan -hydrochlorothiazide  (pt not taking)  - Start Losartan  50mg     Patient discussed with Dr.  Trudy Dirks Belladonna Lubinski, M.D. [facility], PGY-3 Pager: [phone] Date 03/12/2024 Time 8:07 AM

## 2024-03-11 NOTE — Assessment & Plan Note (Signed)
 Pt presents for discussion about her blood pressure. She is currently prescribed Valsartan -Hydrochlorothiazide  80-12.5mg , however has not been taking it as it would make her dizzy. She has been taking her left over Losartan  25mg . Last BMP in March with no electrolyte abnormalities. Her blood pressure today is 136/83, and she took her losartan  25mg  last night.   Still have some room for improvement in blood pressure, will increase her losartan  to 50mg .   Plan:  - Stop Valsartan -hydrochlorothiazide  (pt not taking)  - Start Losartan  50mg 

## 2024-03-11 NOTE — Assessment & Plan Note (Signed)
 Recently discussed with ortho oncology at the end of April, recommended six month monitoring.

## 2024-03-12 NOTE — Progress Notes (Signed)
 Internal Medicine Clinic Attending  Case discussed with the resident at the time of the visit.  We reviewed the resident's history and exam and pertinent patient test results.  I agree with the assessment, diagnosis, and plan of care documented in the resident's note.

## 2024-03-17 ENCOUNTER — Encounter: Payer: Self-pay | Admitting: Hematology and Oncology

## 2024-03-23 ENCOUNTER — Telehealth: Payer: Self-pay

## 2024-03-23 NOTE — Telephone Encounter (Signed)
 Patient not responded. Left a voice mail.

## 2024-03-24 ENCOUNTER — Inpatient Hospital Stay: Payer: BC Managed Care – PPO | Attending: Hematology and Oncology | Admitting: Hematology and Oncology

## 2024-03-24 VITALS — BP 133/84 | HR 79 | Temp 98.7°F | Resp 17 | Wt 221.9 lb

## 2024-03-24 DIAGNOSIS — N920 Excessive and frequent menstruation with regular cycle: Secondary | ICD-10-CM | POA: Diagnosis not present

## 2024-03-24 DIAGNOSIS — R519 Headache, unspecified: Secondary | ICD-10-CM | POA: Insufficient documentation

## 2024-03-24 DIAGNOSIS — D500 Iron deficiency anemia secondary to blood loss (chronic): Secondary | ICD-10-CM | POA: Insufficient documentation

## 2024-03-24 DIAGNOSIS — D1724 Benign lipomatous neoplasm of skin and subcutaneous tissue of left leg: Secondary | ICD-10-CM | POA: Insufficient documentation

## 2024-03-24 NOTE — Progress Notes (Signed)
 [facility] CONSULT NOTE  Patient Care Team: Nooruddin, Saad, MD as PCP - General (Internal Medicine)  CHIEF COMPLAINTS/PURPOSE OF CONSULTATION:  IDA  ASSESSMENT & PLAN:   This is a pleasant 53 year old female patient referred to hematology for IDA secondary to menstrual blood loss.   Assessment and Plan Assessment & Plan Heavy menstrual bleeding Persistent heavy menstrual bleeding causing fatigue and headaches, contributing to iron  deficiency anemia. - Order blood work to assess current iron  levels.  Iron  deficiency anemia Iron  deficiency anemia due to heavy menstrual bleeding. Previously improved with IV iron . Currently on daily oral iron  supplements. Blood work will guide need for IV iron  before surgery. - Continue daily oral iron  supplements. - Order blood work to assess current iron  levels. - Consider IV iron  supplementation if blood work indicates low iron  levels.  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  She was recommended resection I encouraged her to continue FU with [facility] for further recommendations.   HISTORY OF PRESENTING ILLNESS:   Karen Gibbs 53 y.o. female is here because of severe IDA.  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  Discussed the use of AI scribe software for clinical note transcription with the patient, who gave verbal consent to proceed.  History of Present Illness Karen Gibbs is a 53 year old female with heavy menstrual bleeding and iron  deficiency anemia who presents for follow-up on her anemia and leg swelling.  She continues to experience heavy menstrual cycles, which have been consistent since her last visit. Associated symptoms include swelling and headaches. She feels more tired and short of breath, which she  attributes to her anemia.  She describes significant issues with her left leg, including excessive swelling. Previously, a blood clot was suspected, but imaging did not confirm this. An MRI at [facility] revealed a soft tissue mass in the left posterior thigh, and she was told by the oncologist that it was mostly benign.  She has a history of iron  deficiency anemia, for which she received intravenous iron  therapy last year, resulting in significant improvement. She continues to take oral iron  supplements daily. She is concerned about her anemia in relation to potential surgery for the leg mass.  She also takes losartan  potassium daily and inquires about its effects, particularly regarding potassium levels. She mentions experiencing persistent abdominal pain, which she associates with her medication. Additionally, she takes a supplement called CMOS.  Rest of the pertinent 10 point ROS reviewed and negative.  MEDICAL HISTORY:  Past Medical History:  Diagnosis Date   Anemia    Environmental allergies    Fibroid     SURGICAL HISTORY: Past Surgical History:  Procedure Laterality Date   CESAREAN SECTION     C/S x 2    SOCIAL HISTORY: Social History   Socioeconomic History   Marital status: Married    Spouse name: Not on file   Number of children: Not on file   Years of education: Not on file   Highest education level: Not on file  Occupational History   Not on file  Tobacco Use   Smoking status: Never   Smokeless tobacco: Never  Vaping Use   Vaping status: Never Used  Substance and Sexual Activity   Alcohol use: No   Drug use: No   Sexual activity: Yes  Birth control/protection: None  Other Topics Concern   Not on file  Social History Narrative   ** Merged History Encounter **       ** Merged History Encounter **       Social Drivers of Corporate investment banker Strain: Low Risk  (10/04/2023)   Overall Financial Resource Strain (CARDIA)    Difficulty of  Paying Living Expenses: Not hard at all  Food Insecurity: No Food Insecurity (10/04/2023)   Hunger Vital Sign    Worried About Running Out of Food in the Last Year: Never true    Ran Out of Food in the Last Year: Never true  Transportation Needs: No Transportation Needs (10/04/2023)   PRAPARE - Administrator, Civil Service (Medical): No    Lack of Transportation (Non-Medical): No  Physical Activity: Not on file  Stress: No Stress Concern Present (10/04/2023)   [employer] of Occupational Health - Occupational Stress Questionnaire    Feeling of Stress : Only a little  Social Connections: Moderately Isolated (10/04/2023)   Social Connection and Isolation Panel    Frequency of Communication with Friends and Family: More than three times a week    Frequency of Social Gatherings with Friends and Family: More than three times a week    Attends Religious Services: Never    Database administrator or Organizations: No    Attends Banker Meetings: Never    Marital Status: Married  Catering manager Violence: Not At Risk (10/04/2023)   Humiliation, Afraid, Rape, and Kick questionnaire    Fear of Current or Ex-Partner: No    Emotionally Abused: No    Physically Abused: No    Sexually Abused: No    FAMILY HISTORY: Family History  Problem Relation Age of Onset   Breast cancer Sister     ALLERGIES:  has no known allergies.  MEDICATIONS:  Current Outpatient Medications  Medication Sig Dispense Refill   albuterol  (VENTOLIN  HFA) 108 (90 Base) MCG/ACT inhaler TAKE 2 PUFFS BY MOUTH EVERY 6 HOURS AS NEEDED FOR WHEEZE OR SHORTNESS OF BREATH 8.5 each 2   losartan  (COZAAR ) 50 MG tablet Take 1 tablet (50 mg total) by mouth daily. 30 tablet 11   No current facility-administered medications for this visit.     PHYSICAL EXAMINATION: ECOG PERFORMANCE STATUS: 0 - Asymptomatic  Vitals:   03/24/24 1151  BP: 133/84  Pulse: 79  Resp: 17  Temp: 98.7 F (37.1 C)  SpO2:  100%   Filed Weights   03/24/24 1151  Weight: 221 lb 14.4 oz (100.7 kg)    GENERAL:alert, no distress and comfortable No palpable cervical adenopathy Chest: CTA bilaterally Heart: RRR No LE edema.   LABORATORY DATA:  I have reviewed the data as listed Lab Results  Component Value Date   WBC 5.0 10/04/2023   HGB 11.6 10/04/2023   HCT 38.5 10/04/2023   MCV 77 (L) 10/04/2023   PLT 311 10/04/2023     Chemistry      Component Value Date/Time   NA 142 11/04/2023 0953   K 3.9 11/04/2023 0953   CL 103 11/04/2023 0953   CO2 24 11/04/2023 0953   BUN 10 11/04/2023 0953   CREATININE 0.60 11/04/2023 0953   CREATININE 0.50 10/08/2014 1256      Component Value Date/Time   CALCIUM 9.0 11/04/2023 0953   ALKPHOS 53 09/19/2022 1637   AST 17 09/19/2022 1637   ALT 10 09/19/2022 1637   BILITOT 0.4  09/19/2022 1637       RADIOGRAPHIC STUDIES: I have personally reviewed the radiological images as listed and agreed with the findings in the report. No results found.  All questions were answered. The patient knows to call the clinic with any problems, questions or concerns. I spent 30 minutes in the care of this patient including H and P, review of records, counseling and coordination of care.     Amber Stalls, MD 03/24/2024 12:02 PM

## 2024-03-24 NOTE — Progress Notes (Signed)
 [name]  Patient Care Team: Nooruddin, Saad, MD as PCP - General (Internal Medicine)  CHIEF COMPLAINTS/PURPOSE OF CONSULTATION:  IDA  ASSESSMENT & PLAN:   This is a pleasant 53 year old female patient referred to hematology for IDA secondary to menstrual blood loss.    Menorrhagia and resulting IDA Persistent heavy menstrual bleeding. No current use of hormonal contraceptives. -Order blood work to assess iron  levels. -If iron  levels are low, consider additional iron  supplementation.  Unilateral Lower Extremity Swelling Ultrasound previously performed showed no blood clot. 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 She saw [facility] oncology, options were observation vs resection.   HISTORY OF PRESENTING ILLNESS:   Karen Gibbs 53 y.o. female is here because of severe IDA.  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  Discussed the use of AI scribe software for clinical note transcription with the patient, who gave verbal consent to proceed.  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   MEDICAL HISTORY:  Past Medical History:  Diagnosis Date   Anemia    Environmental allergies    Fibroid     SURGICAL HISTORY: Past Surgical History:  Procedure Laterality Date   CESAREAN SECTION     C/S x 2    SOCIAL HISTORY: Social History   Socioeconomic History   Marital status: Married    Spouse name: Not on file   Number of children: Not on file   Years of education: Not on file   Highest education level: Not on file  Occupational History   Not on file  Tobacco Use   Smoking status: Never   Smokeless tobacco: Never  Vaping Use   Vaping status: Never Used  Substance and Sexual Activity   Alcohol use: No   Drug use: No    Sexual activity: Yes    Birth control/protection: None  Other Topics Concern   Not on file  Social History Narrative   ** Merged History Encounter **       ** Merged History Encounter **       Social Drivers of Corporate investment banker Strain: Low Risk  (10/04/2023)   Overall Financial Resource Strain (CARDIA)    Difficulty of Paying Living Expenses: Not hard at all  Food Insecurity: No Food Insecurity (10/04/2023)   Hunger Vital Sign    Worried About Running Out of Food in the Last Year: Never true    Ran Out of Food in the Last Year: Never true  Transportation Needs: No Transportation Needs (10/04/2023)   PRAPARE - Administrator, Civil Service (Medical): No    Lack of Transportation (Non-Medical): No  Physical Activity: Not on file  Stress: No Stress Concern Present (10/04/2023)   [employer] of Occupational Health - Occupational Stress Questionnaire    Feeling of Stress : Only a little  Social Connections: Moderately Isolated (10/04/2023)   Social Connection and Isolation Panel    Frequency of Communication with Friends and Family: More than three times a week    Frequency of Social Gatherings with Friends and Family: More than three times a week    Attends Religious Services: Never    Database administrator or Organizations: No    Attends  Club or Organization Meetings: Never    Marital Status: Married  Catering manager Violence: Not At Risk (10/04/2023)   Humiliation, Afraid, Rape, and Kick questionnaire    Fear of Current or Ex-Partner: No    Emotionally Abused: No    Physically Abused: No    Sexually Abused: No    FAMILY HISTORY: Family History  Problem Relation Age of Onset   Breast cancer Sister     ALLERGIES:  has no known allergies.  MEDICATIONS:  Current Outpatient Medications  Medication Sig Dispense Refill   albuterol  (VENTOLIN  HFA) 108 (90 Base) MCG/ACT inhaler TAKE 2 PUFFS BY MOUTH EVERY 6 HOURS AS NEEDED FOR WHEEZE OR SHORTNESS OF  BREATH 8.5 each 2   losartan  (COZAAR ) 50 MG tablet Take 1 tablet (50 mg total) by mouth daily. 30 tablet 11   No current facility-administered medications for this visit.     PHYSICAL EXAMINATION: ECOG PERFORMANCE STATUS: 0 - Asymptomatic  Vitals:   03/24/24 1151  BP: 133/84  Pulse: 79  Resp: 17  Temp: 98.7 F (37.1 C)  SpO2: 100%   Filed Weights   03/24/24 1151  Weight: 221 lb 14.4 oz (100.7 kg)    GENERAL:alert, no distress and comfortable Left leg visibly bigger than right.   LABORATORY DATA:  I have reviewed the data as listed Lab Results  Component Value Date   WBC 5.0 10/04/2023   HGB 11.6 10/04/2023   HCT 38.5 10/04/2023   MCV 77 (L) 10/04/2023   PLT 311 10/04/2023     Chemistry      Component Value Date/Time   NA 142 11/04/2023 0953   K 3.9 11/04/2023 0953   CL 103 11/04/2023 0953   CO2 24 11/04/2023 0953   BUN 10 11/04/2023 0953   CREATININE 0.60 11/04/2023 0953   CREATININE 0.50 10/08/2014 1256      Component Value Date/Time   CALCIUM 9.0 11/04/2023 0953   ALKPHOS 53 09/19/2022 1637   AST 17 09/19/2022 1637   ALT 10 09/19/2022 1637   BILITOT 0.4 09/19/2022 1637       RADIOGRAPHIC STUDIES: I have personally reviewed the radiological images as listed and agreed with the findings in the report. No results found.  All questions were answered. The patient knows to call the clinic with any problems, questions or concerns. I spent 30 minutes in the care of this patient including H and P, review of records, counseling and coordination of care.     Amber Stalls, MD 03/24/2024 12:03 PM

## 2024-03-25 ENCOUNTER — Telehealth: Payer: Self-pay | Admitting: Hematology and Oncology

## 2024-03-25 NOTE — Telephone Encounter (Signed)
 Left patient a vm regarding upcoming appointment

## 2024-03-27 ENCOUNTER — Inpatient Hospital Stay

## 2024-03-31 ENCOUNTER — Inpatient Hospital Stay

## 2024-03-31 DIAGNOSIS — D500 Iron deficiency anemia secondary to blood loss (chronic): Secondary | ICD-10-CM | POA: Diagnosis not present

## 2024-03-31 DIAGNOSIS — R519 Headache, unspecified: Secondary | ICD-10-CM | POA: Diagnosis not present

## 2024-03-31 DIAGNOSIS — D1724 Benign lipomatous neoplasm of skin and subcutaneous tissue of left leg: Secondary | ICD-10-CM | POA: Diagnosis not present

## 2024-03-31 DIAGNOSIS — N920 Excessive and frequent menstruation with regular cycle: Secondary | ICD-10-CM | POA: Diagnosis not present

## 2024-03-31 LAB — IRON AND IRON BINDING CAPACITY (CC-WL,HP ONLY)
Iron: 59 ug/dL (ref 28–170)
Saturation Ratios: 12 % (ref 10.4–31.8)
TIBC: 515 ug/dL — ABNORMAL HIGH (ref 250–450)
UIBC: 456 ug/dL — ABNORMAL HIGH (ref 148–442)

## 2024-03-31 LAB — CBC WITH DIFFERENTIAL/PLATELET
Abs Immature Granulocytes: 0.01 K/uL (ref 0.00–0.07)
Basophils Absolute: 0 K/uL (ref 0.0–0.1)
Basophils Relative: 1 %
Eosinophils Absolute: 0.2 K/uL (ref 0.0–0.5)
Eosinophils Relative: 5 %
HCT: 38.8 % (ref 36.0–46.0)
Hemoglobin: 12.4 g/dL (ref 12.0–15.0)
Immature Granulocytes: 0 %
Lymphocytes Relative: 46 %
Lymphs Abs: 2.1 K/uL (ref 0.7–4.0)
MCH: 24.5 pg — ABNORMAL LOW (ref 26.0–34.0)
MCHC: 32 g/dL (ref 30.0–36.0)
MCV: 76.7 fL — ABNORMAL LOW (ref 80.0–100.0)
Monocytes Absolute: 0.3 K/uL (ref 0.1–1.0)
Monocytes Relative: 6 %
Neutro Abs: 1.9 K/uL (ref 1.7–7.7)
Neutrophils Relative %: 42 %
Platelets: 237 K/uL (ref 150–400)
RBC: 5.06 MIL/uL (ref 3.87–5.11)
RDW: 15.9 % — ABNORMAL HIGH (ref 11.5–15.5)
WBC: 4.5 K/uL (ref 4.0–10.5)
nRBC: 0 % (ref 0.0–0.2)

## 2024-03-31 LAB — FERRITIN: Ferritin: 20 ng/mL (ref 11–307)

## 2024-04-01 ENCOUNTER — Telehealth: Payer: Self-pay | Admitting: Pharmacy Technician

## 2024-04-01 ENCOUNTER — Ambulatory Visit: Payer: Self-pay | Admitting: Hematology and Oncology

## 2024-04-01 NOTE — Telephone Encounter (Addendum)
 Auth Submission: APPROVED Site of care: Site of care: [facility] Payer: [place] [employer] MEDICA[employer] Medication & CPT/J Code(s) submitted: Venofer  (Iron  Sucrose) J1756 Diagnosis Code:  Route of submission (phone, fax, portal):  Phone # Fax # Auth type: Buy/Bill PB Units/visits requested: X3 DOSES Reference number:  Approval from: 04/01/24 to 08/02/24

## 2024-04-06 ENCOUNTER — Ambulatory Visit

## 2024-04-13 ENCOUNTER — Ambulatory Visit

## 2024-04-13 VITALS — BP 123/82 | HR 75 | Temp 98.2°F | Resp 16 | Ht 62.0 in | Wt 221.8 lb

## 2024-04-13 DIAGNOSIS — D500 Iron deficiency anemia secondary to blood loss (chronic): Secondary | ICD-10-CM

## 2024-04-13 DIAGNOSIS — N920 Excessive and frequent menstruation with regular cycle: Secondary | ICD-10-CM | POA: Diagnosis not present

## 2024-04-13 DIAGNOSIS — T454X5A Adverse effect of iron and its compounds, initial encounter: Secondary | ICD-10-CM | POA: Diagnosis not present

## 2024-04-13 MED ORDER — METHYLPREDNISOLONE SODIUM SUCC 125 MG IJ SOLR
125.0000 mg | Freq: Once | INTRAMUSCULAR | Status: AC | PRN
  Administered 2024-04-13 (×2): 125 mg via INTRAVENOUS

## 2024-04-13 MED ORDER — FAMOTIDINE IN NACL 20-0.9 MG/50ML-% IV SOLN: 20.0000 mg | Freq: Once | INTRAVENOUS | Status: DC | PRN

## 2024-04-13 MED ORDER — SODIUM CHLORIDE 0.9 % IV SOLN: Freq: Once | INTRAVENOUS | Status: AC | PRN

## 2024-04-13 MED ORDER — SODIUM CHLORIDE 0.9 % IV SOLN
300.0000 mg | Freq: Once | INTRAVENOUS | Status: AC
Start: 2024-04-13 — End: 2024-04-13
  Administered 2024-04-13 (×2): 300 mg via INTRAVENOUS
  Filled 2024-04-13: qty 15

## 2024-04-13 MED ORDER — ALBUTEROL SULFATE HFA 108 (90 BASE) MCG/ACT IN AERS
2.0000 | INHALATION_SPRAY | Freq: Once | RESPIRATORY_TRACT | Status: AC | PRN
  Administered 2024-04-13 (×2): 2 via RESPIRATORY_TRACT

## 2024-04-13 MED ORDER — EPINEPHRINE 0.3 MG/0.3ML IJ SOAJ: 0.3000 mg | Freq: Once | INTRAMUSCULAR | Status: DC | PRN

## 2024-04-13 MED ORDER — DIPHENHYDRAMINE HCL 50 MG/ML IJ SOLN
50.0000 mg | Freq: Once | INTRAMUSCULAR | Status: AC | PRN
  Administered 2024-04-13 (×2): 50 mg via INTRAVENOUS

## 2024-04-13 NOTE — Progress Notes (Signed)
 Diagnosis: Iron  Deficiency Anemia  Provider:  Praveen Mannam MD  Procedure: IV Infusion  IV Type: Peripheral, IV Location: L Antecubital  Venofer  (Iron  Sucrose), Dose: 300 mg  Infusion Start Time: 1409  Infusion Stop Time: 1417  Post Infusion IV Care: Peripheral IV Discontinued  Discharge: Condition: Good, Destination: Home . AVS Declined  Performed by:  Maximiano JONELLE Pouch, LPN    At approximately 1416, patient complained of symptoms including shortness of breath and new onset cough, as well as feeling like something is wrong. Infusion stopped at 1417. Emergency protocols initiated and emergency medications administered including Benadryl  50 mg IVP at 1421, Solumedrol 125 mg IVP at 1422, NS 1000 mL bolus at 1420, and Albuterol  inhaler (2 puffs) at 1428. Albuterol  administered after patient stated her chest felt like when she needed her inhaler at home. Vital signs stable. Ordering provider Dr. Amber Stalls, MD notified via secure message at 1422. Immediate response not received, so additional secure message sent to Dr. Praveen Mannam, MD at 1429. Per Dr. Theophilus, no additional orders and ok to continue to monitor for 30 minutes and discharge if symptoms improving/resolved. Family/signficant other present with patient. At the end of the 30 minute observation period, patient stated that symptoms had resolved. Vital signs remained stable. Patient discharged at 51.   While patient was on her monitoring period, Dr. Stalls responded at 1456 and agreed that patient ok to retry Venofer  infusion at a slower rate on a different date. Dr. Stalls stated she would like patient to complete her iron  infusion series at the [facility]. Patient notified and verbalized understanding and agreement. Subsequent appointments at [facility] cancelled.

## 2024-04-15 ENCOUNTER — Other Ambulatory Visit: Payer: Self-pay

## 2024-04-16 ENCOUNTER — Other Ambulatory Visit: Payer: Self-pay | Admitting: Hematology and Oncology

## 2024-04-16 ENCOUNTER — Other Ambulatory Visit: Payer: Self-pay | Admitting: Pharmacist

## 2024-04-20 ENCOUNTER — Ambulatory Visit

## 2024-04-24 ENCOUNTER — Ambulatory Visit: Admitting: Hematology and Oncology

## 2024-04-24 ENCOUNTER — Ambulatory Visit

## 2024-04-27 ENCOUNTER — Ambulatory Visit

## 2024-05-05 ENCOUNTER — Encounter: Payer: Self-pay | Admitting: Hematology and Oncology

## 2024-05-05 ENCOUNTER — Other Ambulatory Visit: Payer: Self-pay

## 2024-05-06 DIAGNOSIS — H5213 Myopia, bilateral: Secondary | ICD-10-CM | POA: Diagnosis not present

## 2024-05-18 ENCOUNTER — Ambulatory Visit: Admitting: Dermatology

## 2024-05-18 VITALS — BP 163/101

## 2024-05-18 DIAGNOSIS — L7211 Pilar cyst: Secondary | ICD-10-CM | POA: Diagnosis not present

## 2024-05-18 NOTE — Progress Notes (Signed)
   New Patient Visit   Subjective  Karen Gibbs is a 53 y.o. female who presents for the following: Knot of post scalp that has been there for years. It feels hard and is getting bigger.    The following portions of the chart were reviewed this encounter and updated as appropriate: medications, allergies, medical history  Review of Systems:  No other skin or systemic complaints except as noted in HPI or Assessment and Plan.  Objective  Well appearing patient in no apparent distress; mood and affect are within normal limits.   A focused examination was performed of the following areas: Scalp   Relevant exam findings are noted in the Assessment and Plan.     Assessment & Plan   Pilar Cyst Exam: Subcutaneous nodule 2.5 cm of left parietal scalp  Benign-appearing. 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 Discussed that a cyst is a benign growth that can grow over time and sometimes get irritated or inflamed. Recommend observation if it is not bothersome. Discussed option of surgical excision to remove it if it is growing, symptomatic, or other changes noted. Please call for new or changing lesions so they can be evaluated.  We will refer her to [facility]      No follow-ups on file.  I, Roseline Hutchinson, CMA, am acting as scribe for [employer], DO .   Documentation: I have reviewed the above documentation for accuracy and completeness, and I agree with the above.  Delon Lenis, DO

## 2024-05-18 NOTE — Patient Instructions (Addendum)
 Date: Mon May 18 2024  Hello Karen Gibbs,  Thank you for visiting today. Here is a summary of the key instructions:  Diagnosis: Pilar Cyst  - Procedure:   - A referral will be sent to Dr. Lowery, the surgeon, for removal of the scalp cyst   - Dr. Ace office will call you to schedule the procedure   - You can also call Dr. Ace office to potentially get scheduled faster  - Follow-up:   - Dr. Lowery will explain the procedure details before performing it   - The cyst removal will likely be done under local anesthesia while you are awake   - You may lose some hair over the area where the cyst is removed, but it should grow back   - The removed cyst will be sent to the lab for testing   - You will receive the lab report within one week through MyChart  - Other Instructions:   - Dr. Ace contact information will be included in your after-visit summary   - A picture of the marked cyst area will be added to your medical notes   - Message through MyChart for any questions  Please reach out if you have any questions or concerns.  Warm regards,  Dr. Delon Lenis Dermatology   [facility] [address] [place],  [place]  [address] Main: [phone]    Important Information  Due to recent changes in healthcare laws, you may see results of your pathology and/or laboratory studies on MyChart before the doctors have had a chance to review them. We understand that in some cases there may be results that are confusing or concerning to you. Please understand that not all results are received at the same time and often the doctors may need to interpret multiple results in order to provide you with the best plan of care or course of treatment. Therefore, we ask that you please give us  2 business days to thoroughly review all your results before contacting the office for clarification. Should we see a critical lab result, you will  be contacted sooner.   If You Need Anything After Your Visit  If you have any questions or concerns for your doctor, please call our main line at [phone] If no one answers, please leave a voicemail as directed and we will return your call as soon as possible. Messages left after 4 pm will be answered the following business day.   You may also send us  a message via MyChart. We typically respond to MyChart messages within 1-2 business days.  For prescription refills, please ask your pharmacy to contact our office. Our fax number is [phone].  If you have an urgent issue when the clinic is closed that cannot wait until the next business day, you can page your doctor at the number below.    Please note that while we do our best to be available for urgent issues outside of office hours, we are not available 24/7.   If you have an urgent issue and are unable to reach us , you may choose to seek medical care at your doctor's office, retail clinic, urgent care center, or emergency room.  If you have a medical emergency, please immediately call 911 or go to the emergency department. In the event of inclement weather, please call our main line at [phone] for an update on the status of any delays or closures.  Dermatology Medication Tips: Please keep the boxes  that topical medications come in in order to help keep track of the instructions about where and how to use these. Pharmacies typically print the medication instructions only on the boxes and not directly on the medication tubes.   If your medication is too expensive, please contact our office at [phone] or send us  a message through MyChart.   We are unable to tell what your co-pay for medications will be in advance as this is different depending on your insurance coverage. However, we may be able to find a substitute medication at lower cost or fill out paperwork to get insurance to cover a needed medication.   If a prior  authorization is required to get your medication covered by your insurance company, please allow us  1-2 business days to complete this process.  Drug prices often vary depending on where the prescription is filled and some pharmacies may offer cheaper prices.  The website www.goodrx.com contains coupons for medications through different pharmacies. The prices here do not account for what the cost may be with help from insurance (it may be cheaper with your insurance), but the website can give you the price if you did not use any insurance.  - You can print the associated coupon and take it with your prescription to the pharmacy.  - You may also stop by our office during regular business hours and pick up a GoodRx coupon card.  - If you need your prescription sent electronically to a different pharmacy, notify our office through [facility] or by phone at [phone]

## 2024-05-19 ENCOUNTER — Encounter: Payer: Self-pay | Admitting: Obstetrics and Gynecology

## 2024-05-19 ENCOUNTER — Other Ambulatory Visit: Payer: Self-pay

## 2024-05-19 ENCOUNTER — Other Ambulatory Visit (HOSPITAL_COMMUNITY)
Admission: RE | Admit: 2024-05-19 | Discharge: 2024-05-19 | Disposition: A | Discharge status: Home / Self Care | Source: Ambulatory Visit | Attending: Obstetrics and Gynecology | Admitting: Obstetrics and Gynecology

## 2024-05-19 ENCOUNTER — Ambulatory Visit (INDEPENDENT_AMBULATORY_CARE_PROVIDER_SITE_OTHER): Admitting: Obstetrics and Gynecology

## 2024-05-19 VITALS — BP 142/92 | HR 90 | Ht 62.0 in | Wt 222.7 lb

## 2024-05-19 DIAGNOSIS — I159 Secondary hypertension, unspecified: Secondary | ICD-10-CM | POA: Diagnosis not present

## 2024-05-19 DIAGNOSIS — Z1331 Encounter for screening for depression: Secondary | ICD-10-CM | POA: Diagnosis not present

## 2024-05-19 DIAGNOSIS — R928 Other abnormal and inconclusive findings on diagnostic imaging of breast: Secondary | ICD-10-CM

## 2024-05-19 DIAGNOSIS — Z01419 Encounter for gynecological examination (general) (routine) without abnormal findings: Secondary | ICD-10-CM

## 2024-05-19 DIAGNOSIS — Z124 Encounter for screening for malignant neoplasm of cervix: Secondary | ICD-10-CM | POA: Diagnosis not present

## 2024-05-19 DIAGNOSIS — D219 Benign neoplasm of connective and other soft tissue, unspecified: Secondary | ICD-10-CM

## 2024-05-19 DIAGNOSIS — Z113 Encounter for screening for infections with a predominantly sexual mode of transmission: Secondary | ICD-10-CM

## 2024-05-19 DIAGNOSIS — N939 Abnormal uterine and vaginal bleeding, unspecified: Secondary | ICD-10-CM

## 2024-05-19 NOTE — Progress Notes (Signed)
 GYNECOLOGY ANNUAL PREVENTATIVE CARE ENCOUNTER NOTE  Subjective:   Karen Gibbs is a 53 y.o. [phone] female here for a annual gynecologic exam. Current complaints: wants to discuss heavy bleeding. Has chronic anemia secondary to her heavy bleeding. Periods are monthly, bleeds 5-8 days. Used to be 2 weeks at a time, about a year ago, it went down to 1 week. Normally sees clots, however this past month, she did not see any clots. Has had anemia due to her menstrual bleeding for a long time, has discussion about hysterectomy but is worried that a surgery will cause a great deal of blood loss.     Denies abnormal discharge, pelvic pain, problems with intercourse or other gynecologic concerns. Requests STI screen.  Got depo shot 2 years ago and bled for about a months. Has also had an IUD placed 2018 or 2019 and it came with bleeding.  EMB 2020: benign    Gynecologic History Patient's last menstrual period was 05/04/2024 (exact date). Contraception: none Last Pap: 2019. Results: normal Last mammogram: 08/2023. Results: Birads 1 DEXA: has never had  Obstetric History OB History  Gravida Para Term Preterm AB Living  5 4 4  0 1 4  SAB IAB Ectopic Multiple Live Births  1 0 0  4    # Outcome Date GA Lbr Len/2nd Weight Sex Type Anes PTL Lv  5 SAB 2019          4 Term 2010     CS-Unspec     3 Term 2007     CS-Unspec     2 Term 2003     Vag-Spont     1 Term 2000     Vag-Spont       Past Medical History:  Diagnosis Date   Anemia    Environmental allergies    Fibroid     Past Surgical History:  Procedure Laterality Date   CESAREAN SECTION     C/S x 2    Current Outpatient Medications on File Prior to Visit  Medication Sig Dispense Refill   albuterol  (VENTOLIN  HFA) 108 (90 Base) MCG/ACT inhaler TAKE 2 PUFFS BY MOUTH EVERY 6 HOURS AS NEEDED FOR WHEEZE OR SHORTNESS OF BREATH 8.5 each 2   losartan  (COZAAR ) 50 MG tablet Take 1 tablet (50 mg total) by mouth daily. 30 tablet 11    No current facility-administered medications on file prior to visit.    No Known Allergies  Social History   Socioeconomic History   Marital status: Married    Spouse name: Not on file   Number of children: Not on file   Years of education: Not on file   Highest education level: Not on file  Occupational History   Not on file  Tobacco Use   Smoking status: Never   Smokeless tobacco: Never  Vaping Use   Vaping status: Never Used  Substance and Sexual Activity   Alcohol use: No   Drug use: No   Sexual activity: Yes    Birth control/protection: None  Other Topics Concern   Not on file  Social History Narrative   ** Merged History Encounter **       ** Merged History Encounter **       Social Drivers of Corporate investment banker Strain: Low Risk  (10/04/2023)   Overall Financial Resource Strain (CARDIA)    Difficulty of Paying Living Expenses: Not hard at all  Food Insecurity: Food Insecurity Present (05/19/2024)   Hunger Vital  Sign    Worried About Programme researcher, broadcasting/film/video in the Last Year: Often true    Ran Out of Food in the Last Year: Often true  Transportation Needs: No Transportation Needs (05/19/2024)   PRAPARE - Administrator, Civil Service (Medical): No    Lack of Transportation (Non-Medical): No  Physical Activity: Not on file  Stress: No Stress Concern Present (10/04/2023)   [employer] of Occupational Health - Occupational Stress Questionnaire    Feeling of Stress : Only a little  Social Connections: Moderately Isolated (10/04/2023)   Social Connection and Isolation Panel    Frequency of Communication with Friends and Family: More than three times a week    Frequency of Social Gatherings with Friends and Family: More than three times a week    Attends Religious Services: Never    Database administrator or Organizations: No    Attends Banker Meetings: Never    Marital Status: Married  Catering manager Violence: Not At Risk  (10/04/2023)   Humiliation, Afraid, Rape, and Kick questionnaire    Fear of Current or Ex-Partner: No    Emotionally Abused: No    Physically Abused: No    Sexually Abused: No    Family History  Problem Relation Age of Onset   Breast cancer Sister      The following portions of the patient's history were reviewed and updated as appropriate: allergies, current medications, past family history, past medical history, past social history, past surgical history and problem list.  Review of Systems Pertinent items are noted in HPI.   Objective:  BP (!) 142/92   Pulse 90   Ht 5' 2 (1.575 m)   Wt 222 lb 11.2 oz (101 kg)   LMP 05/04/2024 (Exact Date)   BMI 40.73 kg/m  CONSTITUTIONAL: Well-developed, well-nourished female in no acute distress.  HENT:  Normocephalic, atraumatic, External right and left ear normal. Oropharynx is clear and moist EYES: Conjunctivae and EOM are normal. Pupils are equal, round, and reactive to light. No scleral icterus.  NECK: Normal range of motion, supple, no masses.  Normal thyroid .  SKIN: Skin is warm and dry. No rash noted. Not diaphoretic. No erythema. No pallor. NEUROLOGIC: Alert and oriented to person, place, and time. Normal reflexes, muscle tone coordination. No cranial nerve deficit noted. PSYCHIATRIC: Normal mood and affect. Normal behavior. Normal judgment and thought content. CARDIOVASCULAR: Normal heart rate noted RESPIRATORY: Effort normal, no problems with respiration noted. BREASTS: Symmetric in size. No masses, skin changes, nipple drainage, or lymphadenopathy. ABDOMEN: Soft, no distention noted.  No tenderness, rebound or guarding.  PELVIC: Normal appearing external genitalia; normal appearing vaginal mucosa and cervix.  No abnormal discharge noted.  Pap smear obtained. Pelvic cultures obtained. normal uterine size, no other palpable masses, no uterine or adnexal tenderness. MUSCULOSKELETAL: Normal range of motion. No tenderness.  No  cyanosis, clubbing, or edema.   Exam done with chaperone present.  Assessment and Plan:   1. Secondary hypertension  2. Fibroids  3. Abnormal mammogram Birads 1 08/2023 Has f/u scheduled 08/2024  4. Cervical smear, as part of routine gynecological examination Pap today  5. Screening for cervical cancer - Cytology - PAP( [place])  6. Well woman exam (Primary)  7. Abnormal uterine bleeding (AUB) Long-standing heavy menstrual bleeding, has had workup and did not follow up -desires txt now but worried about a surgery causing significant anemia -needs US  and EMB and then discussion for next steps -  US  PELVIC COMPLETE WITH TRANSVAGINAL; Future  8. Routine screening for STI (sexually transmitted infection) - RPR+HBsAg+HCVAb+...   Will follow up results of pap smear/STI screen and manage accordingly. Encouraged improvement in diet and exercise.  Flu vaccine n/a DEXA not due based on age  Routine preventative health maintenance measures emphasized. Please refer to After Visit Summary for other counseling recommendations.    LOIS Yolanda Moats, MD, [facility] Attending Center for [employer] [facility])

## 2024-05-20 ENCOUNTER — Ambulatory Visit (INDEPENDENT_AMBULATORY_CARE_PROVIDER_SITE_OTHER)

## 2024-05-20 ENCOUNTER — Ambulatory Visit (HOSPITAL_COMMUNITY)
Admission: RE | Admit: 2024-05-20 | Discharge: 2024-05-20 | Disposition: A | Discharge status: Home / Self Care | Source: Ambulatory Visit | Attending: Obstetrics and Gynecology | Admitting: Obstetrics and Gynecology

## 2024-05-20 VITALS — BP 146/93 | HR 104 | Ht 62.0 in | Wt 220.0 lb

## 2024-05-20 DIAGNOSIS — R220 Localized swelling, mass and lump, head: Secondary | ICD-10-CM | POA: Diagnosis not present

## 2024-05-20 DIAGNOSIS — N939 Abnormal uterine and vaginal bleeding, unspecified: Secondary | ICD-10-CM | POA: Insufficient documentation

## 2024-05-20 DIAGNOSIS — D252 Subserosal leiomyoma of uterus: Secondary | ICD-10-CM | POA: Diagnosis not present

## 2024-05-20 LAB — RPR+HBSAG+HCVAB+...
HIV Screen 4th Generation wRfx: NONREACTIVE
Hep C Virus Ab: NONREACTIVE
Hepatitis B Surface Ag: NEGATIVE
RPR Ser Ql: NONREACTIVE

## 2024-05-20 NOTE — Progress Notes (Addendum)
 NAME: Jake Hallett  MRN: [id number]  DOB: November 17, 1970   Referring physician: Nooruddin, Saad, MD  PCP: Nooruddin, Saad, MD   CHIEF COMPLAINT: Soft tissue mass of the left posterior scalp    HPI:  Karen Gibbs is a 53 y.o. year old female who presents with a soft tissue mass that is painful, soft and has enlarged over the last few months slowly. No episodes of infection noted. Has had it since 2021.  Patient does not smoke. Off note has chronic anemia and has not followed up with hematologist after not tolerating iron  infusions.  PMH: Past Medical History:  Diagnosis Date   Anemia    Environmental allergies    Fibroid    PSH:  Past Surgical History:  Procedure Laterality Date   CESAREAN SECTION     C/S x 2     MEDICATIONS:   Current Outpatient Medications:    albuterol  (VENTOLIN  HFA) 108 (90 Base) MCG/ACT inhaler, TAKE 2 PUFFS BY MOUTH EVERY 6 HOURS AS NEEDED FOR WHEEZE OR SHORTNESS OF BREATH, Disp: 8.5 each, Rfl: 2   losartan  (COZAAR ) 50 MG tablet, Take 1 tablet (50 mg total) by mouth daily., Disp: 30 tablet, Rfl: 11   ALLERGIES:  has no known allergies.   FAMILY HISTORY:  Family History  Problem Relation Age of Onset   Breast cancer Sister       VITALS:  Vitals:   05/20/24 0922  BP: (!) 146/93  Pulse: (!) 104  SpO2: 98%    Constitutional: Good color, good hydration. VSS. Head and Neck: No lymphadenopathy, thyromegaly or masses  Chest: Normal breathing, Normal shape and excursion.  Subcutaneous mass measures 2 cm x 2 cm. Has no obvious punctum. Currently not infected.  Mobile and not attached to underlying structures.  No basin lymphadenopathy, satellite lesions or in-transit lesions.  ASSESSMENT/PLAN  Assessment & Plan   Pt. presents with a subcutaneous mass most representative of left posterior scalp cyst.  Today we discussed the risks, benefits and alternatives to mass excision and possible tissue rearrangement. We discussed  the alternatives which include continued observation; however, I told the patient that I do not believe this mass will resolve on its own. We then discussed the benefits of surgical excision which include complete removal of the lesion. We discussed the risks of excision which include seroma, hematoma, infection, bleeding, damage to surrounding healthy tissue, damage to surrounding structures and need for further surgery. We also discussed the risks of hair loss, wound separation and recurrence of the mass. We discussed scar patterns of tissue rearrangement, if needed.  I explained that the mass will be sent to pathology and if it were to be malignant further surgery may be needed. We discussed the risks of anesthesia. The patient has a good understanding of all the risks and benefits, postoperative course and care. We obtained pictures. All questions were answered.    Recommend excision in the treatment room. Risks, benefits and limitations were discussed. Procedure will be precertified and scheduled. Also although I do not anticipate major bleeding I recommended follow up with hematology for chronic anemia.   Kateena Degroote M. Mollye Guinta, MD [facility]

## 2024-05-21 ENCOUNTER — Ambulatory Visit: Payer: Self-pay | Admitting: Obstetrics and Gynecology

## 2024-05-22 LAB — CYTOLOGY - PAP
Adequacy: ABSENT
Chlamydia: NEGATIVE
Comment: NEGATIVE
Comment: NEGATIVE
Comment: NEGATIVE
Comment: NEGATIVE
Comment: NORMAL
Diagnosis: NEGATIVE
HSV1: NEGATIVE
HSV2: NEGATIVE
High risk HPV: NEGATIVE
Neisseria Gonorrhea: NEGATIVE
Trichomonas: NEGATIVE

## 2024-05-27 ENCOUNTER — Ambulatory Visit

## 2024-05-27 ENCOUNTER — Other Ambulatory Visit (HOSPITAL_COMMUNITY)
Admission: RE | Admit: 2024-05-27 | Discharge: 2024-05-27 | Disposition: A | Discharge status: Home / Self Care | Source: Ambulatory Visit

## 2024-05-27 ENCOUNTER — Ambulatory Visit (INDEPENDENT_AMBULATORY_CARE_PROVIDER_SITE_OTHER)

## 2024-05-27 DIAGNOSIS — R220 Localized swelling, mass and lump, head: Secondary | ICD-10-CM | POA: Diagnosis present

## 2024-05-27 DIAGNOSIS — L7211 Pilar cyst: Secondary | ICD-10-CM | POA: Diagnosis not present

## 2024-05-27 MED ORDER — CEPHALEXIN 500 MG PO CAPS: 500.0000 mg | ORAL_CAPSULE | Freq: Four times a day (QID) | ORAL | 0 refills | Status: AC

## 2024-05-27 MED ORDER — BACITRACIN 500 UNIT/GM EX OINT: 1.0000 | TOPICAL_OINTMENT | Freq: Two times a day (BID) | CUTANEOUS | 0 refills | Status: AC

## 2024-05-27 MED ORDER — ACETAMINOPHEN 500 MG PO TABS: 500.0000 mg | ORAL_TABLET | Freq: Four times a day (QID) | ORAL | 0 refills | Status: AC | PRN

## 2024-05-27 MED ORDER — IBUPROFEN 600 MG PO TABS: 600.0000 mg | ORAL_TABLET | Freq: Three times a day (TID) | ORAL | 0 refills | Status: DC | PRN

## 2024-05-27 NOTE — Addendum Note (Signed)
 Addended by: EUSTACIO POUR on: 05/27/2024 10:56 AM   Modules accepted: Orders

## 2024-05-27 NOTE — Progress Notes (Signed)
 PROCEDURE NOTE PLASTIC SURGERY PROCEDURE NOTE   Procedure: Excision of scalp mass   Diagnosis: Mass of posterior scalp   Surgeon: Nieves HERO. Josseline Reddin, MD   Anesthesia: 1% lidocaine with epinephrine  1:100,000 Mixed with 0.25% Marcaine 50/50 dilution (total  5 mL)   Complications: None   Specimen: Cyst, sent to phatology    DESCRIPTION OF THE PROCEDURE IN DETAIL:   We discussed the risks benefits and alternatives to lesion excision. We discussed the alternatives which would be observation of the lesion. We discussed of the risks of the lesion then becoming larger or becoming infected, complicating any future excision. We discussed the benefits of excision which include removal of the lesion and the ability to examine it under a microscope to rule out the presence of a malignancy. We also discussed the benefits of removing the lesion from a symptomatic perspective. We discussed the risks of surgery, including but not limited to infection, bleeding, damage to the surrounding healthy tissues and the need for further surgery. We also discussed the risks of wound separation, poor scarring and lesion recurrence. We also discussed the risks of anesthesia. The patient again voiced their understanding of the above and once again confirmed informed consent.   The patient was identified by name, date of birth and medical record number. The patient's forehead was prepped and draped in the standard sterile fashion using betadine solution. Timeout was conducted and preoperative instrument and needle counts were correct.    Local anesthesia was injected. The incision was then made on top of the scalp mass. The mass was circumferentially dissected, and it should be noted that it had cystic appearance. The size of the mass was 1.5 cm x 1 cm. The mass was removed and sent as a pathological specimen. The residual 2 centimeter wound was closed in layers using 3-0 Vycril and 4-0 Chromics. The incision was dressed  with bacitracin .    The patient tolerated the procedure well. There were no immediate complications. Postoperative instrument and needle counts were correct.    Postoperative care discussed, return to clinic in 10-14 days.   Para Cossey M. Linette Gunderson, MD [facility]

## 2024-05-29 LAB — SURGICAL PATHOLOGY

## 2024-05-31 ENCOUNTER — Encounter: Payer: Self-pay | Admitting: Dermatology

## 2024-06-04 ENCOUNTER — Other Ambulatory Visit: Payer: Self-pay

## 2024-06-08 ENCOUNTER — Telehealth: Payer: Self-pay | Admitting: *Deleted

## 2024-06-08 NOTE — Telephone Encounter (Signed)
 Copied from CRM #[id number]. Topic: Referral - Question >> Jun 08, 2024 10:19 AM Karen Gibbs wrote: Reason for CRM: Patient is wanting to find another surgeon for her leg in [place] because going to [facility] is hard for her. Could you assist? Patients callback number is [phone]. She has an appointment with [facility] 11/05 and she is also not sure if they accept her insurance which is medicaid.

## 2024-06-09 ENCOUNTER — Ambulatory Visit (INDEPENDENT_AMBULATORY_CARE_PROVIDER_SITE_OTHER)

## 2024-06-09 VITALS — BP 151/94 | HR 102

## 2024-06-09 DIAGNOSIS — Z9889 Other specified postprocedural states: Secondary | ICD-10-CM

## 2024-06-09 DIAGNOSIS — Z09 Encounter for follow-up examination after completed treatment for conditions other than malignant neoplasm: Secondary | ICD-10-CM

## 2024-06-09 NOTE — Progress Notes (Signed)
 Patient is status post excision of posterior scalp mass on 9/24. Incision is clean, dry, and intact with no signs of infection. Patient reports no pain. Pathology showed a benign mass.  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 She has been under the care of [facility] specialists but wishes to continue treatment locally in [place].  I explained that I am not an orthopedic oncologist but will consult with orthopedics and explore the availability of surgical oncologists for her resection in the area. I am willing to perform the reconstructive surgery following tumor removal.  Patient was instructed to keep her scheduled appointment at [facility] in November and expressed understanding.  Plan: Follow-up with me in 1 month.  Coriann Brouhard M. Keymoni Mccaster, MD [facility]

## 2024-06-25 DIAGNOSIS — L720 Epidermal cyst: Secondary | ICD-10-CM | POA: Diagnosis not present

## 2024-06-26 ENCOUNTER — Telehealth: Payer: Self-pay | Admitting: *Deleted

## 2024-06-26 ENCOUNTER — Encounter: Payer: Self-pay | Admitting: Student

## 2024-06-26 DIAGNOSIS — Z1231 Encounter for screening mammogram for malignant neoplasm of breast: Secondary | ICD-10-CM

## 2024-06-26 NOTE — Telephone Encounter (Signed)
 Copied from CRM [id number]. Topic: Clinical - Request for Lab/Test Order >> Jun 26, 2024 11:37 AM Mercer PEDLAR wrote: Reason for CRM: Patient is requesting an order for her yearly mammogram. She is unsure if she needs to be seen by PCP first. She also stated that she was given a referral for orthopedic surgery which was sent to [facility]. Patient would like to discuss going somewhere in [place] with [facility].

## 2024-06-29 ENCOUNTER — Other Ambulatory Visit: Payer: Self-pay | Admitting: Student

## 2024-06-29 DIAGNOSIS — Z1231 Encounter for screening mammogram for malignant neoplasm of breast: Secondary | ICD-10-CM

## 2024-06-29 NOTE — Telephone Encounter (Signed)
 Patient returned missed call. Let her know that order was put in for Mammogram. Received clarification about referral. She was previous referred for orthopedic surgery on her leg. Referral was sent to [facility]. Patient no able to go to do. Trying to get referral changed to [facility]. States she previously messages someone about this on myChart. This is not the plastic surgery. Thank You

## 2024-06-29 NOTE — Telephone Encounter (Signed)
 Called pt - no answer; left message on vm of mammogram order has been placed and to call to clarify which referral Ortho or plastic surg to wants changed to [place] area.

## 2024-07-01 ENCOUNTER — Other Ambulatory Visit: Payer: Self-pay | Admitting: Student

## 2024-07-01 DIAGNOSIS — D179 Benign lipomatous neoplasm, unspecified: Secondary | ICD-10-CM

## 2024-07-02 ENCOUNTER — Ambulatory Visit (INDEPENDENT_AMBULATORY_CARE_PROVIDER_SITE_OTHER): Admitting: Obstetrics and Gynecology

## 2024-07-02 ENCOUNTER — Encounter: Payer: Self-pay | Admitting: Obstetrics and Gynecology

## 2024-07-02 ENCOUNTER — Other Ambulatory Visit (HOSPITAL_COMMUNITY)
Admission: RE | Admit: 2024-07-02 | Discharge: 2024-07-02 | Disposition: A | Discharge status: Home / Self Care | Source: Ambulatory Visit | Attending: Obstetrics and Gynecology | Admitting: Obstetrics and Gynecology

## 2024-07-02 ENCOUNTER — Other Ambulatory Visit: Payer: Self-pay

## 2024-07-02 ENCOUNTER — Ambulatory Visit

## 2024-07-02 VITALS — BP 159/98 | HR 89 | Wt 221.0 lb

## 2024-07-02 VITALS — BP 159/108 | HR 93 | Temp 98.8°F | Ht 62.0 in | Wt 222.4 lb

## 2024-07-02 DIAGNOSIS — D219 Benign neoplasm of connective and other soft tissue, unspecified: Secondary | ICD-10-CM | POA: Insufficient documentation

## 2024-07-02 DIAGNOSIS — N939 Abnormal uterine and vaginal bleeding, unspecified: Secondary | ICD-10-CM | POA: Insufficient documentation

## 2024-07-02 DIAGNOSIS — Z3202 Encounter for pregnancy test, result negative: Secondary | ICD-10-CM | POA: Diagnosis not present

## 2024-07-02 DIAGNOSIS — C499 Malignant neoplasm of connective and soft tissue, unspecified: Secondary | ICD-10-CM

## 2024-07-02 DIAGNOSIS — D259 Leiomyoma of uterus, unspecified: Secondary | ICD-10-CM | POA: Diagnosis not present

## 2024-07-02 LAB — POCT PREGNANCY, URINE: Preg Test, Ur: NEGATIVE

## 2024-07-02 MED ORDER — DOXYCYCLINE HYCLATE 100 MG PO TABS: 100.0000 mg | ORAL_TABLET | Freq: Two times a day (BID) | ORAL | 0 refills | Status: AC

## 2024-07-02 MED ORDER — NORETHINDRONE ACETATE 5 MG PO TABS: 5.0000 mg | ORAL_TABLET | Freq: Every day | ORAL | 2 refills | Status: AC

## 2024-07-02 NOTE — Progress Notes (Signed)
 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 Previously did depo and had more bleeding. Tried a different medication in 2019 that she takes just during menses. Helped with one menses but didn't help on follow up. Had mirena  before and it expelled due to bleeding. Heavy bleeding for 10 years. Worried about surgery/hyst due to upcoming surgery to remove mass in leg.     The following portions of the patient's history were reviewed and updated as appropriate: allergies, current medications, past family history, past medical history, past social history, past surgical history and problem list.   Health Maintenance:   Last pap     Component Value Date/Time   DIAGPAP  05/19/2024 1036    - Negative for intraepithelial lesion or malignancy (NILM)   DIAGPAP  07/15/2018 0000    NEGATIVE FOR INTRAEPITHELIAL LESIONS OR MALIGNANCY.   HPVHIGH Negative 05/19/2024 1036   ADEQPAP  05/19/2024 1036    Satisfactory for evaluation; transformation zone component ABSENT.   ADEQPAP  07/15/2018 0000    Satisfactory for evaluation  endocervical/transformation zone component PRESENT.    Health Maintenance  Topic Date Due   COVID-19 Vaccine (1) Never done   Pneumococcal Vaccine for age over 44 (1 of 2 - PCV) Never done   Hepatitis B Vaccine (1 of 3 - 19+ 3-dose series) Never done   Zoster (Shingles) Vaccine (1 of 2) Never done   Cologuard (Stool DNA test)  Never done   Flu Shot  04/03/2024   Breast Cancer Screening  07/17/2024   Pap with HPV screening  05/19/2029   Hepatitis C Screening  Completed   HIV Screening  Completed   HPV Vaccine  Aged Out   Meningitis B Vaccine  Aged Out   DTaP/Tdap/Td vaccine  Discontinued      Review of Systems:  Pertinent items are noted in HPI. Comprehensive review of systems was otherwise negative.    Objective:  Physical Exam BP (!) 148/87   Pulse 78   Wt 221 lb (100.2 kg)   LMP 06/09/2024 (Exact Date)   BMI 40.42 kg/m    Physical Exam Vitals and nursing note reviewed. Exam conducted with a chaperone present.  Constitutional:      Appearance: Normal appearance.  HENT:     Head: Normocephalic and atraumatic.  Pulmonary:     Effort: Pulmonary effort is normal.     Breath sounds: Normal breath sounds.  Genitourinary:    General: Normal vulva.     Exam position: Lithotomy position.     Vagina: Normal.     Cervix: Normal.  Skin:    General: Skin is warm and dry.  Neurological:     General: No focal deficit present.     Mental Status: She is alert.  Psychiatric:        Mood and Affect: Mood normal.        Behavior: Behavior normal.        Thought Content: Thought content normal.        Judgment: Judgment normal.      Labs and Imaging UTERUS: Uterus measures 9.9 x 5.7 x 7.0 cm (205 ml). Multiple uterine fibroids, including a dominant 3.4 x 2.8 x 3.5 cm subserosal fibroid in the right posterior uterine body.   ENDOMETRIAL STRIPE: Endometrial measures 5 mm. Endometrial stripe is within normal limits.  RIGHT OVARY: Right ovary is not discretely visualized.   LEFT OVARY: Left ovary measures 4.1 x 1.4 x 2.9 cm (8.7 ml). Left ovary is within normal limits. There is normal arterial and venous Doppler flow.   FREE FLUID: No free fluid.   IMPRESSION: 1. Multiple uterine fibroids, as above. 2. Otherwise negative. 3. Right ovary not discretely visualized.   Electronically signed by: Pinkie Pebbles MD 05/26/2024 12:18 AM EDT RP  Endometrial Biopsy Procedure  Patient identified, informed consent performed,  indication reviewed, consent signed.  Reviewed risk of perforation, pain, bleeding, insufficient sample, etc were reviewd. Time out was performed.  Urine pregnancy test negative.  Speculum placed in the vagina.  Cervix visualized.  Cleaned with Betadine x 2.   Anterior cervix grasped anteriorly with a single tooth tenaculum.  Paracervical block was not administered.  Endometrial pipelle was used to draw up 1cc of 1% lidocaine, introduced into the cervical os and instilled into the endometrial cavity.  The pipelle was passed twice without difficulty and sample obtained. Tenaculum was removed, good hemostasis noted.  Patient tolerated procedure well.  Patient was given post-procedure instructions.      Assessment & Plan:  1. Fibroids (Primary) 2. Abnormal uterine bleeding (AUB) Now s/p uncomplicated EMB for evaluation of abnormal uterine bleeding. Aygestin  prescribed to help lighten/treat bleeding for now. Can discuss surgical management if desired, understanding surgery would need to take place after leg has recovered.  - Pregnancy, urine POC - norethindrone  (AYGESTIN ) 5 MG tablet; Take 1 tablet (5 mg total) by mouth daily.  Dispense: 30 tablet; Refill: 2 - Surgical pathology    Carter Quarry, MD Minimally Invasive Gynecologic Surgery Center for [facility], [facility]

## 2024-07-02 NOTE — Progress Notes (Addendum)
 Patient is status post excision of posterior scalp mass on 9/24. Incision is clean, dry, and intact with mild edema and erythema. Patient reports no pain but has had itchiness and has been scratching her head which may have contributed to it.  I will prescribe a short course of oral antibiotics.  Discussed with the patient who agrees with the plan.  Patient was also found to have a 1000-gram mass on her left thigh, differential diagnosis includes lipoma vs. liposarcoma. She has been under the care of [facility] specialists but wishes to continue treatment locally in [place].  After discussing discussing the case with Dr. Celena from orthopedics, he recommended Dr. Tanda from [facility].  Referral has been made since this is a closer location for the patient. Patient was instructed to keep her scheduled appointment at [facility] in November in case the operation can be done at [facility] and expressed understanding.  Interpreter was present at all times. MA as chaperone.  Plan: Follow-up with me in 3 month.  Karen Gibbs M. Nkenge Sonntag, MD [facility]

## 2024-07-06 ENCOUNTER — Ambulatory Visit: Payer: Self-pay | Admitting: Obstetrics and Gynecology

## 2024-07-06 LAB — SURGICAL PATHOLOGY

## 2024-07-07 ENCOUNTER — Other Ambulatory Visit: Payer: Self-pay

## 2024-07-08 DIAGNOSIS — R2242 Localized swelling, mass and lump, left lower limb: Secondary | ICD-10-CM | POA: Diagnosis not present

## 2024-07-08 DIAGNOSIS — D179 Benign lipomatous neoplasm, unspecified: Secondary | ICD-10-CM | POA: Diagnosis not present

## 2024-07-13 DIAGNOSIS — D1722 Benign lipomatous neoplasm of skin and subcutaneous tissue of left arm: Secondary | ICD-10-CM | POA: Diagnosis not present

## 2024-07-13 DIAGNOSIS — D1724 Benign lipomatous neoplasm of skin and subcutaneous tissue of left leg: Secondary | ICD-10-CM | POA: Diagnosis not present

## 2024-07-14 DIAGNOSIS — R2242 Localized swelling, mass and lump, left lower limb: Secondary | ICD-10-CM | POA: Diagnosis not present

## 2024-07-14 NOTE — Progress Notes (Signed)
 Orthopaedics Progress Note  Progress Note    Karen Gibbs is a 53 y.o. year old female 1 Day Post-Op s/p Procedure(s): EXCISION, TUMOR, SOFT TISSUE OF THIGH OR KNEE AREA, SUBCUTANEOUS; 3 CM OR GREATER, left thigh   Vital signs: Vitals:   07/13/24 1610 07/13/24 1921 07/13/24 2350 07/14/24 0351  BP: (!) 146/76 (!) 150/72 (!) 141/77 138/81  BP Location: Left upper arm Right upper arm Left upper arm Right upper arm  Patient Position: Lying Lying Lying Lying  BP Cuff Size: Adult     Pulse: 99  99   Resp: 18 19 20 17   Temp: 37.2 C (99 F) 37.3 C (99.2 F) 37.3 C (99.1 F) 37.2 C (98.9 F)  TempSrc: Oral Oral Oral Oral  SpO2: 98% 99% 96% 100%  Weight:      Height:       Temp (24hrs), Avg:36.9 C (98.5 F), Min:36.4 C (97.6 F), Max:37.3 C (99.2 F)   Current Diet: Diet regular   Labs: Recent Labs  Lab 07/08/24 1516  WBC 6.6  HGB 10.8*  HCT 35.8  PLT 316   Recent Labs  Lab 07/08/24 1516  APTT 25.9*  INR 1.1   Recent Labs  Lab 07/08/24 1516  NA 137  K 3.6  CL 102  CO2 24  BUN 8  CREATININE 0.6  GLUCOSE 75  CALCIUM 9.0    Physical Exam: NAD, AAOx3 Non-labored respirations, no use of accessory muscles, lying comfortably in bed.   right lower extremity Dressing/Cast: Left intact, Motor: EHL/FHL/DF/PF intact Sensory: S/S/SP/DP/T Intact Vascular: Palpable distal pulse and Cap refill <2 sec  Assessment Karen Gibbs is 53 y.o. female who is 1 Day Post-Op status post Procedure(s): EXCISION, TUMOR, SOFT TISSUE OF THIGH OR KNEE AREA, SUBCUTANEOUS; 3 CM OR GREATER, left thigh in improving clinical condition post-operatively. She is AFVSS and NVI to LLE. Complains of itchiness overnight, not tolerating ACE. Quarter-sized strikethrough noted on aquacel today. Like will be able to go home today.  Plan  DIET: advance as tolerated PAIN:  oral medications with IV breakthrough DVT PPx: ASA 81 qd PT/OT: WBAT RLE ANTIBIOTICS: Peri-op ancef protocol  DISPO:  home, pending PT clearance and pain control  Please do not hesitate to page [phone] with any questions.    MARK PHILLIP KARAVAN, MD Department of Orthopedic Surgery

## 2024-07-14 NOTE — Telephone Encounter (Signed)
 Oncology patient states that rx is not at pharmacy after having surgery today.  Triage RN gives [facility] operator phone number to patient and instructs to ask for Oncology Surgeon on call.  Patient verbalized understanding  Reason for Disposition . Health Information question, no triage required and triager able to answer question  Additional Information . Negative: Drug overdose and nurse unable to answer question . Negative: Caller requesting information not related to medicine . Negative: Caller requesting a prescription for Strep throat and has a positive culture result . Negative: Rash while taking a medication or within 3 days of stopping it . Negative: Immunization reaction suspected . Negative: [1] Asthma and [2] having symptoms of asthma (cough, wheezing, etc) . Negative: MORE THAN A DOUBLE DOSE of a prescription or over-the-counter (OTC) drug . Negative: [1] DOUBLE DOSE (an extra dose or lesser amount) of over-the-counter (OTC) drug AND [2] any symptoms (e.g., dizziness, nausea, pain, sleepiness) . Negative: [1] DOUBLE DOSE (an extra dose or lesser amount) of prescription drug AND [2] any symptoms (e.g., dizziness, nausea, pain, sleepiness) . Negative: Took another person's prescription drug . Negative: [1] DOUBLE DOSE (an extra dose or lesser amount) of prescription drug AND [2] NO symptoms (Exception: a double dose of antibiotics) . Negative: Diabetes drug error or overdose (e.g., insulin or extra dose) . Negative: [1] Request for URGENT new prescription or refill of essential medication (i.e., likelihood of harm to patient if not taken) AND [2] triager unable to fill per unit policy . Negative: [1] Prescription not at pharmacy AND [2] was prescribed today by PCP . Negative: Pharmacy calling with prescription questions and triager unable to answer question . Negative: Caller has URGENT medication question about med that PCP prescribed and triager unable to answer question .  Negative: Caller has NON-URGENT medication question about med that PCP prescribed and triager unable to answer question . Negative: Caller requesting a NON-URGENT new prescription or refill and triager unable to refill per unit policy . Negative: Caller has medication question about med not prescribed by PCP and triager unable to answer question (e.g., compatibility with other med, storage) . Negative: [1] DOUBLE DOSE (an extra dose or lesser amount) of over-the-counter (OTC) drug AND [2] NO symptoms (all triage questions negative) . Negative: [1] DOUBLE DOSE (an extra dose or lesser amount) of antibiotic drug AND [2] NO symptoms (all triage questions negative) . Negative: Caller has medication question only, adult not sick, and triager answers question . Negative: [1] Caller is not with the adult (patient) AND [2] reporting urgent symptoms . Negative: Lab result questions . Negative: Medication questions . Negative: Caller cannot be reached by phone . Negative: Caller has already spoken to PCP or another triager . Negative: RN needs further essential information from caller in order to complete triage . Negative: Requesting regular office appointment . Negative: [1] Caller requesting NON-URGENT health information AND [2] PCP's office is the best resource . Negative: General information question, no triage required and triager able to answer question . Negative: Question about upcoming scheduled test, no triage required and triager able to answer question . Negative: [1] Caller is not with the adult (patient) AND [2] probable NON-URGENT symptoms  Answer Assessment - Initial Assessment Questions 1. SYMPTOMS: Do you have any symptoms? na 2. SEVERITY: If symptoms are present, ask Are they mild, moderate or severe? na  Answer Assessment - Initial Assessment Questions 1. REASON FOR CALL or QUESTION: What is your reason for calling today? or How can I best  help you? or What question do  you have that I can help answer? see note  Protocols used: Medication Question Call-A-AH, Information Only Call-A-AH

## 2024-07-16 ENCOUNTER — Ambulatory Visit: Payer: Self-pay | Admitting: Student

## 2024-07-16 ENCOUNTER — Other Ambulatory Visit: Payer: Self-pay

## 2024-07-16 ENCOUNTER — Emergency Department (HOSPITAL_COMMUNITY)
Admission: EM | Admit: 2024-07-16 | Discharge: 2024-07-17 | Disposition: A | Discharge status: Home / Self Care | Source: Ambulatory Visit | Attending: Emergency Medicine | Admitting: Emergency Medicine

## 2024-07-16 ENCOUNTER — Ambulatory Visit: Payer: Self-pay

## 2024-07-16 ENCOUNTER — Encounter (HOSPITAL_COMMUNITY): Payer: Self-pay

## 2024-07-16 DIAGNOSIS — G8918 Other acute postprocedural pain: Secondary | ICD-10-CM | POA: Insufficient documentation

## 2024-07-16 DIAGNOSIS — Z5189 Encounter for other specified aftercare: Secondary | ICD-10-CM

## 2024-07-16 DIAGNOSIS — Z4801 Encounter for change or removal of surgical wound dressing: Secondary | ICD-10-CM | POA: Diagnosis not present

## 2024-07-16 LAB — CBC WITH DIFFERENTIAL/PLATELET
Abs Immature Granulocytes: 0.02 K/uL (ref 0.00–0.07)
Basophils Absolute: 0 K/uL (ref 0.0–0.1)
Basophils Relative: 1 %
Eosinophils Absolute: 0.3 K/uL (ref 0.0–0.5)
Eosinophils Relative: 4 %
HCT: 35 % — ABNORMAL LOW (ref 36.0–46.0)
Hemoglobin: 10.6 g/dL — ABNORMAL LOW (ref 12.0–15.0)
Immature Granulocytes: 0 %
Lymphocytes Relative: 37 %
Lymphs Abs: 3 K/uL (ref 0.7–4.0)
MCH: 22.2 pg — ABNORMAL LOW (ref 26.0–34.0)
MCHC: 30.3 g/dL (ref 30.0–36.0)
MCV: 73.4 fL — ABNORMAL LOW (ref 80.0–100.0)
Monocytes Absolute: 0.8 K/uL (ref 0.1–1.0)
Monocytes Relative: 9 %
Neutro Abs: 4.1 K/uL (ref 1.7–7.7)
Neutrophils Relative %: 49 %
Platelets: 321 K/uL (ref 150–400)
RBC: 4.77 MIL/uL (ref 3.87–5.11)
RDW: 16.8 % — ABNORMAL HIGH (ref 11.5–15.5)
WBC: 8.2 K/uL (ref 4.0–10.5)
nRBC: 0 % (ref 0.0–0.2)

## 2024-07-16 LAB — BASIC METABOLIC PANEL WITH GFR
Anion gap: 11 (ref 5–15)
BUN: 9 mg/dL (ref 6–20)
CO2: 22 mmol/L (ref 22–32)
Calcium: 8.4 mg/dL — ABNORMAL LOW (ref 8.9–10.3)
Chloride: 106 mmol/L (ref 98–111)
Creatinine, Ser: 0.58 mg/dL (ref 0.44–1.00)
GFR, Estimated: >60 mL/min (ref >60)
Glucose, Bld: 85 mg/dL (ref 70–99)
Potassium: 4.3 mmol/L (ref 3.5–5.1)
Sodium: 139 mmol/L (ref 135–145)

## 2024-07-16 MED ORDER — OXYCODONE-ACETAMINOPHEN 5-325 MG PO TABS
2.0000 | ORAL_TABLET | Freq: Once | ORAL | Status: AC
  Administered 2024-07-16: 2 via ORAL
  Filled 2024-07-16: qty 2

## 2024-07-16 MED ORDER — OXYCODONE HCL 5 MG PO TABS
10.0000 mg | ORAL_TABLET | Freq: Once | ORAL | Status: AC
  Administered 2024-07-16: 10 mg via ORAL
  Filled 2024-07-16: qty 2

## 2024-07-16 MED ORDER — ACETAMINOPHEN 500 MG PO TABS: 500.0000 mg | ORAL_TABLET | Freq: Four times a day (QID) | ORAL | 0 refills | Status: AC | PRN

## 2024-07-16 MED ORDER — ONDANSETRON 4 MG PO TBDP
4.0000 mg | ORAL_TABLET | Freq: Once | ORAL | Status: AC
  Administered 2024-07-16: 4 mg via ORAL
  Filled 2024-07-16: qty 1

## 2024-07-16 MED ORDER — IBUPROFEN 600 MG PO TABS
600.0000 mg | ORAL_TABLET | Freq: Three times a day (TID) | ORAL | 0 refills | Status: AC | PRN
Start: 2024-07-16 — End: ?

## 2024-07-16 MED ORDER — GABAPENTIN 300 MG PO CAPS: 300.0000 mg | ORAL_CAPSULE | Freq: Two times a day (BID) | ORAL | 0 refills | Status: AC

## 2024-07-16 NOTE — Telephone Encounter (Signed)
 Per chart, pt is currently at the ER.

## 2024-07-16 NOTE — ED Notes (Signed)
 Pt spouse states they are leaving and will see medical provider tomorrow

## 2024-07-16 NOTE — ED Triage Notes (Signed)
 Patient reports she had surgery on Monday 11/10 to remove a mass from her hamstring. Today she is reporting black/ old blood drainage. She reports that it is swelling and the pain in the surgical site has increased.

## 2024-07-16 NOTE — Anesthesia Postprocedure Evaluation (Signed)
 Discharge from Anesthesia Care  Patient: Karen Gibbs  Procedures performed: EXCISION, TUMOR, SOFT TISSUE OF THIGH OR KNEE AREA, SUBCUTANEOUS; 3 CM OR GREATER, left thigh (Left: Thigh)  Anesthesia type: General, general Vitals Value Taken Time  BP 128/83 07/13/24 12:15  Temp 36.7 C (98 F) 07/13/24 11:43  Pulse 70 07/13/24 12:17  Resp 14 07/13/24 12:17  SpO2 99 % 07/13/24 12:17  Vitals shown include unfiled device data. *If vital value is absent from grid, please refer to corresponding flowsheet vitals data  Anesthesia Post Evaluation  Recent Flowsheet Information: Vitals Value Taken Time  Respiratory (WDL)    Respiratory Pattern    Musculoskeletal (WDL) WDL 07/13/24 10:05  Neuro (WDL) WDL 07/13/24 10:05  Level of Consciousness    Orientation Level    Pain Assessment %% 0-10 07/13/24 11:43  Pain Score Five 07/13/24 11:43  Wong-Baker FACES Pain Rating    Nausea Status Present 07/13/24 11:15  Vomiting Status Controlled 07/13/24 11:15  Gastrointestinal (WDL) WDL 07/13/24 10:05  GI Symptoms

## 2024-07-16 NOTE — Telephone Encounter (Signed)
 Pt has an appt today @ 1415PM with Dr Nooruddin.

## 2024-07-16 NOTE — ED Provider Triage Note (Signed)
 Emergency Medicine Provider Triage Evaluation Note  Karen Gibbs , a 53 y.o. female  was evaluated in triage.  Pt complains of presents for evaluation of surgical incision she has a history of iron  deficiency anemia had a mass of her leg removed from the posterior left leg on 1110 at [facility].  She took ibuprofen  this morning and had some oxycodone  prescribed but has not taken it yet because her daughter just picked it up from the store.  She rates her pain as severe.  She feels like something might have popped one of the stitches and she has noticed that there is a lot of fluid leaking behind her surgical bandage.  They did not have a drain placed because she has a history of significant anemia and her doctors wanted to prevent further blood loss.  She denies fever or chills..  Review of Systems  Positive: Leg pain Negative: Fever  Physical Exam  BP (!) 135/101 (BP Location: Right Arm)   Pulse 85   Temp 98.1 F (36.7 C)   Resp 18   Ht 5' 2 (1.575 m)   Wt 99.8 kg   LMP 06/09/2024 (Exact Date)   SpO2 98%   BMI 40.24 kg/m  Gen:   Awake, no distress   Resp:  Normal effort  MSK:   Moves extremities without difficulty  Other:  Left surgical incision examined with bandage in place.  There is dark blood through the incision bandage.  No significant heat or redness.  Medical Decision Making  Medically screening exam initiated at 3:33 PM.  Appropriate orders placed.  Royann Morozov was informed that the remainder of the evaluation will be completed by another provider, this initial triage assessment does not replace that evaluation, and the importance of remaining in the ED until their evaluation is complete.     Arloa Chroman, PA-C 07/16/24 1535

## 2024-07-16 NOTE — Telephone Encounter (Signed)
 Called pt - pt stated her wound is draining blood and swollen.Pt  informed to call the Doctor at [facility], who did her surgery, or to the ED per PCP. Pt stated she called [facility] several times this week and also sent a My Chart message - she has not heard back. Stated her f/u appt with them is 11/26. Informed pt she can go to either one of the ER's here in [place]. Pt stated no one wants to help her; wanted to know is it b/c she has Medicaid. I told pt we do want to help her; and the best option with the swelling and drainage at this time is the ER. Pt finally agreed to go to the ER now.

## 2024-07-16 NOTE — Telephone Encounter (Addendum)
 FYI Only or Action Required?: FYI only for provider: appointment scheduled on 07/16/24.  Patient was last seen in primary care on 03/11/2024 by Nooruddin, Saad, MD.  Called Nurse Triage reporting Post-op Problem and Leg Swelling.  Symptoms began today.  Interventions attempted: Rest, hydration, or home remedies.  Symptoms are: unchanged.  Triage Disposition: See HCP Within 4 Hours (Or PCP Triage)  Patient/caregiver understands and will follow disposition?: Yes         Copied from CRM #[id number]. Topic: Clinical - Red Word Triage >> Jul 16, 2024 11:55 AM Debby BROCKS wrote: Red Word that prompted transfer to Nurse Triage: Surgery on monday for leg in [facility] and its currently swelling alot and some liquids are coming out and doesnt know what to do Reason for Disposition  SEVERE leg swelling (e.g., swelling extends above knee, entire leg is swollen, weeping fluid)    Pt c/o post-op leg swelling/numbness.  Answer Assessment - Initial Assessment Questions 1. SYMPTOM: What's the main symptom you're concerned about? (e.g., pain, fever, vomiting)    L leg swelling and numbness on one side of leg 2. ONSET: When did sx  start?     today 3. SURGERY: What surgery did you have?     Mass removal on leg 4. DATE of SURGERY: When was the surgery?      07/13/24 5. ANESTHESIA: What type of anesthesia did you have? (e.g., general, spinal, epidural, local)     See chart 6. DRAINS: Were any drains place in or around the wound? (e.g., Hemovac, Jackson-Pratt, Penrose)     Unable to answer 7. PAIN: Is there any pain? If Yes, ask: How bad is it?  (Scale 0-10; or none, mild, moderate, severe)     Pain to touch 8. FEVER: Do you have a fever? If Yes, ask: What is your temperature, how was it measured, and when did it start?     denies 9. VOMITING: Is there any vomiting? If Yes, ask: How many times?     denies 10. BLEEDING: Is there any bleeding? If Yes, ask: How much?  and Where?       Denies active bleeding - endorses blood on sheets in AM 11. OTHER SYMPTOMS: Do you have any other symptoms? (e.g., drainage from wound, painful urination, constipation)       Drainage - black 12. PREGNANCY: Is there any chance you are pregnant? When was your last menstrual  period?       N/a  Protocols used: Post-Op Symptoms and Questions-A-AH, Leg Swelling and Edema-A-AH

## 2024-07-17 MED ORDER — OXYCODONE-ACETAMINOPHEN 5-325 MG PO TABS
1.0000 | ORAL_TABLET | Freq: Once | ORAL | Status: AC
  Administered 2024-07-17: 1 via ORAL
  Filled 2024-07-17: qty 1

## 2024-07-17 NOTE — ED Provider Notes (Signed)
 [place] EMERGENCY DEPARTMENT AT [facility] Provider Note   CSN: [id number] Arrival date & time: 07/16/24  1421     Patient presents with: Post-op Problem   Karen Gibbs is a 53 y.o. female.   HPI     This 53 year old female who presents with concern for bleeding and pain at the surgical incision site.  Patient had a mass removed from her left leg on 11/10 at [facility].  She states that she woke up this morning and noted some blood and fluid on the bed.  It appeared to be leaking through her bandage.  She has not changed her bandage.  She has not had any fevers.  She states she has had some ongoing pain but had difficulty getting pain medication and initially and just yesterday got oxycodone  and took it.  Denies any infectious symptoms.  Prior to Admission medications   Medication Sig Start Date End Date Taking? Authorizing Provider  acetaminophen  (TYLENOL ) 500 MG tablet Take 1 tablet (500 mg total) by mouth every 6 (six) hours as needed. 07/16/24   Montorfano, Lisandro M, MD  gabapentin (NEURONTIN) 300 MG capsule Take 1 capsule (300 mg total) by mouth 2 (two) times daily. 07/16/24   Montorfano, Lisandro M, MD  ibuprofen  (ADVIL ) 600 MG tablet Take 1 tablet (600 mg total) by mouth every 8 (eight) hours as needed. 07/16/24   Montorfano, Lisandro M, MD  acetaminophen  (TYLENOL ) 500 MG tablet Take 1 tablet (500 mg total) by mouth every 6 (six) hours as needed. Patient not taking: Reported on 07/02/2024 05/27/24   Montorfano, Lisandro M, MD  albuterol  (VENTOLIN  HFA) 108 (90 Base) MCG/ACT inhaler TAKE 2 PUFFS BY MOUTH EVERY 6 HOURS AS NEEDED FOR WHEEZE OR SHORTNESS OF BREATH 12/24/23   Nooruddin, Saad, MD  bacitracin  500 UNIT/GM ointment Apply 1 Application topically 2 (two) times daily. 05/27/24   Montorfano, Lisandro M, MD  ibuprofen  (ADVIL ) 600 MG tablet Take 1 tablet (600 mg total) by mouth every 8 (eight) hours as needed. Patient not taking: Reported on 07/02/2024  05/27/24   Montorfano, Lisandro M, MD  losartan  (COZAAR ) 50 MG tablet Take 1 tablet (50 mg total) by mouth daily. 03/11/24 03/11/25  Nooruddin, Saad, MD  norethindrone  (AYGESTIN ) 5 MG tablet Take 1 tablet (5 mg total) by mouth daily. 07/02/24   Ajewole, Christana, MD    Allergies: Patient has no known allergies.    Review of Systems  Constitutional:  Negative for fever.  Respiratory:  Negative for shortness of breath.   Cardiovascular:  Negative for chest pain.  Skin:  Positive for wound.  All other systems reviewed and are negative.   Updated Vital Signs BP (!) 130/111 (BP Location: Right Wrist)   Pulse (!) 103   Temp 97.9 F (36.6 C) (Oral)   Resp 20   Ht 1.575 m (5' 2)   Wt 99.8 kg   LMP 06/09/2024 (Exact Date)   SpO2 100%   BMI 40.24 kg/m   Physical Exam Vitals and nursing note reviewed.  Constitutional:      Appearance: She is well-developed. She is obese. She is not ill-appearing.  HENT:     Head: Normocephalic and atraumatic.  Eyes:     Pupils: Pupils are equal, round, and reactive to light.  Cardiovascular:     Rate and Rhythm: Normal rate and regular rhythm.  Pulmonary:     Effort: Pulmonary effort is normal. No respiratory distress.  Abdominal:     Palpations: Abdomen is soft.  Musculoskeletal:        General: No tenderness.     Cervical back: Neck supple.  Skin:    General: Skin is warm and dry.     Comments: Surgical incision examined and on the posterior aspect of the left upper leg, vertical incision noted with staples in place, there is a moderate amount of blood on the wound dressing.  This was removed.  No obvious active bleeding.  No adjacent erythema.  No fluctuance or induration appreciated.  Neurological:     Mental Status: She is alert and oriented to person, place, and time.  Psychiatric:        Mood and Affect: Mood normal.     (all labs ordered are listed, but only abnormal results are displayed) Labs Reviewed  CBC WITH  DIFFERENTIAL/PLATELET - Abnormal; Notable for the following components:      Result Value   Hemoglobin 10.6 (*)    HCT 35.0 (*)    MCV 73.4 (*)    MCH 22.2 (*)    RDW 16.8 (*)    All other components within normal limits  BASIC METABOLIC PANEL WITH GFR - Abnormal; Notable for the following components:   Calcium 8.4 (*)    All other components within normal limits    EKG: None  Radiology: No results found.   Procedures   Medications Ordered in the ED  oxyCODONE -acetaminophen  (PERCOCET/ROXICET) 5-325 MG per tablet 1 tablet (has no administration in time range)  oxyCODONE -acetaminophen  (PERCOCET/ROXICET) 5-325 MG per tablet 2 tablet (2 tablets Oral Given 07/16/24 1538)  ondansetron  (ZOFRAN -ODT) disintegrating tablet 4 mg (4 mg Oral Given 07/16/24 1538)  oxyCODONE  (Oxy IR/ROXICODONE ) immediate release tablet 10 mg (10 mg Oral Given 07/16/24 2135)                                    Medical Decision Making Risk Prescription drug management.   This patient presents to the ED for concern of wound check, this involves an extensive number of treatment options, and is a complaint that carries with it a high risk of complications and morbidity.  I considered the following differential and admission for this acute, potentially life threatening condition.  The differential diagnosis includes infection, bleeding, hematoma, seroma, cellulitis  MDM:    This is a 53 year old female who presents with concern for wound pain and bleeding through her bandage.  She has a moderate amount of blood on the bandage.  You can see where the bandage lifted from the skin and she likely had some oozing onto her bed sheets from this.  After removal of the bandage, there is no obvious signs of infection or active bleeding.  No wound dehiscence.  No fluctuance or induration to suggest underlying fluid collection or abscess.  Overall very well-appearing postsurgical wound.  Patient was reassured.  Labs with no  significant leukocytosis.  Hemoglobin is 10.6.  Hemoglobin on 11/5 preop was 10.8 per chart review.  This is very reassuring.  Patient was given an additional dose of pain medication.  Wound dressing was reapplied.  Recommend follow-up as scheduled.  (Labs, imaging, consults)  Labs: I Ordered, and personally interpreted labs.  The pertinent results include: CBC, BMP  Imaging Studies ordered: I ordered imaging studies including none I independently visualized and interpreted imaging. I agree with the radiologist interpretation  Additional history obtained from review.  External records from outside source obtained and reviewed including  [facility] records and op note  Cardiac Monitoring: The patient was not maintained on a cardiac monitor.  If on the cardiac monitor, I personally viewed and interpreted the cardiac monitored which showed an underlying rhythm of: N/A  Reevaluation: After the interventions noted above, I reevaluated the patient and found that they have :improved  Social Determinants of Health:  lives independently  Disposition: Discharge  Co morbidities that complicate the patient evaluation  Past Medical History:  Diagnosis Date   Anemia    Environmental allergies    Fibroid      Medicines Meds ordered this encounter  Medications   oxyCODONE -acetaminophen  (PERCOCET/ROXICET) 5-325 MG per tablet 2 tablet    Refill:  0   ondansetron  (ZOFRAN -ODT) disintegrating tablet 4 mg   oxyCODONE  (Oxy IR/ROXICODONE ) immediate release tablet 10 mg    Refill:  0   oxyCODONE -acetaminophen  (PERCOCET/ROXICET) 5-325 MG per tablet 1 tablet    Refill:  0    I have reviewed the patients home medicines and have made adjustments as needed  Problem List / ED Course: Problem List Items Addressed This Visit   None Visit Diagnoses       Visit for wound check    -  Primary     Postoperative pain                    Final diagnoses:  Visit for wound check  Postoperative pain     ED Discharge Orders     None          Modine Oppenheimer, Charmaine FALCON, MD 07/17/24 [phone]

## 2024-07-17 NOTE — Discharge Instructions (Signed)
 Your postoperative wound appears without any evidence of active bleeding or infection.  Follow-up closely with your surgeon at [facility].

## 2024-07-17 NOTE — ED Notes (Signed)
 Mepilex dressing reapplied to wounds/ cleaned with saline and dried well before applying bandage

## 2024-07-28 DIAGNOSIS — D179 Benign lipomatous neoplasm, unspecified: Secondary | ICD-10-CM | POA: Diagnosis not present

## 2024-07-28 DIAGNOSIS — R2242 Localized swelling, mass and lump, left lower limb: Secondary | ICD-10-CM | POA: Diagnosis not present

## 2024-07-28 NOTE — Progress Notes (Addendum)
 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 MDM2 testing was negative  2 wk post op appt., 07-28-2024:  Karen Gibbs returns for her scheduled 2 week post op appt, in order to evaluate incision, function/ROM, and to review pathology and anticipated future follow-up.  She feels she is doing well and denies any fevers, chills, or problems with the incision.  Her pain is controlled (she stopped taking all pain meds on 07-21-2024).     She has experienced some swelling of her left leg.  She also had some bleeding of the incision so she went to a local ED (who redressed).  She has wearing a compression stocking.  There is no bleeding today.   Interval history 07-08-2024: Karen Gibbs presents to clinic today for scheduled follow-up and clinical exam of her left thigh. Since we last saw her in April she has been doing ok. She is not having any pain in her thigh but does feel like this mass has gotten bigger, reports her leg feels heavy. Skin overlying mass is intermittently itchy but otherwise no overlying skin change, no new numbness/weakness. 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   Of note she was seen by Plastics locally at [facility] for a posterior scalp mass excision (05-27-2024), pathology showed a benign pilar cyst. 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   History of Present Illness: Karen Gibbs is a 53 y.o.  female who is seen in consultation, as referred by Julie Machen for further evaluation of a left thigh mass.  History was obtained from the patient, and review of prior medical records obtained pertaining to this presenting problem.  In December 2024 patient noted swelling in her left thigh. She has no  pain, and is walking without issue, although she does at times feel the leg is heavy.  She does think its been slightly growing since January, and thinks her overlying skin has been more itchy in that area.  She is from [place], where her sister is getting treated for breast cancer.  Past Medical History: Past Medical History:  Diagnosis Date   Abnormal mammogram    Anemia    Asthma, unspecified asthma severity, unspecified whether complicated, unspecified whether persistent (HHS-HCC)    Avoidance of blood transfusions except in life-threatening or emergency situations. See [facility]  [facility] Consult note. 07/13/2024   Avoidance of blood transfusions except in life-threatening or emergency situations. See [facility]  Center for Blood Conservation Consult note.    Fibroids    Hypertension    Mass of left thigh     Past Surgical History: Past Surgical History:  Procedure Laterality Date   EXCISION TUMOR THIGH/KNEE Left 07/13/2024   Procedure: EXCISION, TUMOR, SOFT TISSUE OF THIGH OR KNEE AREA, SUBCUTANEOUS; 3 CM OR GREATER, left thigh;  Surgeon: Visgauss, Recardo Morrison, MD;  Location: [facility];  Service: Orthopedics;  Laterality: Left;   CESAREAN SECTION      Family History: Family History  Problem Relation Age of Onset   Breast cancer Sister    Social History: Social History   Socioeconomic History   Marital status: Married  Tobacco Use   Smoking status: Never  Smokeless tobacco: Never  Vaping Use   Vaping status: Never Used  Substance and Sexual Activity   Alcohol use: Never   Drug use: Never   Social Drivers of Corporate Investment Banker Strain: Low Risk  (10/04/2023)   Received from [facility]   Overall Financial Resource Strain (CARDIA)    Difficulty of Paying Living Expenses: Not hard at all  Food Insecurity: Food Insecurity Present (05/19/2024)   Received from [facility]   Hunger Vital Sign    Within the past 12 months, you  worried that your food would run out before you got the money to buy more.: Often true    Within the past 12 months, the food you bought just didn't last and you didn't have money to get more.: Often true  Transportation Needs: No Transportation Needs (05/19/2024)   Received from [facility] - Transportation    In the past 12 months, has lack of transportation kept you from medical appointments or from getting medications?: No    In the past 12 months, has lack of transportation kept you from meetings, work, or from getting things needed for daily living?: No  Stress: No Stress Concern Present (10/04/2023)   Received from [facility] - Occupational Stress Questionnaire    Feeling of Stress : Only a little  Social Connections: Moderately Isolated (10/04/2023)   Received from [facility]   Social Connection and Isolation Panel    In a typical week, how many times do you talk on the phone with family, friends, or neighbors?: More than three times a week    How often do you get together with friends or relatives?: More than three times a week    How often do you attend church or religious services?: Never    Do you belong to any clubs or organizations such as church groups, unions, fraternal or athletic groups, or school groups?: No    How often do you attend meetings of the clubs or organizations you belong to?: Never    Are you married, widowed, divorced, separated, never married, or living with a partner?: Married    Medications: Current Outpatient Medications  Medication Sig Dispense Refill   albuterol  MDI, PROVENTIL , VENTOLIN , PROAIR , HFA 90 mcg/actuation inhaler Inhale 2 Inhalations into the lungs every 6 (six) hours as needed     losartan  (COZAAR ) 25 MG tablet      aspirin 81 MG EC tablet Take 1 tablet (81 mg total) by mouth once daily for 30 days (Patient not taking: Reported on 07/28/2024) 30 tablet 0   norethindrone   (AYGESTIN ) 5 mg tablet Take 5 mg by mouth once daily (Patient not taking: Reported on 07/28/2024)     valsartan -hydroCHLOROthiazide  (DIOVAN -HCT) 80-12.5 mg tablet Take 1 tablet by mouth once daily (Patient not taking: Reported on 07/28/2024)     No current facility-administered medications for this visit.   Allergies: No Known Allergies   Review of Systems: A ROS was performed including pertinent positives and negatives as documented in the HPI.  Physical Exam: BP (!) 149/89 (BP Location: Left forearm, Patient Position: Sitting, BP Cuff Size: Adult)   Pulse 88   Temp 36.6 C (97.9 F) (Oral)   Resp 22   Ht 157.5 cm (5' 2.01)   Wt 100.5 kg (221 lb 9 oz)   LMP 06/09/2024 (Exact Date)   SpO2 99%   BMI 40.51 kg/m  General/Constitutional: No apparent distress: well-nourished and  well developed. Neurological:  Oriented to person, place, and time. Psychological:  Normal mood and affect. Musculoskeletal: Normal, except as noted in detailed exam and in HPI. Focused examination of the left lower extremity reveals  Inspection/palpation: a posterior thigh incision that is clean, dry, and well approximated with staples intact (removed today).  The area has a little overall swelling, but no fluctuance or areas of concern.  No overlying erythema. Gait:  slightly uneven, but steady (no assistive device). Motor: 5/5 strength with hip flexion (after much coaxing) and knee extension/flexion.  No pain with resisted movements (abduction, adduction, hip flexion) or weight bearing, but she seems very hesitant to use her left leg as she does her right.  Vasc:  No distal swelling of left leg Neuro: Neurovascularly intact throughout without edema.    PHOTO TAKEN IN CLINIC  (with patient's permission)       07-28-2025    Imaging:  (No new imaging done today.)    Previous imaging:  MRI left thigh 11/20/23: homogeneous benign lipomatous tumor of the posterior thigh compartment.  Surgical Pathology    07-13-2024 DIAGNOSIS  Left thigh mass, resection:        Lipoma (23 cm).   Comment: The tumor is composed of well differentiated fat with multifocal fat necrosis.  There is no nuclear pleomorphism seen on the H&E stained slides.  A MDM2 FISH is negative for gene amplification ([id number]).  The findings are supportive of the above diagnosis.  Electronically signed by Laurence Ohara, MD on 07/23/2024 at 1613 EST Preliminary result electronically signed by Laurence Ohara, MD on 07/17/2024     Assessment:  This is a 53 y.o. female who is s/p excision of a benign lipomatous lesion of the left posterior thigh on 07-13-2024 with Dr. Huston.  Today we reviewed her pathology, as well as general information about lipoma (see info below).   Today she voices concern about gangrene, which she has seen many times in [place], where she is from.  I addressed this concern as very rare given how excellent her incision looks, and the surgical care that was taken during her surgery.  I will set her up with PT, given her hesitancy with moving her left leg.  I feel she will do very well, and get her function back quickly.   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 Mass of left thigh  R22.42   Post op Plan:  - Staples removed from incision.  Recommended incisional care -  can shower but no soaking until incisions are scarred (usually 4-6 weeks after surgery).   -  Steri strips will start to curl and fall off - gently pull them off if they are still attached in one week.   -  Leave incisions open to air as much as possible (can use thin layer of gauze if clothing irritates area).   -  Do not use lotions, scar preparations until scarred. DO NOT use antibiotic preparations unless specifically instructed to by your orthopedic oncology team -  Protect your scar from excessive sun for the first year (like a baby's skin).  Lip balm with SPF makes this easy to apply and keep handy in your car or  bag -   Anticoagulation therapy:   continue aspirin 81 mg twice daily for full month postop  (through 08-12-2024) -   Activity:  No restrictions -   Continue external compression with ACE wrap and compression sleeve -  External Physical therapy referral placed to get her left leg function equal to her right leg function (she is very leery to use her left leg) - facesheet, order, clinic note and op report sent with Shemicka so she can schedule in [place] -   Patient will contact our office for any concerns about the incision (drainage, redness, fever > 101) via MyChart -  Pt will contact our office when she feels ready to return to work (will need a RTW letter).  Currently she is planning on Dec 1., but will contact us  when she is sure  We discussed the diagnosis and the recommended plan as outlined above. The patient expressed understanding and agreement with the plan. All questions have been answered. We have encouraged the patient to contact us  if any further problems or questions should arise.   SHARLET JINNY PETER, NP Office: [phone] Fax: [phone] Division of Orthopaedic Oncology   Division of Orthopaedic Oncology -                     [facility] Lipoma --   Superficial subcutaneous lipomas are the most common benign soft-tissue masses.   These are benign, meaning they are not cancer.   They consist of mature fat cells enclosed by thin fibrous capsules.  Lipomas can occur on any part of the body and usually develop superficially in the subcutaneous tissue.  Rarely, they may involve fascia or deeper muscular planes. Lipomas present as soft, painless subcutaneous nodules ranging in size from 1 to >10 cm.  They occur most frequently on the trunk and upper extremities and can be round, oval, or multilobulated.    Frequently, patients may have more than one lipoma, and occasionally they may have a genetic condition (familial multiple lipomatosis) characterized by the development of  multiple lipomas in several family members.    Malignant (cancerous) transformation of a lipoma into a liposarcoma is very rare. The diagnosis of lipoma is usually made clinically.   Ultrasound examination can be helpful to distinguish a lipoma from an epidermoid cyst or a ganglion cyst.   If a suspected lipoma causes symptoms (pain or restriction of movement), is rapidly enlarging, or is firm rather than soft, a biopsy or removal from a qualified surgeon is indicated. The treatment of lipomas may be needed due to pain, cosmetic reasons, or concerns over diagnosis.   This treatment would typically be surgical removal of the fat cells and fibrous capsule.  Recurrence of an excised lipoma is not common. Side effects of surgery include scarring, seroma (fluid collection), and hematoma (collection of blood) formation.

## 2024-08-01 ENCOUNTER — Other Ambulatory Visit: Payer: Self-pay

## 2024-08-01 ENCOUNTER — Encounter (HOSPITAL_COMMUNITY): Payer: Self-pay

## 2024-08-01 ENCOUNTER — Ambulatory Visit (HOSPITAL_COMMUNITY)
Admission: EM | Admit: 2024-08-01 | Discharge: 2024-08-01 | Disposition: A | Discharge status: Home / Self Care | Attending: Family Medicine | Admitting: Family Medicine

## 2024-08-01 DIAGNOSIS — R072 Precordial pain: Secondary | ICD-10-CM | POA: Diagnosis not present

## 2024-08-01 HISTORY — DX: Unspecified asthma, uncomplicated: J45.909

## 2024-08-01 NOTE — ED Triage Notes (Addendum)
 Patient c/o intermittent left chest pain that radiates into the left mid back x 2 days. Patient states slight SOB. Patient statesit hurts when I move. It feels like something inside cutting me.  Patient denies taking any medications for symptoms. Patient had surgery on 07/13/24 to remove a mass from her hamstring.

## 2024-08-01 NOTE — ED Provider Notes (Signed)
 [facility]    CSN: [id number] Arrival date & time: 08/01/24  0813      History   Chief Complaint Chief Complaint  Patient presents with   Chest Pain    HPI Karen Gibbs is a 53 y.o. female.    Chest Pain  Here for left-sided chest pain.  It has been going on since November 27.  It is constant and is a sharp pain bothering her and her left chest and radiating around to her left back.  It does worsen with movement.  No cough or fever.  No vomiting and no palpitations.  She also notes that her leg has been swelling on the left.  She had surgery to remove a mass from her leg on November 10.  She tells us  that she has stopped taking oxycodone  a few days ago because of shortness of breath.  She has been short of breath since this pain started.  NKDA  Last menstrual cycle November 10.    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    OB History     Gravida  5   Para  4   Term  4   Preterm  0   AB  1   Living  4      SAB  1   IAB  0   Ectopic  0   Multiple      Live Births  4            Home Medications    Prior to Admission medications   Medication Sig Start Date End Date Taking? Authorizing Provider  acetaminophen  (TYLENOL ) 500 MG tablet Take 1 tablet (500 mg total) by mouth every  6 (six) hours as needed. 07/16/24   Montorfano, Lisandro M, MD  gabapentin  (NEURONTIN ) 300 MG capsule Take 1 capsule (300 mg total) by mouth 2 (two) times daily. 07/16/24   Montorfano, Lisandro M, MD  ibuprofen  (ADVIL ) 600 MG tablet Take 1 tablet (600 mg total) by mouth every 8 (eight) hours as needed. 07/16/24   Montorfano, Lisandro M, MD  acetaminophen  (TYLENOL ) 500 MG tablet Take 1 tablet (500 mg total) by mouth every 6 (six) hours as needed. Patient not taking: Reported on 07/02/2024 05/27/24   Montorfano, Lisandro M, MD  albuterol  (VENTOLIN  HFA) 108 (90 Base) MCG/ACT inhaler TAKE 2 PUFFS BY MOUTH EVERY 6 HOURS AS NEEDED FOR WHEEZE OR SHORTNESS OF BREATH 12/24/23   Nooruddin, Saad, MD  bacitracin  500 UNIT/GM ointment Apply 1 Application topically 2 (two) times daily. Patient not taking: Reported on 08/01/2024 05/27/24   Montorfano, Lisandro M, MD  ibuprofen  (ADVIL ) 600 MG tablet Take 1 tablet (600 mg total) by mouth every 8 (eight) hours as needed. Patient not taking: Reported  on 07/02/2024 05/27/24   Montorfano, Lisandro M, MD  losartan  (COZAAR ) 50 MG tablet Take 1 tablet (50 mg total) by mouth daily. 03/11/24 03/11/25  Nooruddin, Saad, MD  norethindrone  (AYGESTIN ) 5 MG tablet Take 1 tablet (5 mg total) by mouth daily. Patient not taking: Reported on 08/01/2024 07/02/24   Jeralyn Crutch, MD    Family History Family History  Problem Relation Age of Onset   Breast cancer Sister     Social History Social History   Tobacco Use   Smoking status: Never   Smokeless tobacco: Never  Vaping Use   Vaping status: Never Used  Substance Use Topics   Alcohol use: No   Drug use: No     Allergies   Patient has no known allergies.   Review of Systems Review of Systems  Cardiovascular:  Positive for chest pain.     Physical Exam Triage Vital Signs ED Triage Vitals  Encounter Vitals Group     BP 08/01/24 0821 122/88     Girls Systolic BP Percentile --      Girls Diastolic BP  Percentile --      Boys Systolic BP Percentile --      Boys Diastolic BP Percentile --      Pulse Rate 08/01/24 0821 88     Resp 08/01/24 0821 16     Temp 08/01/24 0821 98.4 F (36.9 C)     Temp Source 08/01/24 0821 Oral     SpO2 08/01/24 0821 100 %     Weight --      Height --      Head Circumference --      Peak Flow --      Pain Score 08/01/24 0823 9     Pain Loc --      Pain Education --      Exclude from Growth Chart --    No data found.  Updated Vital Signs BP 122/88 (BP Location: Right Arm)   Pulse 88   Temp 98.4 F (36.9 C) (Oral)   Resp 16   LMP 07/13/2024 (Exact Date)   SpO2 100%   Visual Acuity Right Eye Distance:   Left Eye Distance:   Bilateral Distance:    Right Eye Near:   Left Eye Near:    Bilateral Near:     Physical Exam Vitals reviewed.  Constitutional:      Appearance: She is not toxic-appearing or diaphoretic.     Comments: She is in obvious pain in the exam room  HENT:     Nose: Nose normal.     Mouth/Throat:     Mouth: Mucous membranes are moist.  Eyes:     Extraocular Movements: Extraocular movements intact.     Conjunctiva/sclera: Conjunctivae normal.     Pupils: Pupils are equal, round, and reactive to light.  Cardiovascular:     Rate and Rhythm: Normal rate and regular rhythm.     Heart sounds: No murmur heard. Pulmonary:     Effort: Pulmonary effort is normal. No respiratory distress.     Breath sounds: Normal breath sounds. No stridor. No wheezing, rhonchi or rales.  Chest:     Chest wall: No tenderness.  Musculoskeletal:     Cervical back: Neck supple.  Skin:    Findings: No rash.     Comments: There is no rash on examination of the left back  Neurological:     General: No focal deficit present.     Mental Status: She is  alert and oriented to person, place, and time.  Psychiatric:        Behavior: Behavior normal.      UC Treatments / Results  Labs (all labs ordered are listed, but only abnormal results are  displayed) Labs Reviewed - No data to display  EKG   Radiology No results found.  Procedures Procedures (including critical care time)  Medications Ordered in UC Medications - No data to display  Initial Impression / Assessment and Plan / UC Course  I have reviewed the triage vital signs and the nursing notes.  Pertinent labs & imaging results that were available during my care of the patient were reviewed by me and considered in my medical decision making (see chart for details).     EKG shows normal sinus rhythm without any serious pathology. While chest pain that is worse with movement usually does not indicate a serious pathology, she has other symptoms that are concerning, including leg swelling and shortness of breath.  I have asked her to proceed to the emergency room for further evaluation.  At first she is resistant, but then is agreeable and her family will take her by private car for further evaluation. Final Clinical Impressions(s) / UC Diagnoses   Final diagnoses:  Precordial pain     Discharge Instructions      She will go to the ER     ED Prescriptions   None    PDMP not reviewed this encounter.   Vonna Sharlet POUR, MD 08/01/24 [phone]

## 2024-08-01 NOTE — ED Notes (Signed)
 Patient is being discharged from the Urgent Care and sent to the Emergency Department via POV . Per Dr. Owens, patient is in need of higher level of care due to chest pain and SOB. Patient is aware and verbalizes understanding of plan of care.  Vitals:   08/01/24 0821  BP: 122/88  Pulse: 88  Resp: 16  Temp: 98.4 F (36.9 C)  SpO2: 100%

## 2024-08-01 NOTE — Discharge Instructions (Signed)
She will go to the ER.

## 2024-08-03 ENCOUNTER — Ambulatory Visit: Admitting: Student

## 2024-08-03 ENCOUNTER — Other Ambulatory Visit: Payer: Self-pay

## 2024-08-03 ENCOUNTER — Encounter (HOSPITAL_COMMUNITY): Payer: Self-pay | Admitting: Radiology

## 2024-08-03 ENCOUNTER — Emergency Department (HOSPITAL_COMMUNITY)

## 2024-08-03 ENCOUNTER — Emergency Department (HOSPITAL_COMMUNITY)
Admission: EM | Admit: 2024-08-03 | Discharge: 2024-08-03 | Disposition: A | Discharge status: Home / Self Care | Attending: Emergency Medicine | Admitting: Emergency Medicine

## 2024-08-03 VITALS — BP 176/95 | HR 89 | Temp 98.2°F | Ht 62.0 in | Wt 221.8 lb

## 2024-08-03 DIAGNOSIS — I10 Essential (primary) hypertension: Secondary | ICD-10-CM | POA: Diagnosis not present

## 2024-08-03 DIAGNOSIS — T888XXD Other specified complications of surgical and medical care, not elsewhere classified, subsequent encounter: Secondary | ICD-10-CM | POA: Diagnosis not present

## 2024-08-03 DIAGNOSIS — R071 Chest pain on breathing: Secondary | ICD-10-CM | POA: Insufficient documentation

## 2024-08-03 DIAGNOSIS — Z79899 Other long term (current) drug therapy: Secondary | ICD-10-CM | POA: Insufficient documentation

## 2024-08-03 DIAGNOSIS — R0789 Other chest pain: Secondary | ICD-10-CM | POA: Insufficient documentation

## 2024-08-03 DIAGNOSIS — R0781 Pleurodynia: Secondary | ICD-10-CM | POA: Diagnosis not present

## 2024-08-03 DIAGNOSIS — M79605 Pain in left leg: Secondary | ICD-10-CM | POA: Diagnosis not present

## 2024-08-03 DIAGNOSIS — Z9889 Other specified postprocedural states: Secondary | ICD-10-CM | POA: Diagnosis not present

## 2024-08-03 DIAGNOSIS — R0602 Shortness of breath: Secondary | ICD-10-CM | POA: Diagnosis not present

## 2024-08-03 DIAGNOSIS — T888XXA Other specified complications of surgical and medical care, not elsewhere classified, initial encounter: Secondary | ICD-10-CM | POA: Insufficient documentation

## 2024-08-03 DIAGNOSIS — R600 Localized edema: Secondary | ICD-10-CM | POA: Diagnosis not present

## 2024-08-03 LAB — BASIC METABOLIC PANEL WITH GFR
Anion gap: 10 (ref 5–15)
BUN: 10 mg/dL (ref 6–20)
CO2: 26 mmol/L (ref 22–32)
Calcium: 8.9 mg/dL (ref 8.9–10.3)
Chloride: 101 mmol/L (ref 98–111)
Creatinine, Ser: 0.58 mg/dL (ref 0.44–1.00)
GFR, Estimated: >60 mL/min (ref >60)
Glucose, Bld: 120 mg/dL — ABNORMAL HIGH (ref 70–99)
Potassium: 3.5 mmol/L (ref 3.5–5.1)
Sodium: 137 mmol/L (ref 135–145)

## 2024-08-03 LAB — CBC
HCT: 34.4 % — ABNORMAL LOW (ref 36.0–46.0)
Hemoglobin: 10.4 g/dL — ABNORMAL LOW (ref 12.0–15.0)
MCH: 21.9 pg — ABNORMAL LOW (ref 26.0–34.0)
MCHC: 30.2 g/dL (ref 30.0–36.0)
MCV: 72.4 fL — ABNORMAL LOW (ref 80.0–100.0)
Platelets: 385 K/uL (ref 150–400)
RBC: 4.75 MIL/uL (ref 3.87–5.11)
RDW: 16.6 % — ABNORMAL HIGH (ref 11.5–15.5)
WBC: 6.8 K/uL (ref 4.0–10.5)
nRBC: 0 % (ref 0.0–0.2)

## 2024-08-03 LAB — TROPONIN I (HIGH SENSITIVITY)
Troponin I (High Sensitivity): 4 ng/L (ref <18)
Troponin I (High Sensitivity): 5 ng/L (ref <18)

## 2024-08-03 LAB — HCG, QUANTITATIVE, PREGNANCY: hCG, Beta Chain, Quant, S: 2 m[IU]/mL (ref <5)

## 2024-08-03 LAB — D-DIMER, QUANTITATIVE: D-DIMER: >4.4 mg{FEU}/L — ABNORMAL HIGH (ref 0.00–0.49)

## 2024-08-03 MED ORDER — ONDANSETRON 4 MG PO TBDP: 4.0000 mg | ORAL_TABLET | Freq: Three times a day (TID) | ORAL | 0 refills | Status: DC | PRN

## 2024-08-03 MED ORDER — ONDANSETRON HCL 4 MG/2ML IJ SOLN
4.0000 mg | Freq: Once | INTRAMUSCULAR | Status: DC
  Filled 2024-08-03: qty 2

## 2024-08-03 MED ORDER — IOHEXOL 350 MG/ML SOLN
75.0000 mL | Freq: Once | INTRAVENOUS | Status: AC | PRN
  Administered 2024-08-03: 75 mL via INTRAVENOUS

## 2024-08-03 MED ORDER — MORPHINE SULFATE (PF) 4 MG/ML IV SOLN
4.0000 mg | Freq: Once | INTRAVENOUS | Status: AC
  Administered 2024-08-03: 4 mg via INTRAVENOUS
  Filled 2024-08-03: qty 1

## 2024-08-03 NOTE — ED Notes (Signed)
 Patient transported to CT

## 2024-08-03 NOTE — ED Triage Notes (Signed)
 Pt started having sudden onset of chest pain on Thursday. Seen her doctor today and had a d-dimer collected that resulted at >4.4. Pain localized to left chest and into back. Denies any long distance trips

## 2024-08-03 NOTE — ED Triage Notes (Signed)
 PT was at her PCP office today and they drew a Ddimer and it was grater than 4.40. she was told to come to the emergency department due to suspicion of blood clod. She has had chest pain since last Thursday. She has been seen at urgent care for the same. She endorses the pain is worse when she takes a deep breath and when she moves.

## 2024-08-03 NOTE — Progress Notes (Signed)
 Patient name: Karen Gibbs Date of birth: October 28, 1970 Date of visit: 08/03/24  Subjective  Reason for visit: Abdominal Pain (Left upper quad pain. )  Chest pain - acute onset last Thursday when Karen Gibbs was cooking dinner - radiates from under left breast around to back - feels like something stuck inside - worse with breathing and coughing - improves with Advil  which Karen Gibbs has been taking regularly - associated with some mild shortness of breath for which Karen Gibbs used her inhaler - persistent without much improvement since Thursday - went to urgent care on Thursday, EKG looked reassuring, referred to ED but declined to go then  Left leg pain - status post surgery for excision of lipoma from left posterior thigh on 07/13/24 - ongoing drainage, tenderness, and swelling at site of incision - post-op checkup on 07/28/24 noted well-healing incision, per encounter note Karen Gibbs had no problems then - no fevers  Current Outpatient Medications  Medication Instructions   acetaminophen  (TYLENOL ) 500 mg, Oral, Every 6 hours PRN   acetaminophen  (TYLENOL ) 500 mg, Oral, Every 6 hours PRN   albuterol  (VENTOLIN  HFA) 108 (90 Base) MCG/ACT inhaler TAKE 2 PUFFS BY MOUTH EVERY 6 HOURS AS NEEDED FOR WHEEZE OR SHORTNESS OF BREATH   bacitracin  500 UNIT/GM ointment 1 Application, Topical, 2 times daily   gabapentin  (NEURONTIN ) 300 mg, Oral, 2 times daily   ibuprofen  (ADVIL ) 600 mg, Oral, Every 8 hours PRN   ibuprofen  (ADVIL ) 600 mg, Oral, Every 8 hours PRN   losartan  (COZAAR ) 50 mg, Oral, Daily   norethindrone  (AYGESTIN ) 5 mg, Oral, Daily     Objective  Today's Vitals   08/03/24 1320 08/03/24 1329  BP: (!) 160/93 (!) 176/95  Pulse: 88 89  Temp: 98.2 F (36.8 C)   TempSrc: Oral   SpO2: 98%   Weight: 221 lb 12.8 oz (100.6 kg)   Height: 5' 2 (1.575 m)   PainSc: 6    Body mass index is 40.57 kg/m.   Physical Exam Constitutional:      Appearance: Normal appearance.  Cardiovascular:     Rate and  Rhythm: Normal rate and regular rhythm.  Pulmonary:     Effort: Pulmonary effort is normal. No respiratory distress.  Musculoskeletal:     Comments: No chest or back tenderness.  Skin:    General: Skin is warm and dry.     Comments: Incision per below. Small tunneling area near top of incision line. Irregularly shaped swelling palpable under inferior half of incision, tender, with some overlying inflammatory skin changes.  Neurological:     Mental Status: Karen Gibbs is alert.     Cranial Nerves: No facial asymmetry.  Psychiatric:        Mood and Affect: Affect normal.        Speech: Speech normal.        Behavior: Behavior normal.          Assessment & Plan Chest pain on breathing Acute onset of pleuritic chest pain in patient with recent lower extremity surgery. Associated with some shortness of breath. ACS unlikely, Karen Gibbs was seen in UC and EKG looked reassuring. Wells' criteria puts her in moderate risk group for PE. D-dimer was checked for rule-out, but result returned above reference range. Made call after Karen Gibbs left clinic to refer to ED for workup of chest pain suspicious for PE. Orders:   D-dimer, quantitative (not at [facility])  Fluid collection at surgical site, subsequent encounter Query seroma versus infected fluid collection after surgery for lipoma removal.  Looks like Karen Gibbs has a small area of tunneling under the skin near the top of the suture line. Deferring antibiotics given lack of infectious signs and symptoms. Karen Gibbs'll call operating surgeon's office since this has not improved since her post-operative check-up several days ago.    Return in about 2 weeks (around 08/17/2024).  Ozell Kung MD 08/03/2024, 2:13 PM

## 2024-08-03 NOTE — Patient Instructions (Signed)
 Call your surgeon at [facility] to schedule a follow-up appointment for your leg swelling and pain.  I'm checking your for blood clots with a blood test. If it's positive, you need to go to the emergency room for more testing and treatment.  If you become short of breath or feel faint, you should go to the emergency room right away.  Remember to bring all of the medications that you take (including over the counter medications and supplements) with you to every clinic visit.  This after visit summary is an important review of tests, referrals, and medication changes that were discussed during your visit. If you have questions or concerns, call [phone]. Outside of clinic business hours, call the main hospital at [phone] and ask the operator for the on-call internal medicine resident.   Ozell Kung MD 08/03/2024, 2:11 PM

## 2024-08-03 NOTE — Telephone Encounter (Signed)
 New Referral needed. Please see the message below:   ----- Message ----- From: Waddell Calton CROME Sent: 07/02/2024  11:29 AM EDT To: Brunetta JULIANNA Diamantina Dayton JAYSON Rosan, DO  Thanks for trusting the care of your patient to our office. We would love to schedule an appointment, however after reviewing the patient needs to see general surgery or plastic surgery.     Copied from CRM [id number]. Topic: Referral - Request for Referral >> Jul 28, 2024  3:18 PM Mercer PEDLAR wrote: Did the patient discuss referral with their provider in the last year? Yes (If No - schedule appointment) (If Yes - send message)  Appointment offered? No  Type of order/referral and detailed reason for visit: Patient is requesting a physical therapy referral following her leg surgery. She stated that she received a referral from [facility] but has not been able to find out where the referral was sent to. Patient stated that she was told by [facility] to follow up with her PCP.   Preference of office, provider, location: Soonest availability.   If referral order, have you been seen by this specialty before? No (If Yes, this issue or another issue? When? Where?  Can we respond through MyChart? Yes

## 2024-08-03 NOTE — Assessment & Plan Note (Addendum)
 Acute onset of pleuritic chest pain in patient with recent lower extremity surgery. Associated with some shortness of breath. ACS unlikely, she was seen in UC and EKG looked reassuring. Wells' criteria puts her in moderate risk group for PE. D-dimer was checked for rule-out, but result returned above reference range. Made call after she left clinic to refer to ED for workup of chest pain suspicious for PE. Orders:   D-dimer, quantitative (not at [facility])

## 2024-08-03 NOTE — ED Provider Triage Note (Signed)
 Emergency Medicine Provider Triage Evaluation Note  Karen Gibbs , a 53 y.o. female  was evaluated in triage.  Pt complains of cp. Endorse pleuritic cp and sob x 5 days.  Recently had a large lipoma removed from L leg and report pain and swelling at the site.  No prior hx of PE.  Was seen by PCP who did a d-dimer which was elevated and sent here for further assessment.    Review of Systems  Positive: As above Negative: As above  Physical Exam  BP (!) 164/93   Pulse 87   Temp 98.4 F (36.9 C)   Resp 20   Ht 5' 2 (1.575 m)   Wt 100.2 kg   LMP 07/13/2024 (Exact Date)   SpO2 100%   BMI 40.42 kg/m  Gen:   Awake, no distress   Resp:  Normal effort  MSK:   Moves extremities without difficulty  Other:    Medical Decision Making  Medically screening exam initiated at 7:31 PM.  Appropriate orders placed.  Hitomi Sachdev was informed that the remainder of the evaluation will be completed by another provider, this initial triage assessment does not replace that evaluation, and the importance of remaining in the ED until their evaluation is complete.     Nivia Colon, PA-C 08/03/24 1932

## 2024-08-03 NOTE — ED Provider Notes (Signed)
 [place] EMERGENCY DEPARTMENT AT [facility] Provider Note   CSN: [id number] Arrival date & time: 08/03/24  1815     Patient presents with: Chest Pain   Karen Gibbs is a 53 y.o. female.  Presents today for sudden onset of chest pain on Thursday.  Patient was seen at her primary care and had a D-dimer that came back as >4.4.  Patient reports that the pain is pleuritic in nature and worse when she takes a deep breath or moves.  Patient also endorses mild nausea.  Patient reports lipoma removal 3 weeks ago and discontinuing hormonal contraceptives approximately 1 week ago.  Patient denies long distance trip or history of blood clot.  Patient denies vomiting, fever, chills, cough, congestion, abdominal pain, or any other complaints at this time.  HPI gathered using certified language interpreter.    Chest Pain      Prior to Admission medications   Medication Sig Start Date End Date Taking? Authorizing Provider  acetaminophen  (TYLENOL ) 500 MG tablet Take 1 tablet (500 mg total) by mouth every 6 (six) hours as needed. 07/16/24   Montorfano, Lisandro M, MD  gabapentin  (NEURONTIN ) 300 MG capsule Take 1 capsule (300 mg total) by mouth 2 (two) times daily. 07/16/24   Montorfano, Lisandro M, MD  ibuprofen  (ADVIL ) 600 MG tablet Take 1 tablet (600 mg total) by mouth every 8 (eight) hours as needed. 07/16/24   Montorfano, Lisandro M, MD  ondansetron  (ZOFRAN -ODT) 4 MG disintegrating tablet Take 1 tablet (4 mg total) by mouth every 8 (eight) hours as needed for nausea or vomiting. 08/03/24  Yes Loredana Medellin N, PA-C  acetaminophen  (TYLENOL ) 500 MG tablet Take 1 tablet (500 mg total) by mouth every 6 (six) hours as needed. Patient not taking: Reported on 07/02/2024 05/27/24   Montorfano, Lisandro M, MD  albuterol  (VENTOLIN  HFA) 108 (90 Base) MCG/ACT inhaler TAKE 2 PUFFS BY MOUTH EVERY 6 HOURS AS NEEDED FOR WHEEZE OR SHORTNESS OF BREATH 12/24/23   Nooruddin, Saad, MD  bacitracin  500 UNIT/GM  ointment Apply 1 Application topically 2 (two) times daily. Patient not taking: Reported on 08/01/2024 05/27/24   Montorfano, Lisandro M, MD  ibuprofen  (ADVIL ) 600 MG tablet Take 1 tablet (600 mg total) by mouth every 8 (eight) hours as needed. Patient not taking: Reported on 07/02/2024 05/27/24   Montorfano, Lisandro M, MD  losartan  (COZAAR ) 50 MG tablet Take 1 tablet (50 mg total) by mouth daily. 03/11/24 03/11/25  Nooruddin, Saad, MD  norethindrone  (AYGESTIN ) 5 MG tablet Take 1 tablet (5 mg total) by mouth daily. Patient not taking: Reported on 08/01/2024 07/02/24   Ajewole, Christana, MD    Allergies: Patient has no known allergies.    Review of Systems  Cardiovascular:  Positive for chest pain.    Updated Vital Signs BP (!) 160/103   Pulse 81   Temp 97.6 F (36.4 C) (Oral)   Resp (!) 23   Ht 5' 2 (1.575 m)   Wt 100.2 kg   LMP 07/13/2024 (Exact Date)   SpO2 100%   BMI 40.42 kg/m   Physical Exam Vitals and nursing note reviewed.  Constitutional:      General: She is not in acute distress.    Appearance: She is well-developed.  HENT:     Head: Normocephalic and atraumatic.  Eyes:     Conjunctiva/sclera: Conjunctivae normal.  Cardiovascular:     Rate and Rhythm: Normal rate and regular rhythm.     Heart sounds: Normal heart sounds. No  murmur heard. Pulmonary:     Effort: Pulmonary effort is normal. No tachypnea, accessory muscle usage or respiratory distress.     Breath sounds: Normal breath sounds. No wheezing.  Chest:     Chest wall: No tenderness or crepitus.  Abdominal:     Palpations: Abdomen is soft.     Tenderness: There is no abdominal tenderness.  Musculoskeletal:        General: No swelling.     Cervical back: Neck supple.     Right lower leg: No edema.     Left lower leg: No edema.  Skin:    General: Skin is warm and dry.     Capillary Refill: Capillary refill takes less than 2 seconds.  Neurological:     Mental Status: She is alert.  Psychiatric:         Mood and Affect: Mood normal.     (all labs ordered are listed, but only abnormal results are displayed) Labs Reviewed  BASIC METABOLIC PANEL WITH GFR - Abnormal; Notable for the following components:      Result Value   Glucose, Bld 120 (*)    All other components within normal limits  CBC - Abnormal; Notable for the following components:   Hemoglobin 10.4 (*)    HCT 34.4 (*)    MCV 72.4 (*)    MCH 21.9 (*)    RDW 16.6 (*)    All other components within normal limits  HCG, QUANTITATIVE, PREGNANCY  TROPONIN I (HIGH SENSITIVITY)  TROPONIN I (HIGH SENSITIVITY)    EKG: None  Radiology: CT Angio Chest PE W and/or Wo Contrast Result Date: 08/03/2024 EXAM: CTA CHEST 08/03/2024 09:55:20 PM TECHNIQUE: CTA of the chest was performed after the administration of 75 mL of iohexol (OMNIPAQUE) 350 MG/ML injection. Multiplanar reformatted images are provided for review. MIP images are provided for review. Automated exposure control, iterative reconstruction, and/or weight based adjustment of the mA/kV was utilized to reduce the radiation dose to as low as reasonably achievable. COMPARISON: 11/14/2022 CLINICAL HISTORY: Pulmonary embolism (PE) suspected, low to intermediate prob, positive D-dimer. Pleuritic chest pain and shortness of breath. FINDINGS: PULMONARY ARTERIES: Pulmonary arteries are adequately opacified for evaluation. No acute pulmonary embolus. Main pulmonary artery is normal in caliber. MEDIASTINUM: The heart and pericardium demonstrate no acute abnormality. There is no acute abnormality of the thoracic aorta. Enlarged heterogeneous left thyroid  lobe. LYMPH NODES: No mediastinal, hilar or axillary lymphadenopathy. LUNGS AND PLEURA: Atelectasis in the right middle lobe. Otherwise no focal consolidation or pulmonary edema. No evidence of pleural effusion or pneumothorax. UPPER ABDOMEN: Limited images of the upper abdomen are unremarkable. SOFT TISSUES AND BONES: No acute soft tissue  abnormality. No fracture. IMPRESSION: 1. No pulmonary embolism. 2. Enlarged heterogeneous left thyroid  lobe; recommend non-emergent thyroid  ultrasound for further evaluation per ACR incidental thyroid  nodule guidelines. Electronically signed by: Norman Gatlin MD 08/03/2024 10:11 PM EST RP Workstation: [id number]     Procedures   Medications Ordered in the ED  ondansetron  (ZOFRAN ) injection 4 mg (4 mg Intravenous Not Given 08/03/24 2206)  morphine (PF) 4 MG/ML injection 4 mg (4 mg Intravenous Given 08/03/24 2032)  iohexol (OMNIPAQUE) 350 MG/ML injection 75 mL (75 mLs Intravenous Contrast Given 08/03/24 2155)                                    Medical Decision Making Amount and/or Complexity of Data Reviewed Labs: ordered.  Risk Prescription drug management.   This patient presents to the ED for concern of chest pain, this involves an extensive number of treatment options, and is a complaint that carries with it a high risk of complications and morbidity.  The differential diagnosis includes PE, STEMI, NSTEMI, arrhythmia, anemia, electrolyte abnormality, GERD, costochondritis   Co morbidities / Chronic conditions that complicate the patient evaluation  Hypertension   Additional history obtained:  Additional history obtained from EMR External records from outside source obtained and reviewed including internal medicine notes   Lab Tests:  I Ordered, and personally interpreted labs.  The pertinent results include: Anemia 10.4 which is chronic per historical values, troponin 4, 5, BMP unremarkable, hCG 2   Imaging Studies ordered:  I ordered imaging studies including CTA PE I independently visualized and interpreted imaging which showed No PE, Enlarged Heterogeneous left thyroid  lobe, recommend nonemergent thyroid  ultrasound I agree with the radiologist interpretation   Cardiac Monitoring: / EKG:  The patient was maintained on a cardiac monitor.  I personally viewed and  interpreted the cardiac monitored which showed an underlying rhythm of: Normal sinus rhythm   Problem List / ED Course / Critical interventions / Medication management I ordered medication including morphine and Zofran  I have reviewed the patients home medicines and have made adjustments as needed  Test / Admission - Considered:  Considered for admission further workup however patient's vital signs, physical exam, labs, and imaging are reassuring.  Patient's symptoms likely musculoskeletal in nature.  Patient advised to alternate Tylenol  and Motrin  as needed for pain.  Patient given short course of Zofran  for nausea.  Patient advised to follow-up with primary care for further evaluation and possible treatment of enlarged left thyroid  lobe.  Patient given return precautions.  I feel patient safe for discharge at this time.     Final diagnoses:  Atypical chest pain    ED Discharge Orders          Ordered    ondansetron  (ZOFRAN -ODT) 4 MG disintegrating tablet  Every 8 hours PRN        08/03/24 2226               Francis Ileana SAILOR, PA-C 08/03/24 2226    Freddi Hamilton, MD 08/06/24 1416

## 2024-08-03 NOTE — Assessment & Plan Note (Addendum)
 Query seroma versus infected fluid collection after surgery for lipoma removal. Looks like she has a small area of tunneling under the skin near the top of the suture line. Deferring antibiotics given lack of infectious signs and symptoms. She'll call operating surgeon's office since this has not improved since her post-operative check-up several days ago.

## 2024-08-03 NOTE — Discharge Instructions (Addendum)
 Today you were seen for chest pain.  Your workup on the emergency department was reassuring.  You were found to have a enlarged left thyroid  lobe which will require follow-up ultrasound from your primary care physician.  You may alternate Tylenol  and Motrin  as needed for pain.  Thank you for letting us  treat you today. After reviewing your labs and imaging, I feel you are safe to go home. Please follow up with your PCP in the next several days and provide them with your records from this visit. Return to the Emergency Room if pain becomes severe or symptoms worsen.

## 2024-08-05 ENCOUNTER — Ambulatory Visit: Payer: Self-pay | Admitting: Student

## 2024-08-05 LAB — D-DIMER, QUANTITATIVE

## 2024-08-07 ENCOUNTER — Inpatient Hospital Stay: Admission: RE | Admit: 2024-08-07 | Discharge: 2024-08-07 | Attending: Internal Medicine | Admitting: Internal Medicine

## 2024-08-07 DIAGNOSIS — Z1231 Encounter for screening mammogram for malignant neoplasm of breast: Secondary | ICD-10-CM

## 2024-08-10 ENCOUNTER — Other Ambulatory Visit: Payer: Self-pay

## 2024-08-11 DIAGNOSIS — D1724 Benign lipomatous neoplasm of skin and subcutaneous tissue of left leg: Secondary | ICD-10-CM | POA: Diagnosis not present

## 2024-08-12 ENCOUNTER — Other Ambulatory Visit: Payer: Self-pay

## 2024-08-12 ENCOUNTER — Encounter: Payer: Self-pay | Admitting: Student

## 2024-08-12 ENCOUNTER — Other Ambulatory Visit (HOSPITAL_COMMUNITY): Payer: Self-pay

## 2024-08-12 ENCOUNTER — Ambulatory Visit: Admitting: Student

## 2024-08-12 VITALS — BP 164/93 | HR 87 | Temp 98.1°F | Ht 62.0 in | Wt 219.0 lb

## 2024-08-12 DIAGNOSIS — I10 Essential (primary) hypertension: Secondary | ICD-10-CM

## 2024-08-12 DIAGNOSIS — R600 Localized edema: Secondary | ICD-10-CM

## 2024-08-12 DIAGNOSIS — L7634 Postprocedural seroma of skin and subcutaneous tissue following other procedure: Secondary | ICD-10-CM | POA: Diagnosis not present

## 2024-08-12 DIAGNOSIS — R7303 Prediabetes: Secondary | ICD-10-CM

## 2024-08-12 DIAGNOSIS — E010 Iodine-deficiency related diffuse (endemic) goiter: Secondary | ICD-10-CM | POA: Diagnosis not present

## 2024-08-12 DIAGNOSIS — Z79899 Other long term (current) drug therapy: Secondary | ICD-10-CM | POA: Diagnosis not present

## 2024-08-12 DIAGNOSIS — E049 Nontoxic goiter, unspecified: Secondary | ICD-10-CM | POA: Diagnosis not present

## 2024-08-12 DIAGNOSIS — Z872 Personal history of diseases of the skin and subcutaneous tissue: Secondary | ICD-10-CM | POA: Diagnosis not present

## 2024-08-12 LAB — POCT GLYCOSYLATED HEMOGLOBIN (HGB A1C): HbA1c, POC (controlled diabetic range): 5.9 % (ref 0.0–7.0)

## 2024-08-12 LAB — GLUCOSE, CAPILLARY: Glucose-Capillary: 112 mg/dL — ABNORMAL HIGH (ref 70–99)

## 2024-08-12 MED ORDER — AMLODIPINE-OLMESARTAN 5-40 MG PO TABS
1.0000 | ORAL_TABLET | Freq: Every day | ORAL | 2 refills | Status: AC
  Filled 2024-08-12: qty 30, 30d supply, fill #0
  Filled 2024-09-04: qty 30, 30d supply, fill #1
  Filled 2024-10-05: qty 30, 30d supply, fill #2

## 2024-08-12 NOTE — Assessment & Plan Note (Addendum)
 BP Readings from Last 3 Encounters:  08/12/24 (!) 164/93  08/03/24 (!) 150/81  08/03/24 (!) 176/95   Chronic with poor control of late.  Home measurements are concordant with clinic measurements.  On losartan  50 mg daily which she is taking consistently.  Discontinue losartan  50 mg daily in favor of amlodipine -olmesartan  5-40 mg daily.  Orders:   amLODipine -olmesartan  (AZOR ) 5-40 MG tablet; Take 1 tablet by mouth daily. STOP LOSARTAN .

## 2024-08-12 NOTE — Patient Instructions (Addendum)
 Arrtez de prendre du losartan .  Commencez  prendre de l'amlodipine -olmesartan  une fois par jour pour [employer]ca inc tension artrielle.  Nous allons vrifier votre glycmie et votre fonction thyrodienne aujourd'hui.  Je vous oriente vers un service d'imagerie mdicale pour iac/interactivecorp chographie de votre jambe gauche et de votre glande thyrode.  Srome Un srome est une accumulation de [employer]m corporation qui se manifeste par un gonflement ou une masse. Les sromes se forment l o les science applications international t lss ou coups. Leur taille est variable. Certains sont petits et indolores. D'autres peuvent devenir volumineux et safeco corporation ou une gne. De nombreux sromes disparaissent spontanment, le liquide tant naturellement absorb par l'organisme, tandis que d'autres ncessitent un drainage. Quelles sont les causes ? Les sromes se forment suite  une lsion ou  l'ablation de tissus. Ces lsions tissulaires peuvent survenir lors d'une intervention chirurgicale ou  la suite d'une blessure ou d'un traumatisme. Lorsque les tissus air products and chemicals ou retirs, un espace vide se cre. Le systme de dfense de l'organisme (systme immunitaire) provoque l'accumulation de j[employer], formant ainsi un srome. Quels sont les signes et symptmes ? Les symptmes de cette affection comprennent :  Un gonflement au niveau de l'incision chirurgicale ou de la blessure.  Un coulement de liquide clair au niveau du site de omnicom ou de la blessure.  Une gne ou lyondell chemical. Comment est-il diagnostiqu ? Le diagnostic repose sur :  Vos symptmes.  Vos antcdents mdicaux.  Un examen physique. Lors de materials engineer, votre mdecin palpera le srome. Des examens complmentaires peuvent galement tre raliss, tels que :  Des analyses de sang.  Une chographie, un scanner ou d'autres examens d'imagerie. Comment est-il trait ? Certains sromes disparaissent spontanment. Votre  mdecin peut surveiller le srome afin de s'assurer qu'il ne cause aucun problme. Les sromes qui ne disparaissent pas d'eux-mmes peuvent tre traits. Le traitement peut inclure :  Le drainage du liquide du srome  l'aide d'une aiguille (ponction).  La pose d'un petit tube fin (cathter) pour drainer le liquide.  L'application d'un pansement, tel qu'un bandage lastique ou une bande de contention.  La prise d'antibiotiques, en cas d'infection du srome. Dans de rares cas, une intervention chirurgicale peut tre ncessaire pour retirer le srome et rparer la zone fifth third bancorp. Contactez un professionnel de sant si :  Vous avez de la fivre.  Vous avez des rougeurs ou des mattel niveau de votre srome.  Votre srome est plus enfl ou grossit.  Vous constatez un coulement de liquide plus important au niveau de votre srome.  Votre srome est chaud au toucher.  Vous constatez la prsence de pus ou une mauvaise odeur au niveau de votre srome. Consultez immdiatement un mdecin si :  Vous avez de la fivre accompagne de fortes douleurs, de rougeurs au niveau de la zone [place] ou de frissons.  Vous tes confus ou avez du mal  rester veill.  Vous avez l'impression que votre cur bat trs vite.  Vous avez des difficults  respirer ou respirez rapidement.  Votre peau est froide, moite ou transpire abondamment. Rsum  Les hexion specialty chemicals se former chief technology officer blessure ou une intervention chirurgicale sur les tissus.  Les sromes [employer] corporation un gonflement, un coulement de liquide clair et une gne ou des douleurs au niveau du site de la chirurgie ou de la blessure.  Certains sromes disparaissent spontanment. D'autres peuvent ncessiter un drainage.  Examinez votre srome quotidiennement pour health net  d'infection. Les signes incluent des rougeurs, des douleurs, un gonflement accru, un coulement de liquide plus important, une sensation de training and development officer, la prsence de pus ou  une mauvaise odeur. Ces informations ne remplacent pas les conseils de votre professionnel de sant. N'hsitez pas  lui poser toutes vos questions.  Ozell Kung MD 08/12/2024, 10:35 AM

## 2024-08-12 NOTE — Progress Notes (Signed)
 Internal Medicine Clinic Attending  Case discussed with the resident at the time of the visit.  We reviewed the resident's history and exam and pertinent patient test results.  I agree with the assessment, diagnosis, and plan of care documented in the resident's note.

## 2024-08-12 NOTE — Progress Notes (Signed)
 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 - Still painful with a tender area of swelling over inferior portion of scar - Tunneling area at the superior portion draining less, no drainage noted today - Saw surgeon yesterday, they note she is healing well - No fevers, taking ibuprofen  for pain control  Left leg swelling - Reports occasional ankle swelling primarily left-sided after prolonged period of time on her feet  Hypertension - Home measurements consistently 140s 150s systolic - Good adherence to losartan  50 mg daily - Notes higher blood pressures after her surgery 1 month ago  Left-sided pleuritic thoracic pain - Went to ED last week after elevated D-dimer, CTA PE protocol ruled out pulmonary embolism  Thyromegaly - CTA PE protocol incidentally found asymmetrically enlarged left thyroid  lobe  Outpatient Medications Prior to Visit  Medication Sig   acetaminophen  (TYLENOL ) 500 MG tablet Take 1 tablet (500 mg total) by mouth every 6 (six) hours as needed.   gabapentin  (NEURONTIN ) 300 MG capsule Take 1 capsule (300 mg total) by mouth 2 (two) times daily.   ibuprofen  (ADVIL ) 600 MG tablet Take 1 tablet (600 mg total) by mouth every 8 (eight) hours as needed.   acetaminophen  (TYLENOL ) 500 MG tablet Take 1 tablet (500 mg total) by mouth every 6 (six) hours as needed. (Patient not taking: Reported on 07/02/2024)   albuterol  (VENTOLIN  HFA) 108 (90 Base) MCG/ACT inhaler TAKE 2 PUFFS BY MOUTH EVERY 6 HOURS AS NEEDED FOR WHEEZE OR SHORTNESS OF BREATH   bacitracin  500 UNIT/GM ointment Apply 1 Application topically 2 (two) times daily. (Patient not taking: Reported on 08/01/2024)   norethindrone  (AYGESTIN ) 5 MG tablet Take 1 tablet (5 mg total) by mouth daily. (Patient not taking:  Reported on 08/01/2024)   [DISCONTINUED] ibuprofen  (ADVIL ) 600 MG tablet Take 1 tablet (600 mg total) by mouth every 8 (eight) hours as needed. (Patient not taking: Reported on 07/02/2024)   [DISCONTINUED] losartan  (COZAAR ) 50 MG tablet Take 1 tablet (50 mg total) by mouth daily.   No facility-administered medications prior to visit.     Objective  Today's Vitals   08/12/24 0919  BP: (!) 164/93  Pulse: 87  Temp: 98.1 F (36.7 C)  TempSrc: Oral  SpO2: 97%  Weight: 219 lb (99.3 kg)  Height: 5' 2 (1.575 m)  PainSc: 6   PainLoc: Abdomen  Body mass index is 40.06 kg/m.   Physical Exam Constitutional:      Appearance: Normal appearance.  Neck:     Comments: Diffuse thyroid  enlargement noted. No discrete nodules. Cardiovascular:     Rate and Rhythm: Normal rate and regular rhythm.  Pulmonary:     Effort: Pulmonary effort is normal. No respiratory distress.  Musculoskeletal:     Right lower leg: No edema.     Left lower leg: No edema.  Skin:    General: Skin is warm and dry.     Comments: Well-healing left posterior thigh surgical incision, with the small tunneling area in the superior portion of the scar having close since last visit, no serosanguineous drainage.  Persistent tender swelling noted primarily under inferior portion of scar but this seems smaller relative to last visit's exam.  Neurological:     Mental Status: She is alert.     Cranial Nerves: No facial asymmetry.  Psychiatric:  Mood and Affect: Affect normal.        Speech: Speech normal.        Behavior: Behavior normal.     Assessment & Plan Edema of left lower extremity Postoperative seroma improving.  She is having some asymmetric lower extremity swelling distal to operative site after her surgery, primarily after a long day on her feet.  Although CTA PE protocol ruled out PE, I do not have a good explanation for elevated D-dimer thus I am referring her for venous ultrasound of the left lower  extremity for rule out of DVT.  Of note she completed a course of daily aspirin for postoperative DVT prophylaxis. Orders:   VAS US  LOWER EXTREMITY VENOUS (DVT); Future  Hypertension, unspecified type BP Readings from Last 3 Encounters:  08/12/24 (!) 164/93  08/03/24 (!) 150/81  08/03/24 (!) 176/95   Chronic with poor control of late.  Home measurements are concordant with clinic measurements.  On losartan  50 mg daily which she is taking consistently.  Discontinue losartan  50 mg daily in favor of amlodipine -olmesartan  5-40 mg daily.  Orders:   amLODipine -olmesartan  (AZOR ) 5-40 MG tablet; Take 1 tablet by mouth daily. STOP LOSARTAN .  Prediabetes Lab Results  Component Value Date   HGBA1C 5.6 09/19/2022   HGBA1C 5.5 04/28/2020   HGBA1C 6.1 (H) 12/30/2019   Chronic and stable, she wishes for follow-up of A1c today.  Orders:   POCT glycosylated hemoglobin (Hb A1C)  Thyromegaly Incidental finding on CTA chest for PE rule out.  Asymmetric enlargement of left thyroid  lobe.  Palpable thyromegaly on exam.  Check TSH and thyroid  ultrasound.  TSH  Date Value Ref Range Status  09/19/2022 1.920 0.450 - 4.500 uIU/mL Final  12/30/2019 2.530 0.450 - 4.500 uIU/mL Final  07/15/2018 1.560 0.450 - 4.500 uIU/mL Final   T4, Total  Date Value Ref Range Status  09/19/2022 4.7 4.5 - 12.0 ug/dL Final   Orders:   TSH   US  THYROID ; Future  Return in about 3 months (around 11/10/2024), or sooner if necessary.  Ozell Kung MD 08/12/2024, 10:54 AM

## 2024-08-13 ENCOUNTER — Ambulatory Visit: Payer: Self-pay | Admitting: Student

## 2024-08-13 DIAGNOSIS — E042 Nontoxic multinodular goiter: Secondary | ICD-10-CM

## 2024-08-13 LAB — TSH: TSH: 1.53 u[IU]/mL (ref 0.450–4.500)

## 2024-08-14 ENCOUNTER — Other Ambulatory Visit: Payer: Self-pay | Admitting: Student

## 2024-08-14 DIAGNOSIS — N63 Unspecified lump in unspecified breast: Secondary | ICD-10-CM

## 2024-08-19 NOTE — Progress Notes (Signed)
 Internal Medicine Clinic Attending Patient was precepted with Dr. Karna who has since gone out on medical leave.  I have reviewed the residents history and exam and pertinent patient test results.  I agree with the assessment, diagnosis, and plan of care documented in the residents note.

## 2024-08-20 ENCOUNTER — Ambulatory Visit (HOSPITAL_COMMUNITY): Admission: RE | Admit: 2024-08-20

## 2024-08-20 DIAGNOSIS — E010 Iodine-deficiency related diffuse (endemic) goiter: Secondary | ICD-10-CM | POA: Diagnosis not present

## 2024-08-24 ENCOUNTER — Ambulatory Visit: Payer: Self-pay

## 2024-08-24 ENCOUNTER — Other Ambulatory Visit: Payer: Self-pay | Admitting: *Deleted

## 2024-08-24 DIAGNOSIS — E042 Nontoxic multinodular goiter: Secondary | ICD-10-CM | POA: Insufficient documentation

## 2024-08-24 NOTE — Telephone Encounter (Signed)
 Nurse attempted to reach  out to patient: no answer: left voicemail

## 2024-08-24 NOTE — Addendum Note (Signed)
 Addended by: NORRINE SHARPER on: 08/24/2024 01:17 PM   Modules accepted: Orders

## 2024-08-24 NOTE — Telephone Encounter (Signed)
 FYI Only or Action Required?: FYI only for provider: ED advised, patient declined.  Patient was last seen in primary care on 08/12/2024 by Norrine Sharper, MD.  Called Nurse Triage reporting Chest Pain.  Symptoms began several weeks ago.  Interventions attempted: OTC medications: ibuprofen , Advil .  Symptoms are: gradually worsening since Saturday.  Triage Disposition: Call EMS 911 Now  Patient/caregiver understands and will follow disposition?: No, refuses disposition                Reason for Disposition  [1] Chest pain lasts > 5 minutes AND [2] age > 37  Answer Assessment - Initial Assessment Questions 1. LOCATION: Where does it hurt?       Left side under breast.  2. RADIATION: Does the pain go anywhere else? (e.g., into neck, jaw, arms, back)     Back.  3. ONSET: When did the chest pain begin? (Minutes, hours or days)      07/30/24, patient states she has been seen twice in office and was sent to the ED on 08/03/14 to rule out blood clots in lungs  4. PATTERN: Does the pain come and go, or has it been constant since it started?  Does it get worse with exertion?      Patient states comes and goes and notices the pain if moving, none at rest. Patient then states she always has the pain, it is constant and just gets worse with movement.  5. DURATION: How long does it last (e.g., seconds, minutes, hours)     Patient states it is always present. > hours.  6. SEVERITY: How bad is the pain?  (e.g., Scale 1-10; mild, moderate, or severe)     7/10. She states she was treating with ibuprofen  but ran out and was told to stop taking Advil . Patient asked if okay to continue taking ibuprofen , educated that if she was told to stop taking Advil , she should stop ibuprofen  as that is the same medication just brand vs generic names.  RN stopped triage assessment due to patient states the chest pain has been worsening since Saturday and has been constant today.  Advised ED or call 911 and patient states she can't stay there for 12 hours. She would like to follow up with Dr Garrel. She also states she already spoke with him today. No documentation showing conversation with MD per chart review. Called [facility] and notified May.  Protocols used: Chest Pain-A-AH

## 2024-08-24 NOTE — Telephone Encounter (Signed)
 Called pt - no answer; left message on vm to go to the ER for c/o CP.

## 2024-08-24 NOTE — Telephone Encounter (Signed)
 Copied from CRM [id number]. Topic: Clinical - Red Word Triage >> Aug 24, 2024  1:20 PM Carrielelia G wrote: [facility] that prompted transfer to Nurse Triage: chest pain  PAS called stated pt no longer on the line: PAS placed nurse on the line and attempted to call pt back: PAS stated pt did not answer and PAS left voicemail

## 2024-08-25 NOTE — Telephone Encounter (Signed)
 Talked to pt - appt scheduled with Dr Norrine tomorrow 08/26/24.

## 2024-08-26 ENCOUNTER — Ambulatory Visit: Payer: Self-pay | Admitting: Student

## 2024-08-26 VITALS — BP 134/89 | HR 84 | Temp 97.8°F | Ht 62.0 in | Wt 218.8 lb

## 2024-08-26 DIAGNOSIS — R071 Chest pain on breathing: Secondary | ICD-10-CM | POA: Diagnosis not present

## 2024-08-26 DIAGNOSIS — R600 Localized edema: Secondary | ICD-10-CM

## 2024-08-26 NOTE — Progress Notes (Signed)
" °  Patient name: Karen Gibbs Date of birth: Jan 08, 1971 Date of visit: 08/26/2024  Subjective  Reason for visit: Pain (Still having pain under left breast area going toward her back)  Discussed the use of AI scribe software for clinical note transcription with the patient, who gave verbal consent to proceed.  History of Present Illness           Show/hide medication list[1]   Objective  Today's Vitals   08/26/24 0850  BP: 134/89  Pulse: 84  Temp: 97.8 F (36.6 C)  TempSrc: Oral  SpO2: 100%  Weight: 218 lb 12.8 oz (99.2 kg)  Height: 5' 2 (1.575 m)  PainSc: 8   PainLoc: Breast  Body mass index is 40.02 kg/m.   Physical Exam Physical Exam         Results           Assessment & Plan  No follow-ups on file.  Ozell Kung MD 08/26/2024, 8:57 AM           [1] Outpatient Medications Prior to Visit  Medication Sig   acetaminophen  (TYLENOL ) 500 MG tablet Take 1 tablet (500 mg total) by mouth every 6 (six) hours as needed.   gabapentin  (NEURONTIN ) 300 MG capsule Take 1 capsule (300 mg total) by mouth 2 (two) times daily.   ibuprofen  (ADVIL ) 600 MG tablet Take 1 tablet (600 mg total) by mouth every 8 (eight) hours as needed.   acetaminophen  (TYLENOL ) 500 MG tablet Take 1 tablet (500 mg total) by mouth every 6 (six) hours as needed. (Patient not taking: Reported on 07/02/2024)   albuterol  (VENTOLIN  HFA) 108 (90 Base) MCG/ACT inhaler TAKE 2 PUFFS BY MOUTH EVERY 6 HOURS AS NEEDED FOR WHEEZE OR SHORTNESS OF BREATH   amLODipine -olmesartan  (AZOR ) 5-40 MG tablet Take 1 tablet by mouth daily. STOP LOSARTAN .   bacitracin  500 UNIT/GM ointment Apply 1 Application topically 2 (two) times daily. (Patient not taking: Reported on 08/01/2024)   norethindrone  (AYGESTIN ) 5 MG tablet Take 1 tablet (5 mg total) by mouth daily. (Patient not taking: Reported on 08/01/2024)   No facility-administered medications prior to visit.  "

## 2024-08-26 NOTE — Patient Instructions (Signed)
 VISIT SUMMARY: During your visit, we discussed your persistent chest pain and leg swelling. We reviewed your symptoms and provided recommendations for managing your pain and swelling. We also discussed your hypertension and its management.  YOUR PLAN: MUSCULOSKELETAL CHEST WALL PAIN: You have intermittent chest pain that worsens with movement, likely due to musculoskeletal issues. Cardiac and lung problems have been ruled out. -Rest and avoid activities that worsen the pain. -Take Tylenol , two extra strength tablets every six hours as needed. -You can take ibuprofen  200-400 mg for severe pain, but do not use it excessively. -Avoid using oxycodone . -Seek medical attention if you experience severe pain, shortness of breath, fainting, or passing out.  LEFT LOWER EXTREMITY SWELLING: You have swelling in your left leg, which is improving but needs further evaluation for possible deep vein thrombosis (DVT). -An ultrasound of your left leg has been ordered to check for DVT.  HYPERTENSION: Your blood pressure is well-controlled with your current medication. -Continue taking your current antihypertensive medications as prescribed.  Take your prescription medications as usual on the day of your doctor visit. Unless specifically instructed, there is no need to fast prior to laboratory blood testing.  Bring all of the medications that you take (including over the counter medications and supplements) with you to every clinic visit.  This after visit summary is an important review of tests, referrals, and medication changes that were discussed during your visit. If you have questions or concerns, call [phone]. Outside of clinic business hours, call the main hospital at [phone] and ask the operator for the on-call internal medicine resident.   Ozell Kung MD 08/26/2024, 9:26 AM

## 2024-08-26 NOTE — Progress Notes (Signed)
 " Patient name: Karen Gibbs Date of birth: 1971/06/17 Date of visit: 08/26/2024  Subjective  Reason for visit: Pain (Still having pain under left breast area going toward her back)  Discussed the use of AI scribe software for clinical note transcription with the patient, who gave verbal consent to proceed.  History of Present Illness   Karen Gibbs is a 53 year old female who presents with persistent chest pain and leg swelling.  Chest pain - Persistent left-sided chest pain since July 30, 2024 - Initially severe, prompting emergency room visit; improved with medication, then worsened again around August 22, 2024 - Pain is tender to palpation, radiates to the back - Worse with deep breathing, coughing, and laughing - No associated rash or skin changes  Lower extremity edema - Recurrent leg swelling, worse in the left leg - Swelling occurs during the day, currently improving - No redness or pain associated with swelling - Previously had focal pain when sitting on the leg, now improving  Associated symptoms - Nausea multiple times daily, uses prior emergency room prescription for nausea as needed - Decreased appetite without vomiting - Palpitations with exertion, especially when climbing stairs  Functional status - Works three days a week - Difficulty performing household tasks due to pain  Hypertension - Takes antihypertensive medication, feels blood pressure is controlled  Hyperglycemia - History of elevated blood sugar, no further details available       Show/hide medication list[1]   Objective  Today's Vitals   08/26/24 0850  BP: 134/89  Pulse: 84  Temp: 97.8 F (36.6 C)  TempSrc: Oral  SpO2: 100%  Weight: 218 lb 12.8 oz (99.2 kg)  Height: 5' 2 (1.575 m)  PainSc: 8   PainLoc: Breast  Body mass index is 40.02 kg/m.   Physical Exam Constitutional:      Appearance: Normal appearance.  Cardiovascular:     Rate and Rhythm: Normal rate and  regular rhythm.     Pulses: Normal pulses.     Heart sounds: No murmur heard. Pulmonary:     Effort: Pulmonary effort is normal. No respiratory distress.     Breath sounds: No wheezing or rales.  Chest:     Comments: Thoracic just left of subxiphoid area Musculoskeletal:     Comments: No lower extremity swelling  Skin:    General: Skin is warm and dry.     Comments: Appropriately healing incision left posterior thigh  Neurological:     Mental Status: She is alert.     Cranial Nerves: No facial asymmetry.  Psychiatric:        Mood and Affect: Affect normal.        Speech: Speech normal.        Behavior: Behavior normal.     Assessment & Plan Chest pain on breathing Ongoing pleuritic thoracic pain.  PE was ruled out the beginning of the month after D-dimer came back elevated.  EKGs have been normal.  I think she has a muscular or diaphragmatic strain.  Continue supportive care and expectant management.    Edema of left lower extremity Seems resolved on this visit.  Her thigh incision is healing well.  Still do not have a good explanation for the elevated D-dimer, so I still want to proceed with the left lower extremity DVT ultrasound.    Return in about 1 month (around 09/28/2024) for chronic condition management and thoracic pain, or sooner if needed.  Ozell Kung MD 08/26/2024, 12:27 PM         [  1]  Outpatient Medications Prior to Visit  Medication Sig   acetaminophen  (TYLENOL ) 500 MG tablet Take 1 tablet (500 mg total) by mouth every 6 (six) hours as needed.   gabapentin  (NEURONTIN ) 300 MG capsule Take 1 capsule (300 mg total) by mouth 2 (two) times daily.   ibuprofen  (ADVIL ) 600 MG tablet Take 1 tablet (600 mg total) by mouth every 8 (eight) hours as needed.   acetaminophen  (TYLENOL ) 500 MG tablet Take 1 tablet (500 mg total) by mouth every 6 (six) hours as needed. (Patient not taking: Reported on 07/02/2024)   albuterol  (VENTOLIN  HFA) 108 (90 Base) MCG/ACT  inhaler TAKE 2 PUFFS BY MOUTH EVERY 6 HOURS AS NEEDED FOR WHEEZE OR SHORTNESS OF BREATH   amLODipine -olmesartan  (AZOR ) 5-40 MG tablet Take 1 tablet by mouth daily. STOP LOSARTAN .   bacitracin  500 UNIT/GM ointment Apply 1 Application topically 2 (two) times daily. (Patient not taking: Reported on 08/01/2024)   norethindrone  (AYGESTIN ) 5 MG tablet Take 1 tablet (5 mg total) by mouth daily. (Patient not taking: Reported on 08/01/2024)   No facility-administered medications prior to visit.   "

## 2024-08-26 NOTE — Assessment & Plan Note (Signed)
 Ongoing pleuritic thoracic pain.  PE was ruled out the beginning of the month after D-dimer came back elevated.  EKGs have been normal.  I think she has a muscular or diaphragmatic strain.  Continue supportive care and expectant management.

## 2024-08-28 ENCOUNTER — Ambulatory Visit: Admitting: Obstetrics and Gynecology

## 2024-08-28 ENCOUNTER — Other Ambulatory Visit: Payer: Self-pay

## 2024-08-28 ENCOUNTER — Encounter: Payer: Self-pay | Admitting: Obstetrics and Gynecology

## 2024-08-28 VITALS — BP 128/84 | HR 99 | Wt 218.7 lb

## 2024-08-28 DIAGNOSIS — Z758 Other problems related to medical facilities and other health care: Secondary | ICD-10-CM

## 2024-08-28 DIAGNOSIS — R2242 Localized swelling, mass and lump, left lower limb: Secondary | ICD-10-CM | POA: Diagnosis not present

## 2024-08-28 DIAGNOSIS — Z603 Acculturation difficulty: Secondary | ICD-10-CM | POA: Diagnosis not present

## 2024-08-28 DIAGNOSIS — N939 Abnormal uterine and vaginal bleeding, unspecified: Secondary | ICD-10-CM | POA: Diagnosis not present

## 2024-08-28 NOTE — Progress Notes (Signed)
 "   GYNECOLOGY VISIT  Patient name: Karen Gibbs MRN [id number]  Date of birth: 18-Nov-1970 Chief Complaint:   Follow-up  History:  Karen Gibbs here for follow up of AUB. Period started on the 10th and continued until 25th of November while taking aygestin  and so stopped and bleeding subsequently stopped. This month bleeding has not resumed. Leg still swollen and painful from lipoma removal - done at [facility]. [facility] that [facility]. Reviewed that uterus is removal of uterus, tubes and cervix, ovaries can remain or be removed pending cervix. If bleeding returns heavy or prolonged, would have hysterectomy but after leg has completeley healed. Would prefer to have ovaries removed if surgery pursued.   Feels that certain hormones don't work well with body. Had bleeding with IUD in place that stopped when removed. Sister had menses until 73 yo.   The following portions of the patient's history were reviewed and updated as appropriate: allergies, current medications, past family history, past medical history, past social history, past surgical history and problem list.    Health Maintenance:   Last pap     Component Value Date/Time   DIAGPAP  05/19/2024 1036    - Negative for intraepithelial lesion or malignancy (NILM)   DIAGPAP  07/15/2018 0000    NEGATIVE FOR INTRAEPITHELIAL LESIONS OR MALIGNANCY.   HPVHIGH Negative 05/19/2024 1036   ADEQPAP  05/19/2024 1036    Satisfactory for evaluation; transformation zone component ABSENT.   ADEQPAP  07/15/2018 0000    Satisfactory for evaluation  endocervical/transformation zone component PRESENT.    Health Maintenance  Topic Date Due   COVID-19 Vaccine (1) Never done   Pneumococcal Vaccine for age over 19 (1 of 2 - PCV) Never done   Hepatitis B Vaccine (1 of 3 - 19+ 3-dose series) Never done   Zoster (Shingles) Vaccine (1 of 2) Never done   Cologuard (Stool DNA test)  Never done   Breast Cancer Screening  07/17/2024    Pap with HPV screening  05/19/2029   Flu Shot  Completed   Hepatitis C Screening  Completed   HIV Screening  Completed   HPV Vaccine  Aged Out   Meningitis B Vaccine  Aged Out   DTaP/Tdap/Td vaccine  Discontinued      Review of Systems:  Pertinent items are noted in HPI. Comprehensive review of systems was otherwise negative.   Objective:  Physical Exam BP 128/84   Pulse 99   Wt 218 lb 11.2 oz (99.2 kg)   LMP 07/13/2024 (Exact Date)   BMI 40.00 kg/m    Physical Exam Vitals and nursing note reviewed.  Constitutional:      Appearance: Normal appearance.  HENT:     Head: Normocephalic and atraumatic.  Pulmonary:     Effort: Pulmonary effort is normal.  Musculoskeletal:     Comments: Healed posterior left thigh incision with tenderness on lateral, inferior aspect of scar    Skin:    General: Skin is warm and dry.  Neurological:     General: No focal deficit present.     Mental Status: She is alert.  Psychiatric:        Mood and Affect: Mood normal.        Behavior: Behavior normal.        Thought Content: Thought content normal.        Judgment: Judgment normal.     Labs and Imaging Endometrial biopsy FINAL MICROSCOPIC DIAGNOSIS:   A. ENDOMETRIUM,  BIOPSY:  - Proliferative endometrium.  - Fragments of unremarkable endocervical mucosa.  - Negative for atypia/EIN and malignancy.   FINDINGS:   UTERUS: Uterus measures 9.9 x 5.7 x 7.0 cm (205 ml). Multiple uterine fibroids, including a dominant 3.4 x 2.8 x 3.5 cm subserosal fibroid in the right posterior uterine body.   ENDOMETRIAL STRIPE: Endometrial measures 5 mm. Endometrial stripe is within normal limits.   RIGHT OVARY: Right ovary is not discretely visualized.   LEFT OVARY: Left ovary measures 4.1 x 1.4 x 2.9 cm (8.7 ml). Left ovary is within normal limits. There is normal arterial and venous Doppler flow.   FREE FLUID: No free fluid.   IMPRESSION: 1. Multiple uterine fibroids, as above. 2.  Otherwise negative. 3. Right ovary not discretely visualized.     Media Information        Assessment & Plan:   1. Abnormal uterine bleeding (AUB) (Primary) Has not had relief with medical interventions, so if bleeding returns heavy and/or prolonged, would likely prefer surgical, definitive management with hysterectomy. Would prefer oophorectomy at time of hysterectomy but hopefully menses have ceased. If hysterectomy scheduled, would like to have leg completely healed first.   2. Mass of left thigh States [facility] will not [facility] again but still having incisions/pain at surgical site and would prefer to have follow up with previously established engineer, petroleum. Message sent but noted that plastic surgeon may still encourage trying to be seen by [facility]/original surgeon who did surgery.   3. Language barrier In person interpreter used for encounter    Carter Quarry, MD Minimally Invasive [facility]nec[facility], [facility] "

## 2024-08-31 ENCOUNTER — Telehealth: Payer: Self-pay

## 2024-08-31 NOTE — Telephone Encounter (Addendum)
 Called pt with [facility] [phone].I informed pt of Dr. Ritchie message from Dr.Montorfano.  Pt understands what provider is saying but insists that The original doctor tells her to wait a year to heal.  I advised pt to reach back out to her original surgery practice she voices being upset with her original office not listening and plans to speak with her lawyer.  Offered my condolences for struggles with her leg concerns. Pt appreciative of Dr. Jeralyn reaching out to another doctor.     Waddell, RN  ----- Message from Carter Jeralyn, MD sent at 08/31/2024  7:23 AM EST ----- Hello,  Can someone contact patient and let her know that Dr. Montorfano also recommends that the surgeon who performed the original surgery has to see her and take care of the problem/complication first but they are happy to follow up afterwards.  Thank you,  Ajewole

## 2024-09-01 NOTE — Progress Notes (Signed)
 Internal Medicine Clinic Attending  Case discussed with the resident at the time of the visit.  We reviewed the resident's history and exam and pertinent patient test results.  I agree with the assessment, diagnosis, and plan of care documented in the resident's note.

## 2024-09-04 ENCOUNTER — Ambulatory Visit (HOSPITAL_COMMUNITY)
Admission: RE | Admit: 2024-09-04 | Discharge: 2024-09-04 | Disposition: A | Discharge status: Home / Self Care | Source: Ambulatory Visit | Attending: Internal Medicine | Admitting: Internal Medicine

## 2024-09-04 DIAGNOSIS — R600 Localized edema: Secondary | ICD-10-CM | POA: Diagnosis present

## 2024-09-07 NOTE — Telephone Encounter (Signed)
 Called to discuss negative DVT study.  The heterogeneous structure over incision is likely a resolving seroma.  I doubt the cystic structure in the groin is clinically significant but can be examined at follow-up.

## 2024-09-08 ENCOUNTER — Other Ambulatory Visit: Payer: Self-pay

## 2024-09-17 ENCOUNTER — Inpatient Hospital Stay: Admission: RE | Admit: 2024-09-17 | Discharge: 2024-09-17 | Attending: Family Medicine | Admitting: Family Medicine

## 2024-09-17 ENCOUNTER — Encounter

## 2024-09-17 DIAGNOSIS — N63 Unspecified lump in unspecified breast: Secondary | ICD-10-CM

## 2024-09-25 ENCOUNTER — Inpatient Hospital Stay: Attending: Hematology and Oncology | Admitting: Hematology and Oncology

## 2024-09-28 ENCOUNTER — Ambulatory Visit: Payer: Self-pay | Admitting: Student

## 2024-10-02 ENCOUNTER — Ambulatory Visit

## 2024-10-14 ENCOUNTER — Ambulatory Visit: Admitting: Student

## 2025-08-23 ENCOUNTER — Other Ambulatory Visit (HOSPITAL_COMMUNITY)
